# Patient Record
Sex: Female | Born: 1935
Health system: Southern US, Community
[De-identification: ages and names within clinical notes are randomized; demographics above are authoritative.]

## PROBLEM LIST (undated history)

## (undated) DIAGNOSIS — E7800 Pure hypercholesterolemia, unspecified: Secondary | ICD-10-CM

## (undated) DIAGNOSIS — H409 Unspecified glaucoma: Secondary | ICD-10-CM

## (undated) DIAGNOSIS — K922 Gastrointestinal hemorrhage, unspecified: Secondary | ICD-10-CM

## (undated) DIAGNOSIS — K219 Gastro-esophageal reflux disease without esophagitis: Secondary | ICD-10-CM

## (undated) DIAGNOSIS — I10 Essential (primary) hypertension: Secondary | ICD-10-CM

## (undated) HISTORY — DX: Pure hypercholesterolemia, unspecified: E78.00

## (undated) HISTORY — DX: Gastrointestinal hemorrhage, unspecified: K92.2

## (undated) HISTORY — PX: ABDOMINAL HYSTERECTOMY: SHX81

## (undated) HISTORY — PX: REVISION TOTAL HIP ARTHROPLASTY: SHX766

## (undated) HISTORY — PX: JOINT REPLACEMENT: SHX530

---

## 2000-10-17 ENCOUNTER — Ambulatory Visit (HOSPITAL_COMMUNITY): Admission: RE | Admit: 2000-10-17 | Discharge: 2000-10-17 | Payer: Self-pay | Admitting: Internal Medicine

## 2000-10-17 ENCOUNTER — Encounter: Payer: Self-pay | Admitting: Internal Medicine

## 2001-02-08 ENCOUNTER — Ambulatory Visit (HOSPITAL_COMMUNITY): Admission: RE | Admit: 2001-02-08 | Discharge: 2001-02-08 | Payer: Self-pay | Admitting: Gastroenterology

## 2001-12-11 ENCOUNTER — Ambulatory Visit (HOSPITAL_COMMUNITY): Admission: RE | Admit: 2001-12-11 | Discharge: 2001-12-11 | Payer: Self-pay | Admitting: Internal Medicine

## 2001-12-11 ENCOUNTER — Encounter: Payer: Self-pay | Admitting: Internal Medicine

## 2003-07-19 ENCOUNTER — Emergency Department (HOSPITAL_COMMUNITY): Admission: EM | Admit: 2003-07-19 | Discharge: 2003-07-19 | Payer: Self-pay | Admitting: Emergency Medicine

## 2004-01-07 ENCOUNTER — Emergency Department (HOSPITAL_COMMUNITY): Admission: EM | Admit: 2004-01-07 | Discharge: 2004-01-08 | Payer: Self-pay | Admitting: Emergency Medicine

## 2004-01-14 ENCOUNTER — Emergency Department (HOSPITAL_COMMUNITY): Admission: EM | Admit: 2004-01-14 | Discharge: 2004-01-14 | Payer: Self-pay | Admitting: Emergency Medicine

## 2004-03-17 ENCOUNTER — Encounter: Admission: RE | Admit: 2004-03-17 | Discharge: 2004-03-17 | Payer: Self-pay | Admitting: Gastroenterology

## 2007-04-24 ENCOUNTER — Encounter: Admission: RE | Admit: 2007-04-24 | Discharge: 2007-04-24 | Payer: Self-pay | Admitting: Internal Medicine

## 2007-08-22 ENCOUNTER — Encounter: Admission: RE | Admit: 2007-08-22 | Discharge: 2007-08-22 | Payer: Self-pay | Admitting: Orthopedic Surgery

## 2007-12-11 ENCOUNTER — Inpatient Hospital Stay (HOSPITAL_COMMUNITY): Admission: RE | Admit: 2007-12-11 | Discharge: 2007-12-14 | Payer: Self-pay | Admitting: Orthopedic Surgery

## 2008-03-18 ENCOUNTER — Encounter: Admission: RE | Admit: 2008-03-18 | Discharge: 2008-03-18 | Payer: Self-pay | Admitting: Orthopedic Surgery

## 2008-05-08 ENCOUNTER — Inpatient Hospital Stay (HOSPITAL_COMMUNITY): Admission: RE | Admit: 2008-05-08 | Discharge: 2008-05-13 | Payer: Self-pay | Admitting: Orthopedic Surgery

## 2008-05-13 ENCOUNTER — Ambulatory Visit: Payer: Self-pay | Admitting: Family Medicine

## 2008-05-13 DIAGNOSIS — E559 Vitamin D deficiency, unspecified: Secondary | ICD-10-CM | POA: Insufficient documentation

## 2008-05-13 DIAGNOSIS — K219 Gastro-esophageal reflux disease without esophagitis: Secondary | ICD-10-CM

## 2008-05-13 DIAGNOSIS — M169 Osteoarthritis of hip, unspecified: Secondary | ICD-10-CM | POA: Insufficient documentation

## 2008-05-27 ENCOUNTER — Encounter: Payer: Self-pay | Admitting: Family Medicine

## 2008-05-27 DIAGNOSIS — Z96649 Presence of unspecified artificial hip joint: Secondary | ICD-10-CM | POA: Insufficient documentation

## 2009-04-24 IMAGING — CR DG HIP COMPLETE 2+V*R*
3 series · 3 of 3 positions shown · non-contrast
Comparison: 12/11/2007

CLINICAL DATA: Right hip osteoarthritis, preop

RIGHT HIP - COMPLETE 2+ VIEW

[t pelvis a.p.]
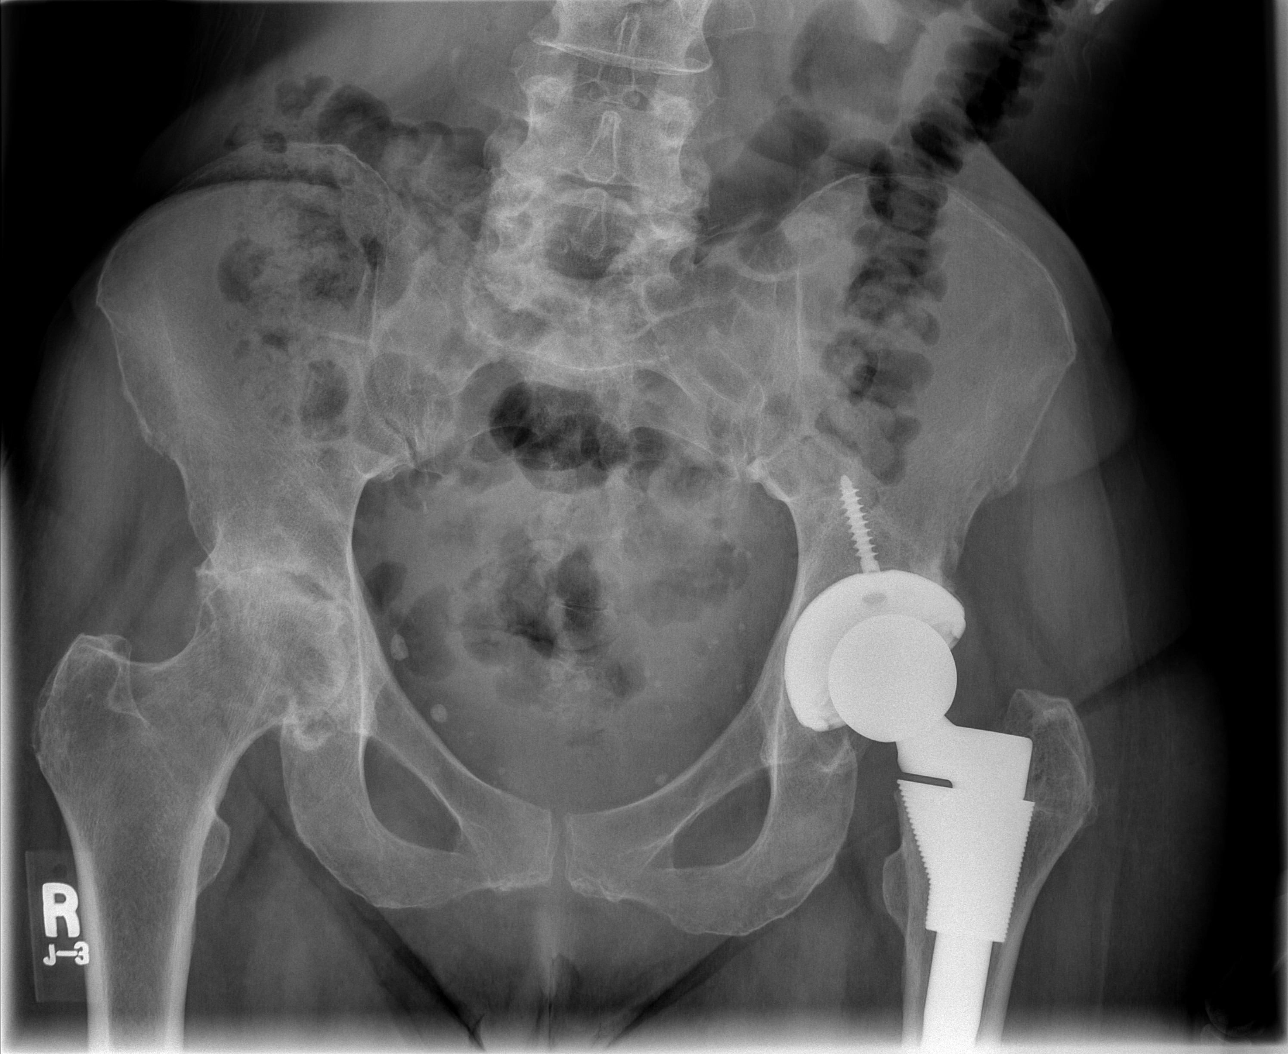

[t hip ap right]
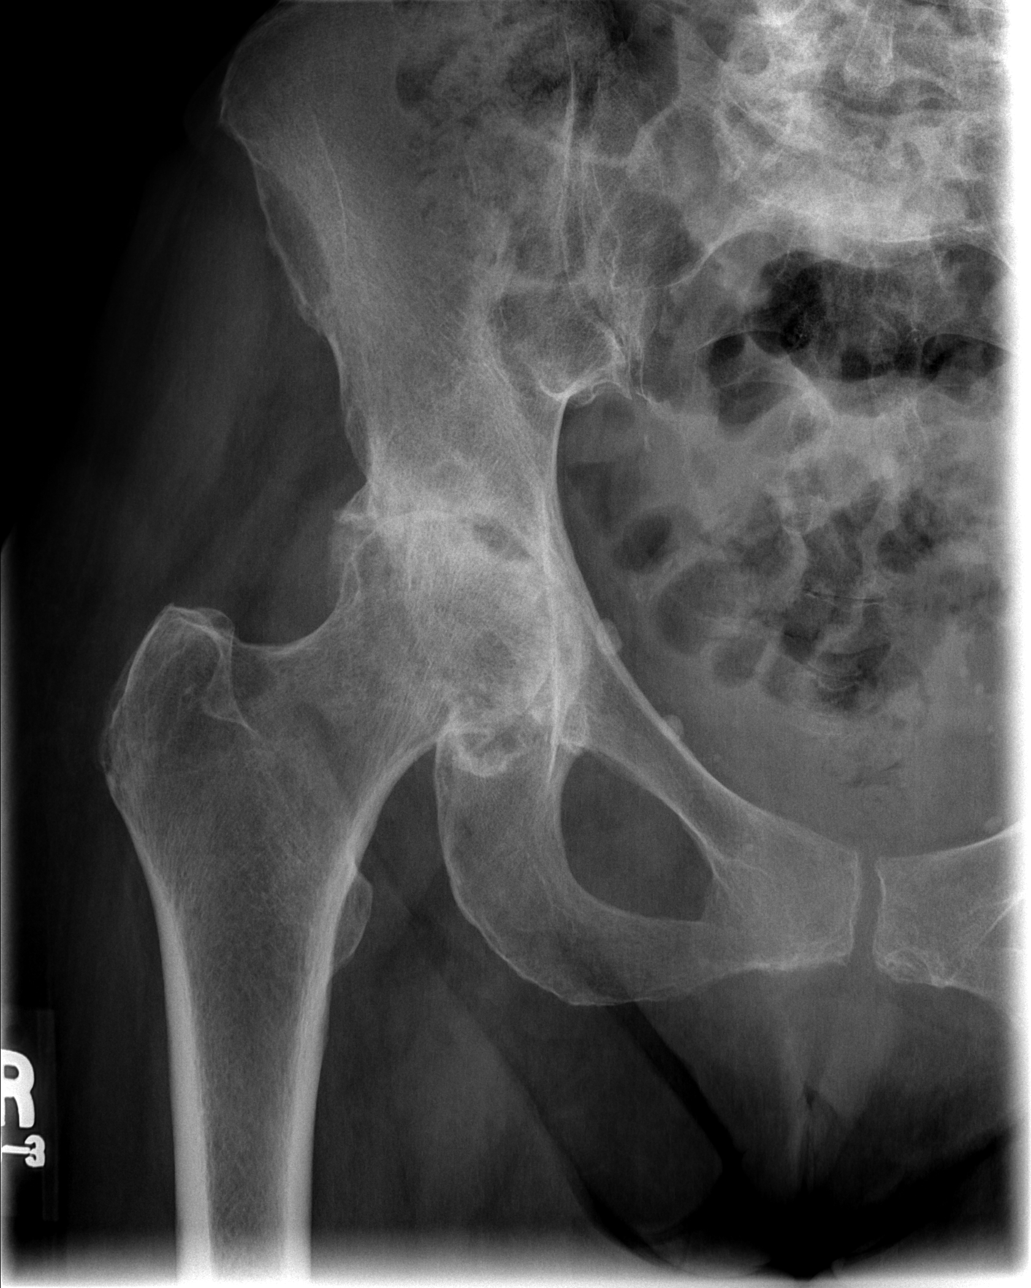

[t hip frog leg right]
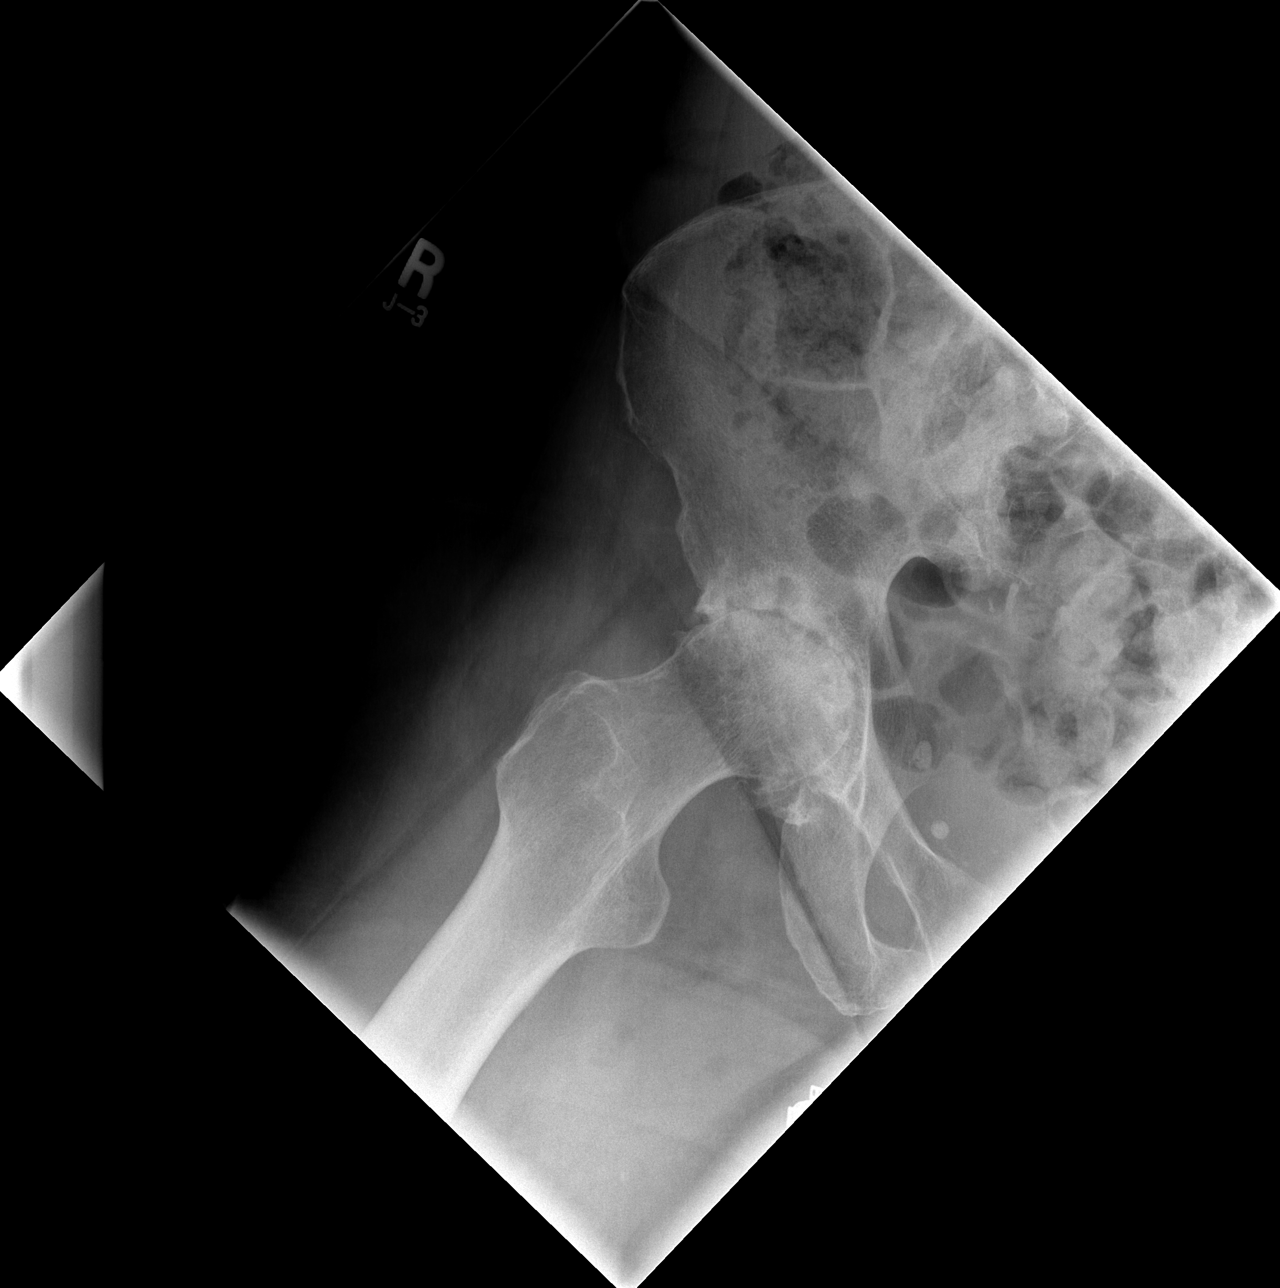

[3 of 3 positions shown; findings below may reference images not displayed]

FINDINGS: There is advanced loss of articular cartilage in the
right hip with near bone on bone apposition.  There is sclerosis in
the femoral head and acetabulum with some subchondral cysts
suggested in the femoral head.  Small marginal spurs from the
acetabulum.  Left hip arthroplasty components noted, femoral stem
incompletely visualized.  Bilateral pelvic phleboliths.  Negative
for fracture, dislocation, or other acute abnormality.  Mild
spondylitic changes in the lower lumbar spine.
IMPRESSION: 1.  Advanced right hip osteoarthritis.
2.  Left hip arthroplasty, incompletely assessed.

## 2009-08-31 ENCOUNTER — Emergency Department (HOSPITAL_COMMUNITY): Admission: EM | Admit: 2009-08-31 | Discharge: 2009-08-31 | Payer: Self-pay | Admitting: Emergency Medicine

## 2010-02-14 ENCOUNTER — Encounter: Payer: Self-pay | Admitting: Internal Medicine

## 2010-04-10 LAB — POCT CARDIAC MARKERS
Myoglobin, poc: 54 ng/mL (ref 12–200)
Troponin i, poc: <0.05 ng/mL (ref 0.00–0.09)

## 2010-04-10 LAB — CBC
HCT: 38.1 % (ref 36.0–46.0)
Hemoglobin: 12.7 g/dL (ref 12.0–15.0)
MCH: 27.5 pg (ref 26.0–34.0)
RBC: 4.62 MIL/uL (ref 3.87–5.11)
WBC: 4.9 10*3/uL (ref 4.0–10.5)

## 2010-04-10 LAB — DIFFERENTIAL
Basophils Absolute: 0 10*3/uL (ref 0.0–0.1)
Basophils Relative: 1 % (ref 0–1)
Eosinophils Absolute: 0.1 10*3/uL (ref 0.0–0.7)
Eosinophils Relative: 3 % (ref 0–5)
Lymphs Abs: 0.8 10*3/uL (ref 0.7–4.0)
Monocytes Absolute: 0.3 10*3/uL (ref 0.1–1.0)
Monocytes Relative: 6 % (ref 3–12)
Neutro Abs: 3.6 10*3/uL (ref 1.7–7.7)

## 2010-04-10 LAB — BASIC METABOLIC PANEL
BUN: 9 mg/dL (ref 6–23)
CO2: 29 mEq/L (ref 19–32)
Calcium: 9.1 mg/dL (ref 8.4–10.5)
Creatinine, Ser: 0.75 mg/dL (ref 0.4–1.2)
GFR calc non Af Amer: >60 mL/min (ref >60)
Sodium: 141 mEq/L (ref 135–145)

## 2010-05-06 LAB — BASIC METABOLIC PANEL
BUN: 3 mg/dL — ABNORMAL LOW (ref 6–23)
BUN: 4 mg/dL — ABNORMAL LOW (ref 6–23)
BUN: 7 mg/dL (ref 6–23)
CO2: 26 mEq/L (ref 19–32)
CO2: 28 mEq/L (ref 19–32)
Calcium: 8 mg/dL — ABNORMAL LOW (ref 8.4–10.5)
Calcium: 8.1 mg/dL — ABNORMAL LOW (ref 8.4–10.5)
Chloride: 108 mEq/L (ref 96–112)
Chloride: 108 mEq/L (ref 96–112)
Chloride: 102 mEq/L (ref 96–112)
Creatinine, Ser: 0.7 mg/dL (ref 0.4–1.2)
GFR calc Af Amer: >60 mL/min (ref >60)
GFR calc Af Amer: >60 mL/min (ref >60)
GFR calc non Af Amer: >60 mL/min (ref >60)
GFR calc non Af Amer: >60 mL/min (ref >60)
Glucose, Bld: 97 mg/dL (ref 70–99)
Glucose, Bld: 143 mg/dL — ABNORMAL HIGH (ref 70–99)
Potassium: 3.7 mEq/L (ref 3.5–5.1)
Sodium: 135 mEq/L (ref 135–145)
Sodium: 138 mEq/L (ref 135–145)
Sodium: 134 mEq/L — ABNORMAL LOW (ref 135–145)

## 2010-05-06 LAB — CBC
HCT: 29.4 % — ABNORMAL LOW (ref 36.0–46.0)
HCT: 25.8 % — ABNORMAL LOW (ref 36.0–46.0)
Hemoglobin: 9.9 g/dL — ABNORMAL LOW (ref 12.0–15.0)
Hemoglobin: 11.7 g/dL — ABNORMAL LOW (ref 12.0–15.0)
Hemoglobin: 8.6 g/dL — ABNORMAL LOW (ref 12.0–15.0)
MCHC: 31.8 g/dL (ref 30.0–36.0)
RBC: 3.63 MIL/uL — ABNORMAL LOW (ref 3.87–5.11)
RBC: 3.25 MIL/uL — ABNORMAL LOW (ref 3.87–5.11)
RBC: 4.64 MIL/uL (ref 3.87–5.11)
RDW: 16 % — ABNORMAL HIGH (ref 11.5–15.5)
WBC: 5.5 10*3/uL (ref 4.0–10.5)
WBC: 8 10*3/uL (ref 4.0–10.5)

## 2010-05-06 LAB — TYPE AND SCREEN
ABO/RH(D): B POS
Antibody Screen: NEGATIVE

## 2010-05-06 LAB — URINALYSIS, ROUTINE W REFLEX MICROSCOPIC
Bilirubin Urine: NEGATIVE
Glucose, UA: NEGATIVE mg/dL
Hgb urine dipstick: NEGATIVE
Protein, ur: NEGATIVE mg/dL
pH: 6.5 (ref 5.0–8.0)

## 2010-05-06 LAB — COMPREHENSIVE METABOLIC PANEL
ALT: 13 U/L (ref 0–35)
AST: 16 U/L (ref 0–37)
Alkaline Phosphatase: 81 U/L (ref 39–117)
BUN: 10 mg/dL (ref 6–23)
CO2: 29 mEq/L (ref 19–32)
Creatinine, Ser: 0.7 mg/dL (ref 0.4–1.2)
GFR calc Af Amer: >60 mL/min (ref >60)
Glucose, Bld: 97 mg/dL (ref 70–99)
Potassium: 3.3 mEq/L — ABNORMAL LOW (ref 3.5–5.1)
Sodium: 141 mEq/L (ref 135–145)
Total Protein: 6.5 g/dL (ref 6.0–8.3)

## 2010-05-06 LAB — PROTIME-INR
INR: 2.1 — ABNORMAL HIGH (ref 0.00–1.49)
INR: 2.2 — ABNORMAL HIGH (ref 0.00–1.49)
INR: 1.3 (ref 0.00–1.49)
Prothrombin Time: 25.1 seconds — ABNORMAL HIGH (ref 11.6–15.2)
Prothrombin Time: 26 seconds — ABNORMAL HIGH (ref 11.6–15.2)
Prothrombin Time: 26.7 seconds — ABNORMAL HIGH (ref 11.6–15.2)
Prothrombin Time: 13.1 seconds (ref 11.6–15.2)

## 2010-05-06 LAB — APTT: aPTT: 28 seconds (ref 24–37)

## 2010-06-09 NOTE — Discharge Summary (Signed)
Tammy, [name] NO.:  [id number]   MEDICAL RECORD NO.:  [id number]          PATIENT TYPE:  INP   LOCATION:  1618                         FACILITY:  [facility]   PHYSICIAN:  Tammy [name], M.D.    DATE OF BIRTH:  02-23-1933   DATE OF ADMISSION:  05/08/2008  DATE OF DISCHARGE:  05/13/2008                               DISCHARGE SUMMARY   ADMITTING DIAGNOSES:  1. Osteoarthritis right hip.  2. Hypertension.  3. Reflux disease.  4. Vitamin D deficiency.   DISCHARGE DIAGNOSES:  1. Osteoarthritis right hip, status post right total hip replacement      arthroplasty.  2. Acute postop blood loss anemia.  3. Status post transfusion without sequelae.  4. Mild postop hyponatremia, improved.  5. Hypertension.  6. Reflux disease.  7. Vitamin D deficiency.   PROCEDURE:  May 08, 2008:  Right total hip.  Surgeon Dr. Lequita [name],  assistant Tammy Julien Girt PA-C.  Anesthesia general.   CONSULTANTS:  None.   BRIEF HISTORY:  Tammy [name] is a 77 year old female with severe end-  stage osteoarthritis of the right hip, progressive worsening pain and  dysfunction who failed nonoperative management and now presents for  total hip arthroplasty.   LABORATORY DATA:  CBC on admission:  Hemoglobin 11.7, hematocrit 36.8,  white cell count 5.5, platelets 322.  Pro time/INR 13.1 and 1.0.  PTT of  28.  Chem panel on admission:  Minimally low potassium at 3.3.  Remaining chem panel all within normal limits.  Preop UA was negative.  Serial CBCs were followed.  Hemoglobin dropped down to 8.8, then 8.6,  then 8.1, got as low as 7.7 and was given 2 units of blood.  Post  transfusion hemoglobin back up to 9.9 with hematocrit 29.4.  Serial pro  times followed per Coumadin protocol.  Last noted PT/INR 24.6 and 2.1.  Serial basic metabolic panels were followed.  Potassium came into a  normal level and remained normal.  Sodium dropped from 141 to 134 and  back up to 138.   X-RAYS:  Right hip film,  May 01, 2008, advanced right hip  osteoarthritis and left hip arthroplasty.   EKG: Sinus bradycardia, no acute changes, was confirmed believe by Dr.  Renae [name].   HOSPITAL COURSE:  The patient was admitted to [facility],  taken to OR and underwent the above said procedure without complication.  The patient tolerated the procedure well and was later transferred to  the recovery room and then to orthopedic floor, started on PCA and p.o.  analgesics, given 24 hours of postop IV antibiotics.  Weaned over to  p.o. medications.  Had a little bit of drop in sodium so decreased the  fluids.  Blood pressure was running normal to low normal, so her blood  pressure medications were put on hold and also parameters; had decent  output.  Started to get up out of bed on day #1, weaned over to p.o.  medications.  By day #2, we had already gotten discharge planning  involved, social worker as she wanted look into a skilled nurse  facility.  She was doing a little bit better short and started walking  about 20 to 25 feet.  Dressing changed on day #2, incision looked good;  output was good.  Hemoglobin was down to 8.1.  She is asymptomatic with  this, we put her on some iron supplements.  Sodium had already started  to improve.  She was going to remain in the hospital through the  weekend. We rechecked the hemoglobin later that day.  Unfortunately it  dropped further down to 7.7.  We recommended blood.  Despite needing  blood, she was walking about 80 feet.  She wanted look into [facility]  facility.  She stayed through the weekend.  Hemoglobin rechecked on  postop day #3 was back up to 9.9, doing well.  Incision remained stable,  no signs of infection.  She received therapy on Saturday and Sunday and  on Monday morning, May 13, 2008, she was seen in rounds with Dr.  Lequita [name].  She was doing well.  Bed was available and she was transferred  out at that time.   DISCHARGE PLAN:  The patient was  transferred over to [facility].   DISCHARGE DIAGNOSES:  Please see above.   DISCHARGE MEDICATIONS:  1. Coumadin protocol.  Please titrate Coumadin level for target INR      between 2.0 and 3.0.  She is to be on Coumadin for 3 weeks from      date of surgery of May 08, 2008.  2. Colace 100 mg p.o. b.i.d.  3. Protonix 40 mg p.o. daily.  4. Norvasc 10 mg p.o. daily.  5. Azor 10/20 mg p.o. q.a.m.  6. Nu-Iron 150 mg p.o. b.i.d. for 3 weeks and then may stop the iron  7. Tylenol 325 mg 1 or 2 every 4-6 hours need for pain, mild      temperature or headache.  8. Laxative of choice.  9. Enema of choice.  10.Percocet 5 mg 1 or 2 every 4 hours needed for pain.  11.Robaxin 500 mg p.o. q.6 hours p.r.n. spasm.   DIET:  Low-sodium heart-healthy diet.   ACTIVITY:  She is partial weightbearing to the right lower extremity 25  to 50%, partial weightbearing hip precautions total hip protocol.  Gait  training, ambulation, ADLs, and needs to be up and out of bed minimum  b.i.d.  She may start showering; however do not submerge incision under  water.   DISPOSITION:  She is to be going to [place].   CONDITION ON DISCHARGE:  Improved.      Tammy [name], P.A.C.      Tammy [name], M.D.  Electronically Signed    ALP/MEDQ  D:  05/13/2008  T:  05/13/2008  Job:  [id number]   cc:   Tammy [name]. Tammy [name], M.D.  Fax: [phone]

## 2010-06-09 NOTE — H&P (Signed)
Tammy [name], [name] NO.:  [id number]   MEDICAL RECORD NO.:  [id number]        PATIENT TYPE:  LINP   LOCATION:                               FACILITY:  [facility]   PHYSICIAN:  Ollen Gross, M.D.    DATE OF BIRTH:  1933-12-26   DATE OF ADMISSION:  05/08/2008  DATE OF DISCHARGE:                              HISTORY & PHYSICAL   DATE OF OFFICE VISIT/HISTORY AND PHYSICAL:  May 03, 2008.   DATE OF ADMISSION:  May 08, 2008.   CHIEF COMPLAINT:  Right hip pain.   HISTORY OF PRESENT ILLNESS:  The patient is a 77 year old female who has  been seen by Dr. Lequita Halt for ongoing bilateral hip pain.  She has  undergone a previous left total hip back in November of 2009, doing well  with that.  The right hip continues to have pain.  She has reached the  point where she would like to have something done about it.  Risks and  benefits have been discussed.  She would like to proceed with surgery.   ALLERGIES:  CIPRO CAUSES HIVES.  UNASYN, SLEEPING PILL CAUSES LIP  TREMBLING.   INTOLERANCES:  TRAMADOL CAUSES SICKNESS.   CURRENT MEDICATIONS:  Azor, amlodipine, Premarin, calcium, Protonix.   PAST MEDICAL HISTORY:  Hypertension, reflux disease, vitamin D  deficiency.   PAST SURGICAL HISTORY:  Left total hip replacement December 11, 2007 and  also hysterectomy.   FAMILY HISTORY:  Father deceased of complications of Alzheimer's, head  injury and hypertension.  Mother living age 51 with hypertension and  congestive heart failure.   SOCIAL HISTORY:  The patient is a widow, retired, nonsmoker.  No  alcohol.  She lives alone.  She wants to look into a rehab facility.   REVIEW OF SYSTEMS:  GENERAL:  No fevers, chills, night sweats.  NEURO:  No seizures, syncope or paralysis.  RESPIRATORY:  No shortness breath,  productive cough or hemoptysis.  CARDIOVASCULAR:  No chest pain, angina,  orthopnea.  GI:  No nausea, vomiting, diarrhea, constipation.  GU:  No  dysuria, hematuria or  discharge.  MUSCULOSKELETAL:  Right hip.   PHYSICAL EXAM:  VITAL SIGNS:  Pulse 84, respirations 14, blood pressure  160/80.  GENERAL:  77 year old African American female, tall, slender frame, no  acute distress.  She is alert, oriented, cooperative, pleasant.  Good  historian.  HEENT:  Normocephalic, atraumatic.  Pupils are round and reactive.  EOMs  intact.  NECK:  Supple.  CHEST:  Clear.  HEART:  Regular rate and rhythm with a systolic ejection murmur 2/6 best  heard over tricuspid point.  ABDOMEN:  Soft, nontender.  Bowel sounds present.  RECTAL/BREASTS/GENITALIA:  Not done.  Not pertinent to present illness.  EXTREMITIES:  Right hip flexion 90 degrees, zero internal rotation, zero  external rotation, 0 degrees abduction.   IMPRESSION:  Osteoarthritis right hip.   PLAN:  The patient will be admitted to [facility] to undergo  a right total hip arthroplasty.  Surgery will be performed by Dr. Ollen Gross.      Alexzandrew L. Julien Girt,  P.A.C.      Ollen Gross, M.D.  Electronically Signed    ALP/MEDQ  D:  05/07/2008  T:  05/07/2008  Job:  [id number]   cc:   Ollen Gross, M.D.  Fax: [phone]   Merlene Laughter. Renae Gloss, M.D.  Fax: [phone]

## 2010-06-09 NOTE — Discharge Summary (Signed)
Tammy [name], Tammy [name] NO.:  [id number]   MEDICAL RECORD NO.:  [id number]          PATIENT TYPE:  INP   LOCATION:  1602                         FACILITY:  [facility]   PHYSICIAN:  Ollen Gross, M.D.    DATE OF BIRTH:  Jan 22, 1934   DATE OF ADMISSION:  12/11/2007  DATE OF DISCHARGE:  12/14/2007                               DISCHARGE SUMMARY   CHIEF COMPLAINT:  1. Osteoarthritis, left hip greater than right hip.  2. Hypertension,  3. Reflux disease.  4. Vitamin D deficiency.   DISCHARGE DIAGNOSES:  1. Osteoarthritis left hip status post left total replacement      arthroplasty.  2. Osteoarthritis right hip.  3. Mild postoperative blood loss anemia, did not require transfusion.  4. Postoperative hyponatremia, improved.  5. Postoperative hypokalemia, improved.   Remaining discharge diagnoses same as admitting.   PROCEDURE:  December 11, 2007 left total hip.   SURGEON:  Dr. Lequita Halt.   ASSISTANT:  Avel Peace, PA-C.   ANESTHESIA:  General.   CONSULTS:  None.   BRIEF HISTORY:  Tammy [name] is a 77 year old female with end-stage  arthritis of both hips, left more symptomatic than right.  Failed  nonoperative management, now presents for a total hip arthroplasty.   LABORATORY DATA:  Preop CBC showed hemoglobin 11.9, hematocrit 36.4,  white cell count 5.2, red cell count 4.3, platelets of 356.  Chem panel  on admission all within normal limits.  PT/INR preop 13.3 and 1 with a  PTT of 27.  Preop UA was negative.  Serial CBCs were followed.  Hemoglobin dropped down to 9.5 and then got as low as 8.9.  Follow-up  CBC showed hemoglobin 9.2, hematocrit 28.  Serial ProTimes followed per  Coumadin protocol, last known PT/INR 25.7 and 2.2.  Serial BMETs were  followed.  Sodium did drop down to 134, got as low as 132, came back up  to 139.  Potassium dropped a little bit down to 3.3, came back up to  4.3.  Glucose went up from 102 to 156, back down to 111.  Remaining  electrolytes remained within normal limits.   X-RAYS:  Chest X-Ray December 07, 2007:  No active disease.  Hip films  December 07, 2007:  Advanced OA bilateral hips, cannot exclude AVM.   EKG dated October 09, 2007:  Sinus bradycardia, no acute changes.   HOSPITAL COURSE:  The patient admitted to [facility],  tolerated procedure well, later transferred to recovery room on  orthopedic floor.  Started on PCA and p.o. analgesic pain control  following surgery.  Had a decent night with pain control, was doing  pretty well on the morning of day one.  Started getting up out of bed,  given 24 hours postoperative IV antibiotics.  Started on Coumadin for  DVT prophylaxis.  She had a little bit of fluid dilutional component to  her potassium and her sodium, potassium dropped to 3.3, sodium was down  to 134.  We decreased fluids, gave her a little oral potassium  supplement.  Blood pressure looked good so we put her amlodipine, blood  pressure on a parameter, resumed her on Protonix.  Started to get up out  of bed and walking short distance.  By day two she was up walking about  50 - 70 feet, doing well.  Dressing changed on day two and incision  looked excellent.  She had excellent output.  Hemoglobin was down a  little bit to 8.9 but she is asymptomatic with this.  Did put her on a  little bit of iron, the sodium was down to 132 but her potassium was  back up.  It was felt due to her home situation and progression she  would benefit from undergoing a skilled nurse facility.  She wanted look  into a bed, we got discharge planning involved.  They started looking  into a skilled facility for therapy.  By day three, December 14, 2007  the patient is doing well, tolerating her therapy, ready to go to a  skilled facility.   DISCHARGE PLAN:  The patient to be transferred to the skilled facility  of choice on December 14, 2007.   DISCHARGE DIAGNOSIS:  Please see above.   DISCHARGE  MEDICATIONS:  Current medications at time of transfer:  1. Coumadin, Coumadin protocol.  Please titrate Coumadin levels for      target INR between 2 and 3.  2. Colace 100 mg p.o. b.i.d.  3. Norvasc 10 mg p.o. daily, holding if systolic pressure less than      130.  4. Protonix 40 mg daily.  5. Nu-Iron 150 mg daily.  6. Robaxin 500 mg p.o. q.6 - 8 hours p.r.n. spasm.  7. Norco 10/325 one or two every 4 hours as needed for pain.   DIET:  Heart-healthy diet.   ACTIVITY:  She is partial weightbearing to the left lower extremity, 25%  - 50% partial weightbearing by hip precautions, total hip protocol.  She  is to continue with PT and OT for gait training, ambulation, ADLs.  She  may start showering, however, do not submerge the incision under water.   FOLLOW UP:  She is followed with Dr. Deri Fuelling office approximately 2  weeks from the date of surgery of December 11, 2007, please contact the  office at [phone] to arrange appointment time and follow-up with this  patient.   DISPOSITION:  Pending awaiting final bed offers.   CONDITION ON DISCHARGE:  Improving.      Alexzandrew L. Perkins, P.A.C.      Ollen Gross, M.D.  Electronically Signed    ALP/MEDQ  D:  12/14/2007  T:  12/14/2007  Job:  [id number]   cc:   Skilled Nursing Facility   [place] R. Renae Gloss, M.D.  Fax: [phone]   Darlin Priestly, MD  Fax: [phone]

## 2010-06-09 NOTE — Op Note (Signed)
Tammy [name], Tammy [name] NO.:  [id number]   MEDICAL RECORD NO.:  [id number]          PATIENT TYPE:  INP   LOCATION:  0006                         FACILITY:  [facility]   PHYSICIAN:  Ollen Gross, M.D.    DATE OF BIRTH:  [birth year]   DATE OF PROCEDURE:  05/08/2008  DATE OF DISCHARGE:                               OPERATIVE REPORT   PREOPERATIVE DIAGNOSIS:  Osteoarthritis, right hip.   POSTOPERATIVE DIAGNOSIS:  Osteoarthritis, right hip.   PROCEDURE:  Right total hip arthroplasty.   SURGEON:  Ollen Gross, M.D.   ASSISTANT:  Alexzandrew L. Perkins, P.A.C.   ANESTHESIA:  General.   ESTIMATED BLOOD LOSS:  400.   DRAIN:  Hemovac x1.   COMPLICATIONS:  None.   CONDITION:  Stable to recovery.   BRIEF CLINICAL NOTE:  Ms. Seyller is a 77 year old female with severe  end-stage arthritis of the right hip with progressively worsening pain  and dysfunction.  She has failed nonoperative management and presents  for right total hip arthroplasty.  She has had a recent successful left  total hip arthroplasty.   PROCEDURE IN DETAIL:  After successful administration of general  anesthetic, the patient is placed in the left lateral decubitus position  with the right side up and held with the hip positioner.  The right  lower extremity is isolated from her perineum with plastic drapes and  prepped and draped in the usual sterile fashion.  A short posterolateral  incision is made with a 10 blade through the subcutaneous tissue to the  level of the fascia lata which was incised in line with the skin  incision.  The sciatic nerve was palpated and protected and the short  external rotators were isolated off the femur.  Capsulectomy is  performed and the hip is dislocated.  The center of femoral head is  marked and a trial prosthesis placed such that the center of the trial  head corresponds to the center of the native femoral head.  Osteotomy  line is marked on the femoral neck  and osteotomy made with an  oscillating saw.  The femoral head is removed and the femur is retracted  anteriorly to gain acetabular exposure.   Acetabular retractors were placed and labrum and osteophytes removed.  Acetabular reaming is initiated at 47 mm coursing increments of 2 to 55  mm and a 56 mm Pinnacle acetabular shell was placed in anatomic position  with excellent purchase.  We did not need any additional provisional  screws.  The trial 36 mm neutral plus 4 liner was placed.   The femur is repaired with the canal finder and irrigation.  Axial  reaming is performed to 15.5 mm proximal reaming to a 44F and the sleeve  machined to a large.  A 44F large trial sleeve is placed with a 20 x 15  stem and a 36 plus 8 neck matching native anteversion.  The 36 plus zero  head is placed and the hip is reduced with outstanding stability.  There  was full extension, full external rotation, 70 degrees flexion, 40  degrees adduction and  90 degrees internal rotation and 90 degrees of  flexion and 70 degrees of internal rotation.  By placing the right leg  on top of the left, it was felt as though the leg lengths were equal.  The hip was then dislocated and all trials removed.  Permanent apex hole  eliminator is placed in the acetabular shell and permanent 36 mm neutral  plus 4 Marathon liner was placed.  On the femoral side we placed the  permanent 11F large sleeve and the 20 x 15 stem with 36 plus 8 neck  matching native anteversion.  The 36 plus zero head is placed and the  hip is reduced to the same stability parameters.  The wound is copiously  irrigated with saline solution and the short rotator is reattached to  femur through drill holes with Ethibond suture.  The fascia lata is  closed over Hemovac drain with interrupted #1 Vicryl, the subcu closed  with #1 and 2-0 Vicryl and subcuticular with running 4-0 Monocryl.  Drain is hooked to suction.  Incision cleaned and dried and  Steri-Strips  and bulky sterile dressing applied.  The drain is then hooked to suction  and she is placed into a knee immobilizer, awakened and transported to  recovery in stable condition.      Ollen Gross, M.D.  Electronically Signed     FA/MEDQ  D:  05/08/2008  T:  05/08/2008  Job:  [id number]

## 2010-06-09 NOTE — Op Note (Signed)
Tammy [name], Tammy [name] NO.:  [id number]   MEDICAL RECORD NO.:  [id number]          PATIENT TYPE:  INP   LOCATION:  0002                         FACILITY:  [facility]   PHYSICIAN:  Ollen Gross, M.D.    DATE OF BIRTH:  1933-08-23   DATE OF PROCEDURE:  12/11/2007  DATE OF DISCHARGE:                               OPERATIVE REPORT   PREOPERATIVE DIAGNOSIS:  Osteoarthritis, left hip.   POSTOPERATIVE DIAGNOSIS:  Osteoarthritis, left hip.   PROCEDURE:  Left total hip arthroplasty.   SURGEON:  Aluisio   ASSISTANT:  Avel Peace, PA-C   ANESTHESIA:  General.   ESTIMATED BLOOD LOSS:  500.   DRAINS:  Hemovac x1.   COMPLICATIONS:  None.   CONDITION:  Stable to Recovery.   BRIEF CLINICAL NOTE:  Tammy [name] is a 77 year old female with end-stage  arthritis of the left hip, with progressively worsening pain and  dysfunction.  She has failed nonoperative management and presents for  total hip arthroplasty.   PROCEDURE IN DETAIL:  After successful administration of general  anesthetic, the patient was placed in the right lateral decubitus  position with the left side up and held with the hip positioner.  The  left lower extremity was isolated from her perineum with plastic drapes,  and prepped and draped in the usual sterile fashion.  The short  posterolateral incision was then made with a 10 blade through  subcutaneous tissue to the level of the fascia lata which was incised in  line with the skin incision.  The sciatic nerve was palpated and  protected, and the short external rotator was isolated off the femur.  Capsulectomy was performed and the hip was dislocated.  The center of  femoral head was marked and the trial prosthesis was placed such that  the center of the trial head corresponded to the center of the native  femoral head.  The osteotomy line was marked on the femoral neck and  osteotomy made with an oscillating saw.  The femoral head was removed  and  then the femur was retracted anteriorly to gain acetabular exposure.   Acetabular retractors were placed the labrum and osteophytes removed.  Acetabular reaming began at 45 mm coursing in increments of 2 to 55 mm  and then a 56-mm Pinnacle acetabular shell was placed in anatomic  position and transfixed with a dome screw.  The trial 36 mm neutral +4  liner was placed.   The femur was prepared with the canal finder and irrigation.  Axial  reaming was performed to 15.5 mm, proximal reaming to a 78F, and the  sleeve machined to a large.  A 78F large trial sleeve was placed with a  20 x 15 stem and 36 mm +8 neck matching native anteversion.  A 36 mm +0  head was placed and the hip was reduced with outstanding stability.  There was full extension, full external rotation, 70 degrees flexion, 40  degrees adduction, 90 degrees external rotation, 90 degrees of flexion,  and 70 degrees of internal rotation.  By placing the left leg on top of  the right, I felt as though the leg lengths were equal.  The hip was  then dislocated and all trials were removed.  The permanent apex hole  eliminator was placed and the permanent 36 mm neutral +4 Marathon liner  was placed.  On the femoral side, we did the 20D large sleeve with a 20  x 15 stem and 36 mm +8 neck matching native anteversion.  A 36.0 head  was placed and the hips reduced with the same stability parameters.  The  wound was copiously irrigated with saline solution and short rotators  reattached to the femur through drill holes.  Fascia lata was closed  over Hemovac drain with interrupted #1 Vicryl.  The subcu was closed  with 1-0 and 2-0 Vicryl, and subcuticular running 4-0 Monocryl.  The  drain was hooked to suction.  The incision was cleaned and dried, and  Steri-Strips and a bulky sterile dressing applied.  She was placed into  a knee immobilizer, awakened, and transported to Recovery in stable  condition.      Ollen Gross, M.D.   Electronically Signed     FA/MEDQ  D:  12/11/2007  T:  12/12/2007  Job:  [id number]

## 2010-06-12 NOTE — H&P (Signed)
Tammy [name], Tammy [name] NO.:  [id number]   MEDICAL RECORD NO.:  [id number]          PATIENT TYPE:  INP   LOCATION:                               FACILITY:  [facility]   PHYSICIAN:  Ollen Gross, M.D.    DATE OF BIRTH:  April 20, 1933   DATE OF ADMISSION:  12/11/2007  DATE OF DISCHARGE:                              HISTORY & PHYSICAL   CHIEF COMPLAINT:  Bilateral hip pain.   HISTORY OF PRESENT ILLNESS:  The patient is 77 year old female with  bilateral hip pain, left significantly worse than right.  She has pain  with range of motion.  She has not noticed that she has significantly  decreased range of motion.  She has pain with weightbearing.  She is  ambulating currently with the use of a cane, it has progressed over  time.  X-ray shows she has significant arthritic changes of the left hip  and advanced arthritic changes of the right hip.  The patient has  elected to proceed with a total hip arthroplasty as intra-articular  injections have not given her any relief.   ALLERGIES:  CIPRO CAUSING HIVES.   PRIMARY CARE PHYSICIAN:  Dr. Andi Devon.   CARDIOLOGIST:  Dr. Jenne Campus.   CURRENT MEDICATIONS:  1. Amlodipine 10 mg a day.  2. Premarin 0.625 mg a day.  3. Calcium 1 or 2 tablets a day.  4. Vitamin D prescription 50,000 units once a week.  5. Protonix 40 mg a day.   PRESENT MEDICAL HISTORY:  1. Hypertension.  2. Reflux disease.  3. Vitamin D deficiency.  4. Upper and lower partial dentures.  5. Stress test 3 years previous, normal evaluation.   REVIEW OF SYSTEMS:  Negative for any neurologic or pulmonary issues.  CARDIOVASCULAR:  She has stable hypertension, she denies any chest  pains, discomfort or irregular heart rhythms.  GASTROINTESTINAL:  She is  well-controlled with her current Protonix with her reflux, no other  issues.  She denies any urinary, endocrine or hematologic problems.   PAST SURGICAL HISTORY:  Hysterectomy without any complications.   FAMILY MEDICAL HISTORY:  Father is deceased from complications of  Alzheimer's and a head injury, he did have hypertension.  Mother is  alive at 61 years of age with hypertension and CHF.   SOCIAL HISTORY:  Patient is widowed, she is retired.  She has never  smoked, no alcohol.  She lives alone in a 2 level town house.  She will  have her niece helping her at home.  She may require skilled nursing.   PHYSICAL EXAM:  VITALS:  Height is 5 foot 7, weight is 135 pounds, blood  pressure is 140/82, pulse of 74, respirations 12, patient is afebrile.  GENERAL:  This is a healthy-appearing, slender, tall female conscious,  alert and appropriate.  She does walk with a very shuffled-type gait  with a cane in her right hand.  HEENT:  Head was normocephalic, pupils equal, round and reactive; gross  hearing is intact.  NECK:  Supple, no palpable lymphadenopathy, good range of motion.  CHEST:  Lung sounds were clear  and equal bilaterally.  No wheezes,  rales, rhonchi.  HEART:  Regular rate and rhythm.  ABDOMEN:  Soft, nontender, bowel sounds present.  EXTREMITIES:  Upper extremities had excellent range of motion.  Lower  Extremities: Left hip had full extension, she was only able to flex it  up to 90 degrees.  She had about 10 degrees of internal rotation, zero  degrees of external rotation.  Right hip she was able to fully extend,  she could flex it up to about 100 degrees.  She had about 15 degrees of  internal rotation, maybe 5 degrees of external rotation.  Both knees  were fully extended, she could flex them back to 120 degrees, no  instability.  Both ankles had good motion.  PERIPHERAL VASCULAR:  Carotid pulses were 2+, no bruits.  Radial pulses  were 2+, dorsalis pedis pulses were 1+.  She had no lower extremity  edema.  NEURO:  The patient was conscious, alert and appropriate.  No gross  neurologic defects noted.   IMPRESSION:  1. 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  2.  Hypertension.  3. Reflux disease.  4. Vitamin D deficiency.  5. Partial upper and lower dentures.  6. Stress test 3 years previous, reported normal.   PLAN:  The patient has been okayed for this upcoming surgical procedure  with continuation of her current medications by Dr. Andi Devon.  The patient will undergo all routine labs and tests prior to having a  left total hip arthroplasty by Dr. Despina Hick at [facility] on  December 11, 2007.      Jamelle Rushing, P.A.      Ollen Gross, M.D.  Electronically Signed    RWK/MEDQ  D:  11/14/2007  T:  11/15/2007  Job:  [id number]

## 2010-06-12 NOTE — Procedures (Signed)
[place]  Patient:    Tammy [name], Tammy [name] Visit Number: [id number] MRN: [id number]          Service Type: END Location: ENDO Attending Physician:  Charna Elizabeth Dictated by:   Anselmo Rod, M.D. Proc. Date: 02/08/01 Admit Date:  02/08/2001   CC:         [name] R. Renae Gloss, M.D.   Procedure Report  DATE OF BIRTH:  [birth year]  REFERRING PHYSICIAN:  Merlene Laughter. Renae Gloss, M.D.  PROCEDURE PERFORMED:  Colonoscopy.  ENDOSCOPIST:  Anselmo Rod, M.D.  INSTRUMENT USED:  Olympus video colonoscope.  INDICATIONS FOR PROCEDURE:  The patient is a 77 year old African-American female undergoing a screening colonoscopy.  The patient has had some rectal bleeding, rule out colonic polyps, masses, hemorrhoids etc.  PREPROCEDURE PREPARATION:  Informed consent was procured from the patient. The patient was fasted for eight hours prior to the procedure and prepped with a bottle of magnesium citrate and a gallon of NuLytely the night prior to the procedure.  PREPROCEDURE PHYSICAL:  The patient had stable vital signs.  Neck supple. Chest clear to auscultation.  S1, S2 regular.  Abdomen soft with normal abdominal bowel sounds.  DESCRIPTION OF PROCEDURE:  The patient was placed in the left lateral decubitus position and sedated with 50 mg of Demerol and 5 mg of Versed intravenously.  Once the patient was adequately sedated and maintained on low-flow oxygen and continuous cardiac monitoring, the Olympus video colonoscope was advanced from the rectum to the cecum without difficulty. Except for small nonbleeding internal hemorrhoids and a few early left-sided diverticula, no other abnormalities were seen.  The patient tolerated the procedure well without complications.  The appendicular orifice and ileocecal valve were clearly visualized and photographed.  No masses or polyps were seen.  IMPRESSION: 1. Few early left-sided diverticula. 2. Small  nonbleeding internal hemorrhoid.  RECOMMENDATIONS: 1. High fiber diet has been advised for the patient. 2. Outpatient follow-up in the next three to four weeks. 3. Repeat colorectal cancer screening in the next five years or earlier    unless the patient were to develop any abnormal symptoms in the interim. Dictated by:   Anselmo Rod, M.D. Attending Physician:  Charna Elizabeth DD:  02/08/01 TD:  02/08/01 Job: [id number]

## 2010-10-27 LAB — BASIC METABOLIC PANEL
Calcium: 8.2 — ABNORMAL LOW
Chloride: 103
Creatinine, Ser: 0.67
GFR calc Af Amer: >60
GFR calc Af Amer: >60
GFR calc non Af Amer: >60
GFR calc non Af Amer: >60
Glucose, Bld: 111 — ABNORMAL HIGH
Potassium: 3.9
Potassium: 4.3
Sodium: 132 — ABNORMAL LOW
Sodium: 134 — ABNORMAL LOW
Sodium: 139

## 2010-10-27 LAB — CBC
HCT: 28 — ABNORMAL LOW
HCT: 36.4
Hemoglobin: 9.2 — ABNORMAL LOW
Hemoglobin: 9.5 — ABNORMAL LOW
MCV: 83
MCV: 83.1
Platelets: 356
RBC: 3.19 — ABNORMAL LOW
RBC: 3.42 — ABNORMAL LOW
RBC: 4.38
RDW: 13.2
WBC: 11.6 — ABNORMAL HIGH
WBC: 5.2
WBC: 9.2
WBC: 9.4

## 2010-10-27 LAB — COMPREHENSIVE METABOLIC PANEL
AST: 17
Albumin: 3.7
Alkaline Phosphatase: 85
BUN: 9
CO2: 30
Chloride: 104
GFR calc Af Amer: >60
GFR calc non Af Amer: >60
Potassium: 3.6
Total Bilirubin: 0.5

## 2010-10-27 LAB — ABO/RH: ABO/RH(D): B POS

## 2010-10-27 LAB — URINALYSIS, ROUTINE W REFLEX MICROSCOPIC
Glucose, UA: NEGATIVE
Nitrite: NEGATIVE
Specific Gravity, Urine: 1.019
pH: 6.5

## 2010-10-27 LAB — TYPE AND SCREEN

## 2010-10-27 LAB — PROTIME-INR
INR: 1.1
Prothrombin Time: 21 — ABNORMAL HIGH

## 2011-02-23 DIAGNOSIS — Z79899 Other long term (current) drug therapy: Secondary | ICD-10-CM | POA: Diagnosis not present

## 2011-02-23 DIAGNOSIS — K219 Gastro-esophageal reflux disease without esophagitis: Secondary | ICD-10-CM | POA: Diagnosis not present

## 2011-02-23 DIAGNOSIS — I10 Essential (primary) hypertension: Secondary | ICD-10-CM | POA: Diagnosis not present

## 2011-03-29 DIAGNOSIS — Z1231 Encounter for screening mammogram for malignant neoplasm of breast: Secondary | ICD-10-CM | POA: Diagnosis not present

## 2011-04-01 ENCOUNTER — Encounter (HOSPITAL_COMMUNITY): Payer: Self-pay | Admitting: Emergency Medicine

## 2011-04-01 ENCOUNTER — Other Ambulatory Visit: Payer: Self-pay

## 2011-04-01 ENCOUNTER — Emergency Department (HOSPITAL_COMMUNITY)
Admission: EM | Admit: 2011-04-01 | Discharge: 2011-04-01 | Disposition: A | Discharge status: Home / Self Care | Payer: Medicare Other | Attending: Emergency Medicine | Admitting: Emergency Medicine

## 2011-04-01 ENCOUNTER — Emergency Department (HOSPITAL_COMMUNITY): Payer: Medicare Other

## 2011-04-01 DIAGNOSIS — R079 Chest pain, unspecified: Secondary | ICD-10-CM | POA: Insufficient documentation

## 2011-04-01 DIAGNOSIS — R112 Nausea with vomiting, unspecified: Secondary | ICD-10-CM | POA: Diagnosis not present

## 2011-04-01 DIAGNOSIS — I517 Cardiomegaly: Secondary | ICD-10-CM | POA: Diagnosis not present

## 2011-04-01 DIAGNOSIS — I10 Essential (primary) hypertension: Secondary | ICD-10-CM | POA: Insufficient documentation

## 2011-04-01 DIAGNOSIS — R1013 Epigastric pain: Secondary | ICD-10-CM | POA: Diagnosis not present

## 2011-04-01 HISTORY — DX: Gastro-esophageal reflux disease without esophagitis: K21.9

## 2011-04-01 HISTORY — DX: Essential (primary) hypertension: I10

## 2011-04-01 LAB — URINALYSIS, ROUTINE W REFLEX MICROSCOPIC
Glucose, UA: NEGATIVE mg/dL
Hgb urine dipstick: NEGATIVE
Specific Gravity, Urine: 1.017 (ref 1.005–1.030)
pH: 7 (ref 5.0–8.0)

## 2011-04-01 LAB — COMPREHENSIVE METABOLIC PANEL
ALT: 9 U/L (ref 0–35)
AST: 15 U/L (ref 0–37)
Calcium: 9.2 mg/dL (ref 8.4–10.5)
Sodium: 138 mEq/L (ref 135–145)
Total Protein: 6.9 g/dL (ref 6.0–8.3)

## 2011-04-01 LAB — CBC
HCT: 37.9 % (ref 36.0–46.0)
MCV: 84.8 fL (ref 78.0–100.0)
Platelets: 274 10*3/uL (ref 150–400)
RBC: 4.47 MIL/uL (ref 3.87–5.11)
WBC: 4.7 10*3/uL (ref 4.0–10.5)

## 2011-04-01 LAB — CARDIAC PANEL(CRET KIN+CKTOT+MB+TROPI)
CK, MB: 1.9 ng/mL (ref 0.3–4.0)
Relative Index: INVALID (ref 0.0–2.5)
Total CK: 48 U/L (ref 7–177)

## 2011-04-01 LAB — DIFFERENTIAL
Eosinophils Relative: 1 % (ref 0–5)
Lymphocytes Relative: 18 % (ref 12–46)
Lymphs Abs: 0.9 10*3/uL (ref 0.7–4.0)
Monocytes Absolute: 0.4 10*3/uL (ref 0.1–1.0)

## 2011-04-01 MED ORDER — MORPHINE SULFATE 4 MG/ML IJ SOLN
2.0000 mg | Freq: Once | INTRAMUSCULAR | Status: DC
  Filled 2011-04-01: qty 1

## 2011-04-01 MED ORDER — SODIUM CHLORIDE 0.9 % IV BOLUS (SEPSIS)
1000.0000 mL | Freq: Once | INTRAVENOUS | Status: AC
  Administered 2011-04-01: 1000 mL via INTRAVENOUS

## 2011-04-01 MED ORDER — ONDANSETRON HCL 4 MG/2ML IJ SOLN
4.0000 mg | Freq: Once | INTRAMUSCULAR | Status: AC
  Administered 2011-04-01: 4 mg via INTRAVENOUS
  Filled 2011-04-01: qty 2

## 2011-04-01 MED ORDER — ONDANSETRON HCL 4 MG PO TABS: 4.0000 mg | ORAL_TABLET | Freq: Four times a day (QID) | ORAL | Status: AC

## 2011-04-01 MED ORDER — FAMOTIDINE IN NACL 20-0.9 MG/50ML-% IV SOLN
20.0000 mg | Freq: Once | INTRAVENOUS | Status: AC
  Administered 2011-04-01: 20 mg via INTRAVENOUS
  Filled 2011-04-01: qty 50

## 2011-04-01 NOTE — ED Notes (Signed)
Per pt nausea, diarrhea on and off for a week-generalized weakness-no abdominal pain

## 2011-04-01 NOTE — ED Notes (Signed)
Pt wishes not to take iv morphine, states she doesn't like taking pain meds-not experiencing any pain at this time-will reassess at later time

## 2011-04-01 NOTE — ED Provider Notes (Addendum)
History     CSN: [id number]  Arrival date & time 04/01/11  4098   First MD Initiated Contact with Patient 04/01/11 0809      Chief Complaint  Patient presents with  . Nausea   This is a pleasant, 78 year old female. She has a history of hypertension, and acid reflux. Her patient states she began having diarrhea and nausea. Over the weekend. This continued intermittently for the past 3-4 days. She then began having increasing nausea and epigastric discomfort for past 3 days. Yesterday. The pain radiates into the substernal area. It is nonexertional. There is no diaphoresis or dizziness. Just continues to have persistent nausea, diarrhea, and epigastric discomfort. She did not try any medications at home. She has had no sick contacts. No recent travel. No fevers. No cough or dysuria. No reported hematemesis, or melena. She's had no back pain (Consider location/radiation/quality/duration/timing/severity/associated sxs/prior treatment) HPI  Past Medical History  Diagnosis Date  . Hypertension   . Acid reflux     Past Surgical History  Procedure Date  . Abdominal hysterectomy   . Joint replacement     No family history on file.  History  Substance Use Topics  . Smoking status: Not on file  . Smokeless tobacco: Never Used  . Alcohol Use: No    OB History    Grav Para Term Preterm Abortions TAB SAB Ect Mult Living                  Review of Systems  All other systems reviewed and are negative.    Allergies  Ciprofloxacin and Zolpidem tartrate  Home Medications  No current outpatient prescriptions on file.  BP 156/77  Pulse 80  Temp(Src) 98.2 F (36.8 C) (Oral)  Resp 16  Ht 5\' 7"  (1.702 m)  Wt 146 lb (66.225 kg)  BMI 22.87 kg/m2  Physical Exam  Nursing note and vitals reviewed. Constitutional: She is oriented to person, place, and time. She appears well-developed and well-nourished. No distress.       Calm comfortable, cooperative, and jovial, no acute  distress  HENT:  Head: Normocephalic and atraumatic.  Mouth/Throat: Oropharynx is clear and moist.  Eyes: Conjunctivae and EOM are normal. Pupils are equal, round, and reactive to light.  Neck: Neck supple. No JVD present. No tracheal deviation present. No thyromegaly present.  Cardiovascular: Normal rate and regular rhythm.  Exam reveals no gallop and no friction rub.   No murmur heard. Pulmonary/Chest: Breath sounds normal. No respiratory distress. She has no wheezes. She has no rales. She exhibits no tenderness.  Abdominal: Soft. Bowel sounds are normal. She exhibits no distension. There is tenderness. There is no rebound and no guarding.       Minimal epigastric tenderness to deep palpation. No significant right upper quadrant tenderness.  Musculoskeletal: Normal range of motion. She exhibits no edema and no tenderness.       No calf tenderness or swelling  Lymphadenopathy:    She has no cervical adenopathy.  Neurological: She is alert and oriented to person, place, and time. No cranial nerve deficit. Coordination normal.  Skin: Skin is warm and dry. No rash noted. She is not diaphoretic.  Psychiatric: She has a normal mood and affect.    ED Course  Procedures (including critical care time)   Labs Reviewed  CARDIAC PANEL(CRET KIN+CKTOT+MB+TROPI)  COMPREHENSIVE METABOLIC PANEL  LIPASE, BLOOD  URINALYSIS, ROUTINE W REFLEX MICROSCOPIC   No results found.   No diagnosis found.  MDM  Patient is seen and examined, initial history and physical is completed. Evaluation initiated  Treatment for nausea and vomiting, as well as hydration.  Slight bradycardia is noted, not symptomatic.        Riely Oetken A. Patrica Duel, MD 04/01/11 0819   Date: 04/01/2011  Rate: 60  Rhythm: sinus bradycardia  QRS Axis: left  Intervals: normal  ST/T Wave abnormalities: Nonspecific ST and T changes.  Conduction Disutrbances:left anterior fascicular block  Narrative Interpretation:   Old EKG  Reviewed: none available  Results for orders placed during the hospital encounter of 08/31/09  BASIC METABOLIC PANEL      Component Value Range   Sodium 141  135 - 145 (mEq/L)   Potassium 4.4  3.5 - 5.1 (mEq/L)   Chloride 107  96 - 112 (mEq/L)   CO2 29  19 - 32 (mEq/L)   Glucose, Bld 98  70 - 99 (mg/dL)   BUN 9  6 - 23 (mg/dL)   Creatinine, Ser 1.61  0.4 - 1.2 (mg/dL)   Calcium 9.1  8.4 - 09.6 (mg/dL)   GFR calc non Af Amer >60  >60 (mL/min)   GFR calc Af Amer    >60 (mL/min)   Value: >60            The eGFR has been calculated     using the MDRD equation.     This calculation has not been     validated in all clinical     situations.     eGFR's persistently     <60 mL/min signify     possible Chronic Kidney Disease.  CBC      Component Value Range   WBC 4.9  4.0 - 10.5 (K/uL)   RBC 4.62  3.87 - 5.11 (MIL/uL)   Hemoglobin 12.7  12.0 - 15.0 (g/dL)   HCT 04.5  40.9 - 81.1 (%)   MCV 82.5  78.0 - 100.0 (fL)   MCH 27.5  26.0 - 34.0 (pg)   MCHC 33.4  30.0 - 36.0 (g/dL)   RDW 91.4  78.2 - 95.6 (%)   Platelets 278  150 - 400 (K/uL)  DIFFERENTIAL      Component Value Range   Neutrophils Relative 74  43 - 77 (%)   Neutro Abs 3.6  1.7 - 7.7 (K/uL)   Lymphocytes Relative 16  12 - 46 (%)   Lymphs Abs 0.8  0.7 - 4.0 (K/uL)   Monocytes Relative 6  3 - 12 (%)   Monocytes Absolute 0.3  0.1 - 1.0 (K/uL)   Eosinophils Relative 3  0 - 5 (%)   Eosinophils Absolute 0.1  0.0 - 0.7 (K/uL)   Basophils Relative 1  0 - 1 (%)   Basophils Absolute 0.0  0.0 - 0.1 (K/uL)  POCT CARDIAC MARKERS      Component Value Range   Myoglobin, poc 54.0  12 - 200 (ng/mL)   CKMB, poc <1.0 (*) 1.0 - 8.0 (ng/mL)   Troponin i, poc <0.05  0.00 - 0.09 (ng/mL)   Comment       Value:            TROPONIN VALUES IN THE RANGE     OF 0.00-0.09 ng/mL SHOW     NO INDICATION OF     MYOCARDIAL INJURY.                PERSISTENTLY INCREASED TROPONIN     VALUES IN THE RANGE OF 0.10-0.24  ng/mL CAN BE SEEN IN:            -UNSTABLE ANGINA           -CONGESTIVE HEART FAILURE           -MYOCARDITIS           -CHEST TRAUMA           -ARRYHTHMIAS           -LATE PRESENTING MI           -COPD       CLINICAL FOLLOW-UP RECOMMENDED.                TROPONIN VALUES >=0.25 ng/mL     INDICATE POSSIBLE MYOCARDIAL     ISCHEMIA. SERIAL TESTING     RECOMMENDED.   Dg Chest 2 View  04/01/2011  *RADIOLOGY REPORT*  Clinical Data: Epigastric and chest pain.  CHEST - 2 VIEW 04/01/2011:  Comparison: Portable chest x-ray 08/31/2009 and two-view chest x- ray 12/07/2007 [facility].  Findings: Cardiac silhouette moderately enlarged, with interval increase in size since 2009.  Thoracic aorta mildly tortuous and atherosclerotic, unchanged.  Hilar and mediastinal contours otherwise unremarkable.  Lungs clear.  Pulmonary vascularity normal without evidence of pulmonary edema.  No pleural effusions. Visualized bony thorax intact.  IMPRESSION: Cardiomegaly.  No acute cardiopulmonary disease.  Original Report Authenticated By: Arnell Sieving, M.D.      The patient is feeling much better at this time.  Her initial laboratory studies were unremarkable. CBC was normal. Cardiac markers were normal. Urine was negative for ketones, negative for nitrite or leukocytes. Comprehensive metabolic panel essentially normal. Lipase was normal. Chest x-ray showed cardiomegaly, otherwise, no acute disease. EKG was repeated it showed sinus rhythm with a rate of approximately 50 beats per minute.  Appears to be at her baseline. Heart rate is in the 60s on the monitor. In the room during the evaluation   Date: 04/01/2011  Rate: 48  Rhythm: sinus bradycardia  QRS Axis: left  Intervals: normal  ST/T Wave abnormalities: nonspecific ST/T changes  Conduction Disutrbances:none  Narrative Interpretation:   Old EKG Reviewed: unchanged       Chabely Norby A. Patrica Duel, MD 04/01/11 1149   Patient will be given trial of fluids in diet and ambulation,  and likely will be stable for discharge home with instructions to followup with her primary care physician later this week to be rechecked.  Kazmir Oki A. Patrica Duel, MD 04/01/11 1150  Mylo Driskill A. Patrica Duel, MD 04/01/11 1156

## 2011-04-01 NOTE — Discharge Instructions (Signed)
Abdominal Pain Abdominal pain can be caused by many things. Your caregiver decides the seriousness of your pain by an examination and possibly blood tests and X-rays. Many cases can be observed and treated at home. Most abdominal pain is not caused by a disease and will probably improve without treatment. However, in many cases, more time must pass before a clear cause of the pain can be found. Before that point, it may not be known if you need more testing, or if hospitalization or surgery is needed. HOME CARE INSTRUCTIONS   Do not take laxatives unless directed by your caregiver.   Take pain medicine only as directed by your caregiver.   Only take over-the-counter or prescription medicines for pain, discomfort, or fever as directed by your caregiver.   Try a clear liquid diet (broth, tea, or water) for as long as directed by your caregiver. Slowly move to a bland diet as tolerated.  SEEK IMMEDIATE MEDICAL CARE IF:   The pain does not go away.   You have a fever.   You keep throwing up (vomiting).   The pain is felt only in portions of the abdomen. Pain in the right side could possibly be appendicitis. In an adult, pain in the left lower portion of the abdomen could be colitis or diverticulitis.   You pass bloody or black tarry stools.  MAKE SURE YOU:   Understand these instructions.   Will watch your condition.   Will get help right away if you are not doing well or get worse.  Document Released: 10/21/2004 Document Revised: 12/31/2010 Document Reviewed: 08/30/2007 [facility] Patient Information 2012 [place], [place].Nausea and Vomiting Nausea means you feel sick to your stomach. Throwing up (vomiting) is a reflex where stomach contents come out of your mouth. HOME CARE   Take medicine as told by your doctor.   Do not force yourself to eat. However, you do need to drink fluids.   If you feel like eating, eat a normal diet as told by your doctor.   Eat rice, wheat, potatoes,  bread, lean meats, yogurt, fruits, and vegetables.   Avoid high-fat foods.   Drink enough fluids to keep your pee (urine) clear or pale yellow.   Ask your doctor how to replace body fluid losses (rehydrate). Signs of body fluid loss (dehydration) include:   Feeling very thirsty.   Dry lips and mouth.   Feeling dizzy.   Dark pee.   Peeing less than normal.   Feeling confused.   Fast breathing or heart rate.  GET HELP RIGHT AWAY IF:   You have blood in your throw up.   You have black or bloody poop (stool).   You have a bad headache or stiff neck.   You feel confused.   You have bad belly (abdominal) pain.   You have chest pain or trouble breathing.   You do not pee at least once every 8 hours.   You have cold, clammy skin.   You keep throwing up after 24 to 48 hours.   You have a fever.  MAKE SURE YOU:   Understand these instructions.   Will watch your condition.   Will get help right away if you are not doing well or get worse.  Document Released: 06/30/2007 Document Revised: 12/31/2010 Document Reviewed: 06/12/2010 [facility] Patient Information 2012 [place], [place].  Ondansetron tablets What is this medicine? ONDANSETRON (on DAN se tron) is used to treat nausea and vomiting caused by chemotherapy. It is also used to  prevent or treat nausea and vomiting after surgery. This medicine may be used for other purposes; ask your health care provider or pharmacist if you have questions. What should I tell my health care provider before I take this medicine? They need to know if you have any of these conditions: -heart disease -history of irregular heartbeat -liver disease -low levels of magnesium or potassium in the blood -an unusual or allergic reaction to ondansetron, granisetron, other medicines, foods, dyes, or preservatives -pregnant or trying to get pregnant -breast-feeding How should I use this medicine? Take this medicine by mouth with a glass of  water. Follow the directions on your prescription label. Take your doses at regular intervals. Do not take your medicine more often than directed. Talk to your pediatrician regarding the use of this medicine in children. Special care may be needed. Overdosage: If you think you have taken too much of this medicine contact a poison control center or emergency room at once. NOTE: This medicine is only for you. Do not share this medicine with others. What if I miss a dose? If you miss a dose, take it as soon as you can. If it is almost time for your next dose, take only that dose. Do not take double or extra doses. What may interact with this medicine? Do not take this medicine with any of the following medications: -apomorphine  This medicine may also interact with the following medications: -carbamazepine -phenytoin -rifampicin -tramadol This list may not describe all possible interactions. Give your health care provider a list of all the medicines, herbs, non-prescription drugs, or dietary supplements you use. Also tell them if you smoke, drink alcohol, or use illegal drugs. Some items may interact with your medicine. What should I watch for while using this medicine? Check with your doctor or health care professional right away if you have any sign of an allergic reaction. What side effects may I notice from receiving this medicine? Side effects that you should report to your doctor or health care professional as soon as possible: -allergic reactions like skin rash, itching or hives, swelling of the face, lips or tongue -breathing problems -dizziness -fast or irregular heartbeat -feeling faint or lightheaded, falls -fever and chills -swelling of the hands or feet -tightness in the chest Side effects that usually do not require medical attention (report to your doctor or health care professional if they continue or are bothersome): -constipation or diarrhea -headache This list may not  describe all possible side effects. Call your doctor for medical advice about side effects. You may report side effects to FDA at 1-800-FDA-1088. Where should I keep my medicine? Keep out of the reach of children. Store between 2 and 30 degrees C (36 and 86 degrees F). Throw away any unused medicine after the expiration date. NOTE: This sheet is a summary. It may not cover all possible information. If you have questions about this medicine, talk to your doctor, pharmacist, or health care provider.  2012, [employer]. (10/17/2009 11:18:31 AM)

## 2011-04-19 DIAGNOSIS — K5900 Constipation, unspecified: Secondary | ICD-10-CM | POA: Diagnosis not present

## 2011-04-19 DIAGNOSIS — K625 Hemorrhage of anus and rectum: Secondary | ICD-10-CM | POA: Diagnosis not present

## 2011-05-24 DIAGNOSIS — Z79899 Other long term (current) drug therapy: Secondary | ICD-10-CM | POA: Diagnosis not present

## 2011-05-24 DIAGNOSIS — I10 Essential (primary) hypertension: Secondary | ICD-10-CM | POA: Diagnosis not present

## 2011-06-09 DIAGNOSIS — Q150 Congenital glaucoma: Secondary | ICD-10-CM | POA: Diagnosis not present

## 2011-07-12 DIAGNOSIS — I10 Essential (primary) hypertension: Secondary | ICD-10-CM | POA: Diagnosis not present

## 2011-07-12 DIAGNOSIS — Z79899 Other long term (current) drug therapy: Secondary | ICD-10-CM | POA: Diagnosis not present

## 2011-10-12 DIAGNOSIS — Z0000 Encounter for general adult medical examination without abnormal findings: Secondary | ICD-10-CM | POA: Diagnosis not present

## 2011-10-12 DIAGNOSIS — E559 Vitamin D deficiency, unspecified: Secondary | ICD-10-CM | POA: Diagnosis not present

## 2011-10-12 DIAGNOSIS — I10 Essential (primary) hypertension: Secondary | ICD-10-CM | POA: Diagnosis not present

## 2011-10-12 DIAGNOSIS — K219 Gastro-esophageal reflux disease without esophagitis: Secondary | ICD-10-CM | POA: Diagnosis not present

## 2011-10-12 DIAGNOSIS — R7309 Other abnormal glucose: Secondary | ICD-10-CM | POA: Diagnosis not present

## 2011-10-12 DIAGNOSIS — Z79899 Other long term (current) drug therapy: Secondary | ICD-10-CM | POA: Diagnosis not present

## 2011-10-21 DIAGNOSIS — Z23 Encounter for immunization: Secondary | ICD-10-CM | POA: Diagnosis not present

## 2011-12-15 DIAGNOSIS — H409 Unspecified glaucoma: Secondary | ICD-10-CM | POA: Diagnosis not present

## 2011-12-15 DIAGNOSIS — H401290 Low-tension glaucoma, unspecified eye, stage unspecified: Secondary | ICD-10-CM | POA: Diagnosis not present

## 2011-12-15 DIAGNOSIS — H2510 Age-related nuclear cataract, unspecified eye: Secondary | ICD-10-CM | POA: Diagnosis not present

## 2012-03-29 DIAGNOSIS — Z1231 Encounter for screening mammogram for malignant neoplasm of breast: Secondary | ICD-10-CM | POA: Diagnosis not present

## 2012-04-10 DIAGNOSIS — Z79899 Other long term (current) drug therapy: Secondary | ICD-10-CM | POA: Diagnosis not present

## 2012-04-10 DIAGNOSIS — M949 Disorder of cartilage, unspecified: Secondary | ICD-10-CM | POA: Diagnosis not present

## 2012-04-10 DIAGNOSIS — M899 Disorder of bone, unspecified: Secondary | ICD-10-CM | POA: Diagnosis not present

## 2012-04-10 DIAGNOSIS — I10 Essential (primary) hypertension: Secondary | ICD-10-CM | POA: Diagnosis not present

## 2012-04-10 DIAGNOSIS — K219 Gastro-esophageal reflux disease without esophagitis: Secondary | ICD-10-CM | POA: Diagnosis not present

## 2012-05-23 DIAGNOSIS — N390 Urinary tract infection, site not specified: Secondary | ICD-10-CM | POA: Diagnosis not present

## 2012-05-23 DIAGNOSIS — I10 Essential (primary) hypertension: Secondary | ICD-10-CM | POA: Diagnosis not present

## 2012-06-14 DIAGNOSIS — H401290 Low-tension glaucoma, unspecified eye, stage unspecified: Secondary | ICD-10-CM | POA: Diagnosis not present

## 2012-06-14 DIAGNOSIS — H2510 Age-related nuclear cataract, unspecified eye: Secondary | ICD-10-CM | POA: Diagnosis not present

## 2012-06-14 DIAGNOSIS — H25019 Cortical age-related cataract, unspecified eye: Secondary | ICD-10-CM | POA: Diagnosis not present

## 2012-08-01 DIAGNOSIS — K219 Gastro-esophageal reflux disease without esophagitis: Secondary | ICD-10-CM | POA: Diagnosis not present

## 2012-10-18 DIAGNOSIS — H902 Conductive hearing loss, unspecified: Secondary | ICD-10-CM | POA: Diagnosis not present

## 2012-10-18 DIAGNOSIS — H8190 Unspecified disorder of vestibular function, unspecified ear: Secondary | ICD-10-CM | POA: Diagnosis not present

## 2012-10-18 DIAGNOSIS — H6120 Impacted cerumen, unspecified ear: Secondary | ICD-10-CM | POA: Diagnosis not present

## 2012-11-04 DIAGNOSIS — Z23 Encounter for immunization: Secondary | ICD-10-CM | POA: Diagnosis not present

## 2012-11-06 DIAGNOSIS — I10 Essential (primary) hypertension: Secondary | ICD-10-CM | POA: Diagnosis not present

## 2012-11-06 DIAGNOSIS — K219 Gastro-esophageal reflux disease without esophagitis: Secondary | ICD-10-CM | POA: Diagnosis not present

## 2012-11-06 DIAGNOSIS — R9431 Abnormal electrocardiogram [ECG] [EKG]: Secondary | ICD-10-CM | POA: Diagnosis not present

## 2012-11-06 DIAGNOSIS — E559 Vitamin D deficiency, unspecified: Secondary | ICD-10-CM | POA: Diagnosis not present

## 2012-11-06 DIAGNOSIS — R7309 Other abnormal glucose: Secondary | ICD-10-CM | POA: Diagnosis not present

## 2012-11-06 DIAGNOSIS — Z0000 Encounter for general adult medical examination without abnormal findings: Secondary | ICD-10-CM | POA: Diagnosis not present

## 2012-11-06 DIAGNOSIS — Z79899 Other long term (current) drug therapy: Secondary | ICD-10-CM | POA: Diagnosis not present

## 2012-12-11 DIAGNOSIS — H4011X0 Primary open-angle glaucoma, stage unspecified: Secondary | ICD-10-CM | POA: Diagnosis not present

## 2012-12-11 DIAGNOSIS — H409 Unspecified glaucoma: Secondary | ICD-10-CM | POA: Diagnosis not present

## 2012-12-11 DIAGNOSIS — H2510 Age-related nuclear cataract, unspecified eye: Secondary | ICD-10-CM | POA: Diagnosis not present

## 2012-12-26 DIAGNOSIS — H4011X0 Primary open-angle glaucoma, stage unspecified: Secondary | ICD-10-CM | POA: Diagnosis not present

## 2012-12-26 DIAGNOSIS — H409 Unspecified glaucoma: Secondary | ICD-10-CM | POA: Diagnosis not present

## 2013-03-28 DIAGNOSIS — I10 Essential (primary) hypertension: Secondary | ICD-10-CM | POA: Diagnosis not present

## 2013-03-28 DIAGNOSIS — R9431 Abnormal electrocardiogram [ECG] [EKG]: Secondary | ICD-10-CM | POA: Diagnosis not present

## 2013-04-03 DIAGNOSIS — Z1231 Encounter for screening mammogram for malignant neoplasm of breast: Secondary | ICD-10-CM | POA: Diagnosis not present

## 2013-04-19 DIAGNOSIS — R9431 Abnormal electrocardiogram [ECG] [EKG]: Secondary | ICD-10-CM | POA: Diagnosis not present

## 2013-04-19 DIAGNOSIS — I10 Essential (primary) hypertension: Secondary | ICD-10-CM | POA: Diagnosis not present

## 2013-05-02 DIAGNOSIS — H25019 Cortical age-related cataract, unspecified eye: Secondary | ICD-10-CM | POA: Diagnosis not present

## 2013-05-02 DIAGNOSIS — H2510 Age-related nuclear cataract, unspecified eye: Secondary | ICD-10-CM | POA: Diagnosis not present

## 2013-05-02 DIAGNOSIS — H401290 Low-tension glaucoma, unspecified eye, stage unspecified: Secondary | ICD-10-CM | POA: Diagnosis not present

## 2013-05-02 DIAGNOSIS — I10 Essential (primary) hypertension: Secondary | ICD-10-CM | POA: Diagnosis not present

## 2013-05-02 DIAGNOSIS — H409 Unspecified glaucoma: Secondary | ICD-10-CM | POA: Diagnosis not present

## 2013-05-02 DIAGNOSIS — R9431 Abnormal electrocardiogram [ECG] [EKG]: Secondary | ICD-10-CM | POA: Diagnosis not present

## 2013-05-10 DIAGNOSIS — Z79899 Other long term (current) drug therapy: Secondary | ICD-10-CM | POA: Diagnosis not present

## 2013-05-10 DIAGNOSIS — I10 Essential (primary) hypertension: Secondary | ICD-10-CM | POA: Diagnosis not present

## 2013-05-10 DIAGNOSIS — K219 Gastro-esophageal reflux disease without esophagitis: Secondary | ICD-10-CM | POA: Diagnosis not present

## 2013-06-14 DIAGNOSIS — Z9660 Presence of unspecified orthopedic joint implant: Secondary | ICD-10-CM | POA: Diagnosis not present

## 2013-06-26 DIAGNOSIS — H1130 Conjunctival hemorrhage, unspecified eye: Secondary | ICD-10-CM | POA: Diagnosis not present

## 2013-11-02 DIAGNOSIS — Z23 Encounter for immunization: Secondary | ICD-10-CM | POA: Diagnosis not present

## 2013-11-07 DIAGNOSIS — H25013 Cortical age-related cataract, bilateral: Secondary | ICD-10-CM | POA: Diagnosis not present

## 2013-11-07 DIAGNOSIS — H2513 Age-related nuclear cataract, bilateral: Secondary | ICD-10-CM | POA: Diagnosis not present

## 2013-11-07 DIAGNOSIS — H4011X1 Primary open-angle glaucoma, mild stage: Secondary | ICD-10-CM | POA: Diagnosis not present

## 2013-11-28 DIAGNOSIS — I10 Essential (primary) hypertension: Secondary | ICD-10-CM | POA: Diagnosis not present

## 2013-11-28 DIAGNOSIS — Z0000 Encounter for general adult medical examination without abnormal findings: Secondary | ICD-10-CM | POA: Diagnosis not present

## 2013-12-05 DIAGNOSIS — N951 Menopausal and female climacteric states: Secondary | ICD-10-CM | POA: Diagnosis not present

## 2013-12-05 DIAGNOSIS — I10 Essential (primary) hypertension: Secondary | ICD-10-CM | POA: Diagnosis not present

## 2013-12-05 DIAGNOSIS — Z0000 Encounter for general adult medical examination without abnormal findings: Secondary | ICD-10-CM | POA: Diagnosis not present

## 2014-03-06 DIAGNOSIS — R5383 Other fatigue: Secondary | ICD-10-CM | POA: Diagnosis not present

## 2014-03-06 DIAGNOSIS — E559 Vitamin D deficiency, unspecified: Secondary | ICD-10-CM | POA: Diagnosis not present

## 2014-03-06 DIAGNOSIS — E782 Mixed hyperlipidemia: Secondary | ICD-10-CM | POA: Diagnosis not present

## 2014-03-06 DIAGNOSIS — I10 Essential (primary) hypertension: Secondary | ICD-10-CM | POA: Diagnosis not present

## 2014-03-06 DIAGNOSIS — R7309 Other abnormal glucose: Secondary | ICD-10-CM | POA: Diagnosis not present

## 2014-03-06 DIAGNOSIS — H6123 Impacted cerumen, bilateral: Secondary | ICD-10-CM | POA: Diagnosis not present

## 2014-04-05 DIAGNOSIS — Z803 Family history of malignant neoplasm of breast: Secondary | ICD-10-CM | POA: Diagnosis not present

## 2014-04-05 DIAGNOSIS — Z1231 Encounter for screening mammogram for malignant neoplasm of breast: Secondary | ICD-10-CM | POA: Diagnosis not present

## 2014-05-15 DIAGNOSIS — H2513 Age-related nuclear cataract, bilateral: Secondary | ICD-10-CM | POA: Diagnosis not present

## 2014-05-15 DIAGNOSIS — H25013 Cortical age-related cataract, bilateral: Secondary | ICD-10-CM | POA: Diagnosis not present

## 2014-05-15 DIAGNOSIS — H4011X2 Primary open-angle glaucoma, moderate stage: Secondary | ICD-10-CM | POA: Diagnosis not present

## 2014-06-13 DIAGNOSIS — H6123 Impacted cerumen, bilateral: Secondary | ICD-10-CM | POA: Diagnosis not present

## 2014-06-13 DIAGNOSIS — R7309 Other abnormal glucose: Secondary | ICD-10-CM | POA: Diagnosis not present

## 2014-06-13 DIAGNOSIS — I10 Essential (primary) hypertension: Secondary | ICD-10-CM | POA: Diagnosis not present

## 2014-06-16 DIAGNOSIS — S30861A Insect bite (nonvenomous) of abdominal wall, initial encounter: Secondary | ICD-10-CM | POA: Diagnosis not present

## 2014-06-30 ENCOUNTER — Emergency Department (HOSPITAL_COMMUNITY)
Admission: EM | Admit: 2014-06-30 | Discharge: 2014-06-30 | Disposition: A | Discharge status: Home / Self Care | Payer: Medicare Other | Attending: Emergency Medicine | Admitting: Emergency Medicine

## 2014-06-30 ENCOUNTER — Encounter (HOSPITAL_COMMUNITY): Payer: Self-pay | Admitting: Emergency Medicine

## 2014-06-30 ENCOUNTER — Emergency Department (HOSPITAL_COMMUNITY): Payer: Medicare Other

## 2014-06-30 DIAGNOSIS — R079 Chest pain, unspecified: Secondary | ICD-10-CM | POA: Diagnosis not present

## 2014-06-30 DIAGNOSIS — I10 Essential (primary) hypertension: Secondary | ICD-10-CM | POA: Diagnosis not present

## 2014-06-30 DIAGNOSIS — K219 Gastro-esophageal reflux disease without esophagitis: Secondary | ICD-10-CM | POA: Diagnosis not present

## 2014-06-30 DIAGNOSIS — H409 Unspecified glaucoma: Secondary | ICD-10-CM | POA: Diagnosis not present

## 2014-06-30 DIAGNOSIS — Z7982 Long term (current) use of aspirin: Secondary | ICD-10-CM | POA: Diagnosis not present

## 2014-06-30 DIAGNOSIS — Z90710 Acquired absence of both cervix and uterus: Secondary | ICD-10-CM | POA: Diagnosis not present

## 2014-06-30 DIAGNOSIS — Z79899 Other long term (current) drug therapy: Secondary | ICD-10-CM | POA: Diagnosis not present

## 2014-06-30 HISTORY — DX: Unspecified glaucoma: H40.9

## 2014-06-30 LAB — APTT: aPTT: 26 seconds (ref 24–37)

## 2014-06-30 LAB — BASIC METABOLIC PANEL
Anion gap: 8 (ref 5–15)
BUN: 11 mg/dL (ref 6–20)
CALCIUM: 9.2 mg/dL (ref 8.9–10.3)
CHLORIDE: 108 mmol/L (ref 101–111)
CO2: 27 mmol/L (ref 22–32)
Creatinine, Ser: 0.83 mg/dL (ref 0.44–1.00)
GLUCOSE: 96 mg/dL (ref 65–99)
POTASSIUM: 3.6 mmol/L (ref 3.5–5.1)
Sodium: 143 mmol/L (ref 135–145)

## 2014-06-30 LAB — PROTIME-INR
INR: 1 (ref 0.00–1.49)
Prothrombin Time: 13.4 seconds (ref 11.6–15.2)

## 2014-06-30 LAB — CBC
HEMATOCRIT: 39.5 % (ref 36.0–46.0)
HEMOGLOBIN: 12.3 g/dL (ref 12.0–15.0)
MCH: 25.7 pg — ABNORMAL LOW (ref 26.0–34.0)
MCHC: 31.1 g/dL (ref 30.0–36.0)
MCV: 82.6 fL (ref 78.0–100.0)
PLATELETS: 318 10*3/uL (ref 150–400)
RBC: 4.78 MIL/uL (ref 3.87–5.11)
RDW: 14.1 % (ref 11.5–15.5)
WBC: 4.7 10*3/uL (ref 4.0–10.5)

## 2014-06-30 LAB — I-STAT TROPONIN, ED: Troponin i, poc: 0 ng/mL (ref 0.00–0.08)

## 2014-06-30 MED ORDER — SODIUM CHLORIDE 0.9 % IV SOLN
1000.0000 mL | INTRAVENOUS | Status: DC
  Administered 2014-06-30: 1000 mL via INTRAVENOUS

## 2014-06-30 MED ORDER — SUCRALFATE 1 G PO TABS: 1.0000 g | ORAL_TABLET | Freq: Three times a day (TID) | ORAL | Status: DC

## 2014-06-30 MED ORDER — GI COCKTAIL ~~LOC~~
30.0000 mL | Freq: Once | ORAL | Status: AC
  Administered 2014-06-30: 30 mL via ORAL
  Filled 2014-06-30: qty 30

## 2014-06-30 MED ORDER — ASPIRIN 81 MG PO CHEW
324.0000 mg | CHEWABLE_TABLET | Freq: Once | ORAL | Status: AC
  Administered 2014-06-30: 324 mg via ORAL
  Filled 2014-06-30: qty 4

## 2014-06-30 NOTE — ED Provider Notes (Signed)
CSN: [id number]     Arrival date & time 06/30/14  0735 History   First MD Initiated Contact with Patient 06/30/14 [phone]     Chief Complaint  Patient presents with  . Chest Pain   HPI Patient presents to the emergency room with complaints of chest discomfort that started this morning may be around 1 AM. Patient knows that it woke her up from sleep. She eventually was able to go back to bed but when she woke up this morning the symptoms persisted. It's a vague discomfort in the center of her chest. The pain does not radiate. She denies any trouble with any nausea or vomiting. No shortness of breath. She does not have any history of heart disease or pulmonary embolism. She does not smoke. No family history of coronary artery disease. Past Medical History  Diagnosis Date  . Hypertension   . Acid reflux   . Glaucoma    Past Surgical History  Procedure Laterality Date  . Abdominal hysterectomy    . Joint replacement     History reviewed. No pertinent family history. History  Substance Use Topics  . Smoking status: Never Smoker   . Smokeless tobacco: Never Used  . Alcohol Use: No   OB History    No data available     Review of Systems  All other systems reviewed and are negative.     Allergies  Ciprofloxacin and Zolpidem tartrate  Home Medications   Prior to Admission medications   Medication Sig Start Date End Date Taking? Authorizing Provider  acetaminophen (TYLENOL) 500 MG tablet Take 500 mg by mouth every 6 (six) hours as needed for mild pain.   Yes Historical Provider, MD  amLODipine-olmesartan (AZOR) 10-40 MG per tablet Take 1 tablet by mouth daily.   Yes Historical Provider, MD  aspirin EC 81 MG tablet Take 81 mg by mouth daily.   Yes Historical Provider, MD  bimatoprost (LUMIGAN) 0.03 % ophthalmic solution Place 1 drop into both eyes at bedtime.   Yes Historical Provider, MD  calcium-vitamin D (OSCAL WITH D) 500-200 MG-UNIT per tablet Take 1 tablet by mouth daily.   Yes  Historical Provider, MD  estrogens, conjugated, (PREMARIN) 0.625 MG tablet Take 0.625 mg by mouth daily.   Yes Historical Provider, MD  omeprazole (PRILOSEC) 20 MG capsule Take 20 mg by mouth daily.   Yes Historical Provider, MD  sucralfate (CARAFATE) 1 G tablet Take 1 tablet (1 g total) by mouth 4 (four) times daily -  with meals and at bedtime. 06/30/14   Linwood Dibbles, MD   BP 152/72 mmHg  Pulse 45  Temp(Src) 98.2 F (36.8 C) (Oral)  Resp 25  SpO2 98% Physical Exam  Constitutional: She appears well-developed and well-nourished. No distress.  HENT:  Head: Normocephalic and atraumatic.  Right Ear: External ear normal.  Left Ear: External ear normal.  Eyes: Conjunctivae are normal. Right eye exhibits no discharge. Left eye exhibits no discharge. No scleral icterus.  Neck: Neck supple. No tracheal deviation present.  Cardiovascular: Normal rate, regular rhythm and intact distal pulses.   Pulmonary/Chest: Effort normal and breath sounds normal. No stridor. No respiratory distress. She has no wheezes. She has no rales.  Abdominal: Soft. Bowel sounds are normal. She exhibits no distension. There is no tenderness. There is no rebound and no guarding.  Musculoskeletal: She exhibits no edema or tenderness.  Neurological: She is alert. She has normal strength. No cranial nerve deficit (no facial droop, extraocular movements intact, no  slurred speech) or sensory deficit. She exhibits normal muscle tone. She displays no seizure activity. Coordination normal.  Skin: Skin is warm and dry. No rash noted.  Psychiatric: She has a normal mood and affect.  Nursing note and vitals reviewed.   ED Course  Procedures (including critical care time) Labs Review Labs Reviewed  CBC - Abnormal; Notable for the following:    MCH 25.7 (*)    All other components within normal limits  BASIC METABOLIC PANEL  APTT  PROTIME-INR  I-STAT TROPOININ, ED  I-STAT TROPOININ, ED    Imaging Review Dg Chest 2 View (if  Patient Has Fever And/or Copd)  06/30/2014   CLINICAL DATA:  Chest pain.  EXAM: CHEST  2 VIEW  COMPARISON:  None.  FINDINGS: Normal mediastinum and cardiac silhouette. Normal pulmonary vasculature. No evidence of effusion, infiltrate, or pneumothorax. No acute bony abnormality.  IMPRESSION: No acute cardiopulmonary process.   Electronically Signed   By: Genevive BiStewart  Edmunds M.D.   On: 06/30/2014 08:42     EKG Interpretation   Date/Time:  Sunday June 30 2014 07:46:12 EDT Ventricular Rate:  59 PR Interval:  154 QRS Duration: 101 QT Interval:  446 QTC Calculation: 442 R Axis:   -49 Text Interpretation:  Sinus rhythm Probable left atrial enlargement LAD,  consider left anterior fascicular block Left ventricular hypertrophy  Anterior infarct, old No significant change since last tracing Confirmed  by Carleigh Buccieri  MD-J, Thania Woodlief ([id number]) on 06/30/2014 7:54:57 AM      MDM   Final diagnoses:  Gastroesophageal reflux disease, esophagitis presence not specified   Patient's symptoms are suggestive of gastroesophageal reflux. Patient was given a GI cocktail. On her symptoms have resolved.   Symptoms started over 8 hours ago. Her cardiac enzymes were normal. I doubt cardiac etiology.  At this time there does not appear to be any evidence of an acute emergency medical condition and the patient appears stable for discharge with appropriate outpatient follow up.     Linwood DibblesJon Jenniferann Stuckert, MD 06/30/14 1032

## 2014-06-30 NOTE — Discharge Instructions (Signed)
Gastroesophageal Reflux Disease, Adult Gastroesophageal reflux disease (GERD) happens when acid from your stomach flows up into the esophagus. When acid comes in contact with the esophagus, the acid causes soreness (inflammation) in the esophagus. Over time, GERD may create small holes (ulcers) in the lining of the esophagus. CAUSES   Increased body weight. This puts pressure on the stomach, making acid rise from the stomach into the esophagus.  Smoking. This increases acid production in the stomach.  Drinking alcohol. This causes decreased pressure in the lower esophageal sphincter (valve or ring of muscle between the esophagus and stomach), allowing acid from the stomach into the esophagus.  Late evening meals and a full stomach. This increases pressure and acid production in the stomach.  A malformed lower esophageal sphincter. Sometimes, no cause is found. SYMPTOMS   Burning pain in the lower part of the mid-chest behind the breastbone and in the mid-stomach area. This may occur twice a week or more often.  Trouble swallowing.  Sore throat.  Dry cough.  Asthma-like symptoms including chest tightness, shortness of breath, or wheezing. DIAGNOSIS  Your caregiver may be able to diagnose GERD based on your symptoms. In some cases, X-rays and other tests may be done to check for complications or to check the condition of your stomach and esophagus. TREATMENT  Your caregiver may recommend over-the-counter or prescription medicines to help decrease acid production. Ask your caregiver before starting or adding any new medicines.  HOME CARE INSTRUCTIONS   Change the factors that you can control. Ask your caregiver for guidance concerning weight loss, quitting smoking, and alcohol consumption.  Avoid foods and drinks that make your symptoms worse, such as:  Caffeine or alcoholic drinks.  Chocolate.  Peppermint or mint flavorings.  Garlic and onions.  Spicy foods.  Citrus fruits,  such as oranges, lemons, or limes.  Tomato-based foods such as sauce, chili, salsa, and pizza.  Fried and fatty foods.  Avoid lying down for the 3 hours prior to your bedtime or prior to taking a nap.  Eat small, frequent meals instead of large meals.  Wear loose-fitting clothing. Do not wear anything tight around your waist that causes pressure on your stomach.  Raise the head of your bed 6 to 8 inches with wood blocks to help you sleep. Extra pillows will not help.  Only take over-the-counter or prescription medicines for pain, discomfort, or fever as directed by your caregiver.  Do not take aspirin, ibuprofen, or other nonsteroidal anti-inflammatory drugs (NSAIDs). SEEK IMMEDIATE MEDICAL CARE IF:   You have pain in your arms, neck, jaw, teeth, or back.  Your pain increases or changes in intensity or duration.  You develop nausea, vomiting, or sweating (diaphoresis).  You develop shortness of breath, or you faint.  Your vomit is green, yellow, black, or looks like coffee grounds or blood.  Your stool is red, bloody, or black. These symptoms could be signs of other problems, such as heart disease, gastric bleeding, or esophageal bleeding. MAKE SURE YOU:   Understand these instructions.  Will watch your condition.  Will get help right away if you are not doing well or get worse. Document Released: 10/21/2004 Document Revised: 04/05/2011 Document Reviewed: 07/31/2010 Exit[employer] Patient Information 2015 [employer]. This information is not intended to replace advice given to you by your health care provider. Make sure you discuss any questions you have with your health care provider.  

## 2014-06-30 NOTE — ED Notes (Signed)
MD at bedside. 

## 2014-06-30 NOTE — ED Notes (Signed)
Pt reports central chest pain that started at 0100 today. No other symptoms.

## 2014-09-12 DIAGNOSIS — R7309 Other abnormal glucose: Secondary | ICD-10-CM | POA: Diagnosis not present

## 2014-09-12 DIAGNOSIS — I10 Essential (primary) hypertension: Secondary | ICD-10-CM | POA: Diagnosis not present

## 2014-09-12 DIAGNOSIS — E782 Mixed hyperlipidemia: Secondary | ICD-10-CM | POA: Diagnosis not present

## 2014-09-12 DIAGNOSIS — Z13820 Encounter for screening for osteoporosis: Secondary | ICD-10-CM | POA: Diagnosis not present

## 2014-10-29 DIAGNOSIS — Z23 Encounter for immunization: Secondary | ICD-10-CM | POA: Diagnosis not present

## 2014-11-01 DIAGNOSIS — M7541 Impingement syndrome of right shoulder: Secondary | ICD-10-CM | POA: Diagnosis not present

## 2014-11-01 DIAGNOSIS — M25511 Pain in right shoulder: Secondary | ICD-10-CM | POA: Diagnosis not present

## 2014-11-01 DIAGNOSIS — M7551 Bursitis of right shoulder: Secondary | ICD-10-CM | POA: Diagnosis not present

## 2014-11-01 DIAGNOSIS — M7501 Adhesive capsulitis of right shoulder: Secondary | ICD-10-CM | POA: Diagnosis not present

## 2014-11-13 DIAGNOSIS — M7541 Impingement syndrome of right shoulder: Secondary | ICD-10-CM | POA: Diagnosis not present

## 2014-11-14 DIAGNOSIS — H401132 Primary open-angle glaucoma, bilateral, moderate stage: Secondary | ICD-10-CM | POA: Diagnosis not present

## 2014-11-15 DIAGNOSIS — M7541 Impingement syndrome of right shoulder: Secondary | ICD-10-CM | POA: Diagnosis not present

## 2014-11-20 DIAGNOSIS — M7541 Impingement syndrome of right shoulder: Secondary | ICD-10-CM | POA: Diagnosis not present

## 2014-11-22 DIAGNOSIS — M7541 Impingement syndrome of right shoulder: Secondary | ICD-10-CM | POA: Diagnosis not present

## 2014-11-27 DIAGNOSIS — I10 Essential (primary) hypertension: Secondary | ICD-10-CM | POA: Diagnosis not present

## 2014-11-27 DIAGNOSIS — N951 Menopausal and female climacteric states: Secondary | ICD-10-CM | POA: Diagnosis not present

## 2014-12-02 DIAGNOSIS — M7551 Bursitis of right shoulder: Secondary | ICD-10-CM | POA: Diagnosis not present

## 2014-12-02 DIAGNOSIS — M25511 Pain in right shoulder: Secondary | ICD-10-CM | POA: Diagnosis not present

## 2014-12-02 DIAGNOSIS — M7541 Impingement syndrome of right shoulder: Secondary | ICD-10-CM | POA: Diagnosis not present

## 2014-12-02 DIAGNOSIS — M7501 Adhesive capsulitis of right shoulder: Secondary | ICD-10-CM | POA: Diagnosis not present

## 2014-12-04 DIAGNOSIS — M7541 Impingement syndrome of right shoulder: Secondary | ICD-10-CM | POA: Diagnosis not present

## 2014-12-06 DIAGNOSIS — M7541 Impingement syndrome of right shoulder: Secondary | ICD-10-CM | POA: Diagnosis not present

## 2014-12-11 DIAGNOSIS — M7541 Impingement syndrome of right shoulder: Secondary | ICD-10-CM | POA: Diagnosis not present

## 2014-12-13 DIAGNOSIS — M7541 Impingement syndrome of right shoulder: Secondary | ICD-10-CM | POA: Diagnosis not present

## 2015-01-07 DIAGNOSIS — M7551 Bursitis of right shoulder: Secondary | ICD-10-CM | POA: Diagnosis not present

## 2015-01-07 DIAGNOSIS — M25511 Pain in right shoulder: Secondary | ICD-10-CM | POA: Diagnosis not present

## 2015-01-28 DIAGNOSIS — K5730 Diverticulosis of large intestine without perforation or abscess without bleeding: Secondary | ICD-10-CM | POA: Diagnosis not present

## 2015-01-28 DIAGNOSIS — K641 Second degree hemorrhoids: Secondary | ICD-10-CM | POA: Diagnosis not present

## 2015-01-28 DIAGNOSIS — K219 Gastro-esophageal reflux disease without esophagitis: Secondary | ICD-10-CM | POA: Diagnosis not present

## 2015-02-05 DIAGNOSIS — M7501 Adhesive capsulitis of right shoulder: Secondary | ICD-10-CM | POA: Diagnosis not present

## 2015-02-05 DIAGNOSIS — M7541 Impingement syndrome of right shoulder: Secondary | ICD-10-CM | POA: Diagnosis not present

## 2015-02-05 DIAGNOSIS — M25511 Pain in right shoulder: Secondary | ICD-10-CM | POA: Diagnosis not present

## 2015-02-27 DIAGNOSIS — I10 Essential (primary) hypertension: Secondary | ICD-10-CM | POA: Diagnosis not present

## 2015-02-27 DIAGNOSIS — R0609 Other forms of dyspnea: Secondary | ICD-10-CM | POA: Diagnosis not present

## 2015-02-27 DIAGNOSIS — R7309 Other abnormal glucose: Secondary | ICD-10-CM | POA: Diagnosis not present

## 2015-02-27 DIAGNOSIS — E782 Mixed hyperlipidemia: Secondary | ICD-10-CM | POA: Diagnosis not present

## 2015-04-09 DIAGNOSIS — R0600 Dyspnea, unspecified: Secondary | ICD-10-CM | POA: Diagnosis not present

## 2015-04-09 DIAGNOSIS — R0609 Other forms of dyspnea: Secondary | ICD-10-CM | POA: Diagnosis not present

## 2015-04-13 ENCOUNTER — Emergency Department (HOSPITAL_COMMUNITY)
Admission: EM | Admit: 2015-04-13 | Discharge: 2015-04-13 | Disposition: A | Discharge status: Home / Self Care | Payer: Medicare Other | Attending: Emergency Medicine | Admitting: Emergency Medicine

## 2015-04-13 ENCOUNTER — Emergency Department (HOSPITAL_COMMUNITY): Payer: Medicare Other

## 2015-04-13 ENCOUNTER — Encounter (HOSPITAL_COMMUNITY): Payer: Self-pay | Admitting: Emergency Medicine

## 2015-04-13 DIAGNOSIS — K219 Gastro-esophageal reflux disease without esophagitis: Secondary | ICD-10-CM | POA: Diagnosis not present

## 2015-04-13 DIAGNOSIS — Z7982 Long term (current) use of aspirin: Secondary | ICD-10-CM | POA: Diagnosis not present

## 2015-04-13 DIAGNOSIS — I10 Essential (primary) hypertension: Secondary | ICD-10-CM | POA: Diagnosis not present

## 2015-04-13 DIAGNOSIS — R0789 Other chest pain: Secondary | ICD-10-CM | POA: Diagnosis not present

## 2015-04-13 DIAGNOSIS — R079 Chest pain, unspecified: Secondary | ICD-10-CM | POA: Diagnosis not present

## 2015-04-13 DIAGNOSIS — Z79899 Other long term (current) drug therapy: Secondary | ICD-10-CM | POA: Diagnosis not present

## 2015-04-13 LAB — PROTIME-INR
INR: 1.05 (ref 0.00–1.49)
PROTHROMBIN TIME: 13.9 s (ref 11.6–15.2)

## 2015-04-13 LAB — BASIC METABOLIC PANEL
ANION GAP: 8 (ref 5–15)
BUN: 8 mg/dL (ref 6–20)
CO2: 24 mmol/L (ref 22–32)
Calcium: 9.1 mg/dL (ref 8.9–10.3)
Chloride: 108 mmol/L (ref 101–111)
Creatinine, Ser: 0.81 mg/dL (ref 0.44–1.00)
Glucose, Bld: 110 mg/dL — ABNORMAL HIGH (ref 65–99)
POTASSIUM: 3.6 mmol/L (ref 3.5–5.1)
Sodium: 140 mmol/L (ref 135–145)

## 2015-04-13 LAB — CBC
HEMATOCRIT: 37.7 % (ref 36.0–46.0)
HEMOGLOBIN: 12 g/dL (ref 12.0–15.0)
MCH: 26.1 pg (ref 26.0–34.0)
MCHC: 31.8 g/dL (ref 30.0–36.0)
MCV: 82 fL (ref 78.0–100.0)
Platelets: 278 10*3/uL (ref 150–400)
RBC: 4.6 MIL/uL (ref 3.87–5.11)
RDW: 14.4 % (ref 11.5–15.5)
WBC: 5.6 10*3/uL (ref 4.0–10.5)

## 2015-04-13 LAB — I-STAT TROPONIN, ED: Troponin i, poc: 0 ng/mL (ref 0.00–0.08)

## 2015-04-13 LAB — TROPONIN I: Troponin I: <0.03 ng/mL (ref <0.031)

## 2015-04-13 MED ORDER — GI COCKTAIL ~~LOC~~
30.0000 mL | Freq: Once | ORAL | Status: AC
  Administered 2015-04-13: 30 mL via ORAL
  Filled 2015-04-13: qty 30

## 2015-04-13 NOTE — ED Provider Notes (Signed)
CSN: [id number]     Arrival date & time 04/13/15  0516 History   First MD Initiated Contact with Patient 04/13/15 0532     Chief Complaint  Patient presents with  . Chest Pain     (Consider location/radiation/quality/duration/timing/severity/associated sxs/prior Treatment) HPI Comments: Pt comes in with cc of chest pain. She is a healthy woman with hx of HTN, no hx of CAd. States that when she woke up, she noticed that she had some nausea and also a sensation that she has a lump in her throat - like food is stuck. Pt denies any exertional component or pleuritic component. She has hx of GERD, and the discomfort feels like it - but typically there is no feeling of food being "stuck" with that. She has no new numbness, tingling, focal weakness.   ROS 10 Systems reviewed and are negative for acute change except as noted in the HPI.      Patient is a 82 y.o. female presenting with chest pain. The history is provided by the patient.  Chest Pain   Past Medical History  Diagnosis Date  . Hypertension   . Acid reflux   . Glaucoma    Past Surgical History  Procedure Laterality Date  . Abdominal hysterectomy    . Joint replacement     No family history on file. Social History  Substance Use Topics  . Smoking status: Never Smoker   . Smokeless tobacco: Never Used  . Alcohol Use: No   OB History    No data available     Review of Systems  Cardiovascular: Positive for chest pain.      Allergies  Ciprofloxacin and Zolpidem tartrate  Home Medications   Prior to Admission medications   Medication Sig Start Date End Date Taking? Authorizing Provider  acetaminophen (TYLENOL) 500 MG tablet Take 500 mg by mouth every 6 (six) hours as needed for mild pain.   Yes Historical Provider, MD  amLODipine-olmesartan (AZOR) 10-40 MG per tablet Take 1 tablet by mouth daily.   Yes Historical Provider, MD  aspirin EC 81 MG tablet Take 81 mg by mouth daily.   Yes Historical Provider, MD   bimatoprost (LUMIGAN) 0.03 % ophthalmic solution Place 1 drop into both eyes at bedtime.   Yes Historical Provider, MD  calcium-vitamin D (OSCAL WITH D) 500-200 MG-UNIT per tablet Take 1 tablet by mouth daily.   Yes Historical Provider, MD  omeprazole (PRILOSEC) 20 MG capsule Take 20 mg by mouth daily.   Yes Historical Provider, MD  PARoxetine (PAXIL) 10 MG tablet Take 10 mg by mouth daily as needed (sweeting).   Yes Historical Provider, MD  PRESCRIPTION MEDICATION Take 1 tablet by mouth daily as needed (nausea).   Yes Historical Provider, MD   BP 147/65 mmHg  Pulse 53  Temp(Src) 99.1 F (37.3 C) (Oral)  Resp 16  SpO2 100% Physical Exam  Constitutional: She is oriented to person, place, and time. She appears well-developed and well-nourished.  HENT:  Head: Normocephalic and atraumatic.  Swallowing mechanism is normal  Eyes: EOM are normal. Pupils are equal, round, and reactive to light.  Neck: Neck supple. No JVD present.  Cardiovascular: Normal rate, regular rhythm and normal heart sounds.   No murmur heard. Pulmonary/Chest: Effort normal. No respiratory distress.  Abdominal: Soft. She exhibits no distension. There is no tenderness. There is no rebound and no guarding.  Neurological: She is alert and oriented to person, place, and time.  Skin: Skin is warm and  dry.  Nursing note and vitals reviewed.   ED Course  Procedures (including critical care time) Labs Review Labs Reviewed  BASIC METABOLIC PANEL - Abnormal; Notable for the following:    Glucose, Bld 110 (*)    All other components within normal limits  CBC  PROTIME-INR  TROPONIN I  Rosezena Sensor, ED    Imaging Review Dg Chest 2 View  04/13/2015  CLINICAL DATA:  Chest pain and dyspnea, onset today. Hypertension appear EXAM: CHEST  2 VIEW COMPARISON:  06/30/2014 FINDINGS: There is moderate unchanged hyperinflation. The lungs are clear. The pulmonary vasculature is normal. There is no pleural effusion. Hilar,  mediastinal and cardiac contours are unremarkable and unchanged. IMPRESSION: Hyperinflation.  No acute findings. Electronically Signed   By: Ellery Plunk M.D.   On: 04/13/2015 06:24   I have personally reviewed and evaluated these images and lab results as part of my medical decision-making.   EKG Interpretation   Date/Time:  Sunday April 13 2015 10:41:28 EDT Ventricular Rate:  52 PR Interval:  152 QRS Duration: 100 QT Interval:  509 QTC Calculation: 473 R Axis:   -49 Text Interpretation:  Sinus rhythm Left anterior fascicular block Anterior  infarct, old No significant change was found Confirmed by Manus Gunning  MD,  STEPHEN [id number]) on 04/13/2015 10:46:30 AM      MDM   Final diagnoses:  Atypical chest pain    Pt comes in with cc of chest discomfort. Although 81, pt is relatively healthy with no CAD hx. Her discomfort is described as something "impacted" in her throat - and the symptoms are coming and going.  She has no exertional component to her discomfort. Leading upto today there was no chest pain, dib with exertion.  Pt has hx of GERD, and she has had symptoms in the same area - just not as persistent. Pt given some Gi cocktain and other meds. EKG is reassuring. Initial trop is neg. Prior to me leaving, I reassessed the patient -and she reported that she has been symptom free since few minutes after we gave her meds. We had a joint discussion, and pt was agreeable to getting 2 trops and if neg seeing her pcp.  Dr. Manus Gunning to f/u.  Derwood Kaplan, MD 04/13/15 2312

## 2015-04-13 NOTE — ED Notes (Signed)
Pt in EMS reporting CP with SOB, feeling of lump in throat. Called out for same reason yesterday. Has anxiety. Afib on monitor, hx of it. A/OX4, NAD, ambulatory from stretcher to room

## 2015-04-13 NOTE — ED Notes (Signed)
Pt IV came out when she was changing into a gown. RN Delorise Jacksonori informed

## 2015-04-13 NOTE — Discharge Instructions (Signed)
Nonspecific Chest Pain  There is no evidence of heart attack. Follow up with your doctor or the cardiologist for a stress tests. Return to the ED if you develop new or worsening symptoms. Chest pain can be caused by many different conditions. There is always a chance that your pain could be related to something serious, such as a heart attack or a blood clot in your lungs. Chest pain can also be caused by conditions that are not life-threatening. If you have chest pain, it is very important to follow up with your health care provider. CAUSES  Chest pain can be caused by:  Heartburn.  Pneumonia or bronchitis.  Anxiety or stress.  Inflammation around your heart (pericarditis) or lung (pleuritis or pleurisy).  A blood clot in your lung.  A collapsed lung (pneumothorax). It can develop suddenly on its own (spontaneous pneumothorax) or from trauma to the chest.  Shingles infection (varicella-zoster virus).  Heart attack.  Damage to the bones, muscles, and cartilage that make up your chest wall. This can include:  Bruised bones due to injury.  Strained muscles or cartilage due to frequent or repeated coughing or overwork.  Fracture to one or more ribs.  Sore cartilage due to inflammation (costochondritis). RISK FACTORS  Risk factors for chest pain may include:  Activities that increase your risk for trauma or injury to your chest.  Respiratory infections or conditions that cause frequent coughing.  Medical conditions or overeating that can cause heartburn.  Heart disease or family history of heart disease.  Conditions or health behaviors that increase your risk of developing a blood clot.  Having had chicken pox (varicella zoster). SIGNS AND SYMPTOMS Chest pain can feel like:  Burning or tingling on the surface of your chest or deep in your chest.  Crushing, pressure, aching, or squeezing pain.  Dull or sharp pain that is worse when you move, cough, or take a deep  breath.  Pain that is also felt in your back, neck, shoulder, or arm, or pain that spreads to any of these areas. Your chest pain may come and go, or it may stay constant. DIAGNOSIS Lab tests or other studies may be needed to find the cause of your pain. Your health care provider may have you take a test called an ambulatory ECG (electrocardiogram). An ECG records your heartbeat patterns at the time the test is performed. You may also have other tests, such as:  Transthoracic echocardiogram (TTE). During echocardiography, sound waves are used to create a picture of all of the heart structures and to look at how blood flows through your heart.  Transesophageal echocardiogram (TEE).This is a more advanced imaging test that obtains images from inside your body. It allows your health care provider to see your heart in finer detail.  Cardiac monitoring. This allows your health care provider to monitor your heart rate and rhythm in real time.  Holter monitor. This is a portable device that records your heartbeat and can help to diagnose abnormal heartbeats. It allows your health care provider to track your heart activity for several days, if needed.  Stress tests. These can be done through exercise or by taking medicine that makes your heart beat more quickly.  Blood tests.  Imaging tests. TREATMENT  Your treatment depends on what is causing your chest pain. Treatment may include:  Medicines. These may include:  Acid blockers for heartburn.  Anti-inflammatory medicine.  Pain medicine for inflammatory conditions.  Antibiotic medicine, if an infection is present.  Medicines to dissolve blood clots.  Medicines to treat coronary artery disease.  Supportive care for conditions that do not require medicines. This may include:  Resting.  Applying heat or cold packs to injured areas.  Limiting activities until pain decreases. HOME CARE INSTRUCTIONS  If you were prescribed an  antibiotic medicine, finish it all even if you start to feel better.  Avoid any activities that bring on chest pain.  Do not use any tobacco products, including cigarettes, chewing tobacco, or electronic cigarettes. If you need help quitting, ask your health care provider.  Do not drink alcohol.  Take medicines only as directed by your health care provider.  Keep all follow-up visits as directed by your health care provider. This is important. This includes any further testing if your chest pain does not go away.  If heartburn is the cause for your chest pain, you may be told to keep your head raised (elevated) while sleeping. This reduces the chance that acid will go from your stomach into your esophagus.  Make lifestyle changes as directed by your health care provider. These may include:  Getting regular exercise. Ask your health care provider to suggest some activities that are safe for you.  Eating a heart-healthy diet. A registered dietitian can help you to learn healthy eating options.  Maintaining a healthy weight.  Managing diabetes, if necessary.  Reducing stress. SEEK MEDICAL CARE IF:  Your chest pain does not go away after treatment.  You have a rash with blisters on your chest.  You have a fever. SEEK IMMEDIATE MEDICAL CARE IF:   Your chest pain is worse.  You have an increasing cough, or you cough up blood.  You have severe abdominal pain.  You have severe weakness.  You faint.  You have chills.  You have sudden, unexplained chest discomfort.  You have sudden, unexplained discomfort in your arms, back, neck, or jaw.  You have shortness of breath at any time.  You suddenly start to sweat, or your skin gets clammy.  You feel nauseous or you vomit.  You suddenly feel light-headed or dizzy.  Your heart begins to beat quickly, or it feels like it is skipping beats. These symptoms may represent a serious problem that is an emergency. Do not wait to  see if the symptoms will go away. Get medical help right away. Call your local emergency services (911 in the [place] Do not drive yourself to the hospital.   This information is not intended to replace advice given to you by your health care provider. Make sure you discuss any questions you have with your health care provider.   Document Released: 10/21/2004 Document Revised: 02/01/2014 Document Reviewed: 08/17/2013 Elsevier Interactive Patient Education [employer].

## 2015-04-13 NOTE — ED Provider Notes (Signed)
Troponin negative 2. EKG is unchanged. Chest pain has resolved. Patient feels back to baseline. Advised follow-up with PCP for stress test may benefit from GI evaluation. Continue PPI. Stable for discharge per plan established by Dr. Rhunette CroftNanavati.  BP 148/65 mmHg  Pulse 45  Temp(Src) 98.3 F (36.8 C) (Oral)  Resp 16  SpO2 99%   Tammy OctaveStephen Udell Mazzocco, MD 04/13/15 1057

## 2015-04-13 NOTE — ED Notes (Signed)
Pt verbalizes understanding of instructions. 

## 2015-04-17 DIAGNOSIS — K219 Gastro-esophageal reflux disease without esophagitis: Secondary | ICD-10-CM | POA: Diagnosis not present

## 2015-04-17 DIAGNOSIS — Z09 Encounter for follow-up examination after completed treatment for conditions other than malignant neoplasm: Secondary | ICD-10-CM | POA: Diagnosis not present

## 2015-04-17 DIAGNOSIS — I10 Essential (primary) hypertension: Secondary | ICD-10-CM | POA: Diagnosis not present

## 2015-04-21 DIAGNOSIS — Z1231 Encounter for screening mammogram for malignant neoplasm of breast: Secondary | ICD-10-CM | POA: Diagnosis not present

## 2015-05-27 DIAGNOSIS — R928 Other abnormal and inconclusive findings on diagnostic imaging of breast: Secondary | ICD-10-CM | POA: Diagnosis not present

## 2015-05-27 DIAGNOSIS — R922 Inconclusive mammogram: Secondary | ICD-10-CM | POA: Diagnosis not present

## 2015-05-29 DIAGNOSIS — R634 Abnormal weight loss: Secondary | ICD-10-CM | POA: Diagnosis not present

## 2015-05-29 DIAGNOSIS — K5730 Diverticulosis of large intestine without perforation or abscess without bleeding: Secondary | ICD-10-CM | POA: Diagnosis not present

## 2015-05-29 DIAGNOSIS — R61 Generalized hyperhidrosis: Secondary | ICD-10-CM | POA: Diagnosis not present

## 2015-05-29 DIAGNOSIS — K219 Gastro-esophageal reflux disease without esophagitis: Secondary | ICD-10-CM | POA: Diagnosis not present

## 2015-06-04 DIAGNOSIS — R634 Abnormal weight loss: Secondary | ICD-10-CM | POA: Diagnosis not present

## 2015-06-04 DIAGNOSIS — K219 Gastro-esophageal reflux disease without esophagitis: Secondary | ICD-10-CM | POA: Diagnosis not present

## 2015-06-04 DIAGNOSIS — R110 Nausea: Secondary | ICD-10-CM | POA: Diagnosis not present

## 2015-06-06 DIAGNOSIS — R61 Generalized hyperhidrosis: Secondary | ICD-10-CM | POA: Diagnosis not present

## 2015-06-06 DIAGNOSIS — R300 Dysuria: Secondary | ICD-10-CM | POA: Diagnosis not present

## 2015-07-02 DIAGNOSIS — H401232 Low-tension glaucoma, bilateral, moderate stage: Secondary | ICD-10-CM | POA: Diagnosis not present

## 2015-07-02 DIAGNOSIS — H25013 Cortical age-related cataract, bilateral: Secondary | ICD-10-CM | POA: Diagnosis not present

## 2015-07-02 DIAGNOSIS — H2513 Age-related nuclear cataract, bilateral: Secondary | ICD-10-CM | POA: Diagnosis not present

## 2015-07-02 DIAGNOSIS — Z0100 Encounter for examination of eyes and vision without abnormal findings: Secondary | ICD-10-CM | POA: Diagnosis not present

## 2015-07-03 DIAGNOSIS — H6121 Impacted cerumen, right ear: Secondary | ICD-10-CM | POA: Diagnosis not present

## 2015-08-11 DIAGNOSIS — Z0000 Encounter for general adult medical examination without abnormal findings: Secondary | ICD-10-CM | POA: Diagnosis not present

## 2015-08-11 DIAGNOSIS — E559 Vitamin D deficiency, unspecified: Secondary | ICD-10-CM | POA: Diagnosis not present

## 2015-08-11 DIAGNOSIS — R7309 Other abnormal glucose: Secondary | ICD-10-CM | POA: Diagnosis not present

## 2015-08-11 DIAGNOSIS — R221 Localized swelling, mass and lump, neck: Secondary | ICD-10-CM | POA: Diagnosis not present

## 2015-08-11 DIAGNOSIS — I10 Essential (primary) hypertension: Secondary | ICD-10-CM | POA: Diagnosis not present

## 2015-08-12 DIAGNOSIS — Z23 Encounter for immunization: Secondary | ICD-10-CM | POA: Diagnosis not present

## 2015-08-14 ENCOUNTER — Other Ambulatory Visit: Payer: Self-pay | Admitting: Nurse Practitioner

## 2015-08-14 DIAGNOSIS — R221 Localized swelling, mass and lump, neck: Secondary | ICD-10-CM

## 2015-08-27 ENCOUNTER — Other Ambulatory Visit

## 2015-09-25 ENCOUNTER — Emergency Department (HOSPITAL_COMMUNITY)
Admission: EM | Admit: 2015-09-25 | Discharge: 2015-09-25 | Disposition: A | Discharge status: Home / Self Care | Payer: Medicare Other | Source: Home / Self Care | Attending: Emergency Medicine | Admitting: Emergency Medicine

## 2015-09-25 ENCOUNTER — Encounter (HOSPITAL_COMMUNITY): Payer: Self-pay

## 2015-09-25 DIAGNOSIS — I10 Essential (primary) hypertension: Secondary | ICD-10-CM | POA: Insufficient documentation

## 2015-09-25 DIAGNOSIS — Z79899 Other long term (current) drug therapy: Secondary | ICD-10-CM

## 2015-09-25 DIAGNOSIS — K922 Gastrointestinal hemorrhage, unspecified: Secondary | ICD-10-CM | POA: Diagnosis not present

## 2015-09-25 DIAGNOSIS — K921 Melena: Secondary | ICD-10-CM | POA: Diagnosis not present

## 2015-09-25 DIAGNOSIS — K644 Residual hemorrhoidal skin tags: Secondary | ICD-10-CM | POA: Insufficient documentation

## 2015-09-25 DIAGNOSIS — D62 Acute posthemorrhagic anemia: Secondary | ICD-10-CM | POA: Diagnosis not present

## 2015-09-25 DIAGNOSIS — Z7982 Long term (current) use of aspirin: Secondary | ICD-10-CM | POA: Insufficient documentation

## 2015-09-25 DIAGNOSIS — K625 Hemorrhage of anus and rectum: Secondary | ICD-10-CM | POA: Diagnosis not present

## 2015-09-25 DIAGNOSIS — E46 Unspecified protein-calorie malnutrition: Secondary | ICD-10-CM | POA: Diagnosis not present

## 2015-09-25 DIAGNOSIS — E876 Hypokalemia: Secondary | ICD-10-CM | POA: Diagnosis not present

## 2015-09-25 DIAGNOSIS — K645 Perianal venous thrombosis: Secondary | ICD-10-CM | POA: Diagnosis not present

## 2015-09-25 DIAGNOSIS — K5731 Diverticulosis of large intestine without perforation or abscess with bleeding: Secondary | ICD-10-CM | POA: Diagnosis not present

## 2015-09-25 LAB — CBC
HEMATOCRIT: 37.9 % (ref 36.0–46.0)
Hemoglobin: 12 g/dL (ref 12.0–15.0)
MCH: 26.3 pg (ref 26.0–34.0)
MCHC: 31.7 g/dL (ref 30.0–36.0)
MCV: 82.9 fL (ref 78.0–100.0)
Platelets: 324 10*3/uL (ref 150–400)
RBC: 4.57 MIL/uL (ref 3.87–5.11)
RDW: 14.6 % (ref 11.5–15.5)
WBC: 6.1 10*3/uL (ref 4.0–10.5)

## 2015-09-25 LAB — COMPREHENSIVE METABOLIC PANEL
ALBUMIN: 4 g/dL (ref 3.5–5.0)
ALT: 14 U/L (ref 14–54)
AST: 20 U/L (ref 15–41)
Alkaline Phosphatase: 73 U/L (ref 38–126)
Anion gap: 7 (ref 5–15)
BILIRUBIN TOTAL: 0.3 mg/dL (ref 0.3–1.2)
BUN: 16 mg/dL (ref 6–20)
CO2: 27 mmol/L (ref 22–32)
Calcium: 9.5 mg/dL (ref 8.9–10.3)
Chloride: 108 mmol/L (ref 101–111)
Creatinine, Ser: 0.86 mg/dL (ref 0.44–1.00)
GFR calc Af Amer: >60 mL/min (ref >60)
GFR calc non Af Amer: >60 mL/min (ref >60)
GLUCOSE: 114 mg/dL — AB (ref 65–99)
POTASSIUM: 3.4 mmol/L — AB (ref 3.5–5.1)
SODIUM: 142 mmol/L (ref 135–145)
TOTAL PROTEIN: 7.1 g/dL (ref 6.5–8.1)

## 2015-09-25 LAB — POC OCCULT BLOOD, ED: Fecal Occult Bld: POSITIVE — AB

## 2015-09-25 NOTE — ED Triage Notes (Signed)
PT C/O BLOODY DIARRHEA X1 1.5 HRS AGO. PT DENIES ABDOMINAL PAIN, CHEST PAIN, OR DIZZINESS, BUT STS A BURNING SENSATION TO HER VAGINA SINCE THE EPISODE. PT STS A HX OF HEMORRHOIDS.

## 2015-09-25 NOTE — Discharge Instructions (Signed)
Do not hesitate to return to the emergency department for any abdominal pain, feeling lightheaded, persistent and large volume of blood in stool, shortness of breath, heart racing.  Please follow with your gastroenterologist as soon as possible.  Please let your primary care physician know you were seen in the emergency department, they check in with them in the next week.

## 2015-09-25 NOTE — ED Provider Notes (Signed)
[facility]-EMERGENCY DEPT Provider Note   CSN: [id number] Arrival date & time: 09/25/15  1729     History   Chief Complaint Chief Complaint  Patient presents with  . Blood In Stools    X1 TODAY    HPI   Blood pressure 158/91, pulse 104, temperature 98.6 F (37 C), temperature source Oral, resp. rate 16, height 5\' 6"  (1.676 m), weight 64 kg, SpO2 97 %.  Tammy [name] is a 82 y.o. female complaining of single episode of bright red blood per rectum earlier in the day. She also had normally colored looser than normal stool, this was a single episode, it was not high volume. She states that she had a colonoscopy by Dr. Loreta Ave in the last year which was essentially normal. She denies any history of diverticulosis. States that she does have hemorrhoids but they only bother her when she is constipated and states that she has no constipation, no abdominal pain, no rectal pain on defecation. No history of GI bleeds, she takes a daily low-dose aspirin but she had 2 yesterday because she missed a day. She denies chest pain, shortness of breath, presyncopal sensation, palpitations or lightheadedness.  HPI  Past Medical History:  Diagnosis Date  . Acid reflux   . Glaucoma   . Hypertension     Patient Active Problem List   Diagnosis Date Noted  . HIP REPLACEMENT, RIGHT, HX OF 05/27/2008  . VITAMIN D DEFICIENCY 05/13/2008  . GERD 05/13/2008  . OSTEOARTHRITIS, HIPS, BILATERAL 05/13/2008    Past Surgical History:  Procedure Laterality Date  . ABDOMINAL HYSTERECTOMY    . JOINT REPLACEMENT      OB History    No data available       Home Medications    Prior to Admission medications   Medication Sig Start Date End Date Taking? Authorizing Provider  acetaminophen (TYLENOL) 500 MG tablet Take 500 mg by mouth every 6 (six) hours as needed for mild pain.   Yes Historical Provider, MD  amLODipine-olmesartan (AZOR) 10-40 MG per tablet Take 1 tablet by mouth daily.   Yes Historical Provider,  MD  aspirin EC 81 MG tablet Take 81 mg by mouth daily.   Yes Historical Provider, MD  bimatoprost (LUMIGAN) 0.03 % ophthalmic solution Place 1 drop into both eyes at bedtime.   Yes Historical Provider, MD  calcium-vitamin D (OSCAL WITH D) 500-200 MG-UNIT per tablet Take 1 tablet by mouth daily.   Yes Historical Provider, MD  Cholecalciferol (VITAMIN D3 PO) Take 1 tablet by mouth daily.   Yes Historical Provider, MD  omeprazole (PRILOSEC) 20 MG capsule Take 20 mg by mouth daily.   Yes Historical Provider, MD    Family History History reviewed. No pertinent family history.  Social History Social History  Substance Use Topics  . Smoking status: Never Smoker  . Smokeless tobacco: Never Used  . Alcohol use No     Allergies   Advil [ibuprofen]; Ciprofloxacin; and Zolpidem tartrate   Review of Systems Review of Systems  10 systems reviewed and found to be negative, except as noted in the HPI.   Physical Exam Updated Vital Signs BP 127/90 (BP Location: Left Arm)   Pulse 66   Temp 98.3 F (36.8 C) (Oral)   Resp 14   Ht 5\' 6"  (1.676 m)   Wt 64 kg   SpO2 99%   BMI 22.76 kg/m   Physical Exam  Constitutional: She is oriented to person, place, and time. She appears  well-developed and well-nourished. No distress.  HENT:  Head: Normocephalic and atraumatic.  Mouth/Throat: Oropharynx is clear and moist.  Eyes: Conjunctivae and EOM are normal. Pupils are equal, round, and reactive to light.  Neck: Normal range of motion.  Cardiovascular: Normal rate, regular rhythm and intact distal pulses.   Pulmonary/Chest: Effort normal and breath sounds normal. No respiratory distress. She has no wheezes. She has no rales. She exhibits no tenderness.  Abdominal: Soft. She exhibits no distension and no mass. There is no tenderness. There is no rebound and no guarding. No hernia.  Genitourinary:  Genitourinary Comments: Digital rectal exam with external hemorrhoids and scant bleeding dating from  the hemorrhoid.  Musculoskeletal: Normal range of motion.  Neurological: She is alert and oriented to person, place, and time.  Skin: She is not diaphoretic.  Psychiatric: She has a normal mood and affect.  Nursing note and vitals reviewed.    ED Treatments / Results  Labs (all labs ordered are listed, but only abnormal results are displayed) Labs Reviewed  COMPREHENSIVE METABOLIC PANEL - Abnormal; Notable for the following:       Result Value   Potassium 3.4 (*)    Glucose, Bld 114 (*)    All other components within normal limits  POC OCCULT BLOOD, ED - Abnormal; Notable for the following:    Fecal Occult Bld POSITIVE (*)    All other components within normal limits  CBC  TYPE AND SCREEN    EKG  EKG Interpretation None       Radiology No results found.  Procedures Procedures (including critical care time)  Medications Ordered in ED Medications - No data to display   Initial Impression / Assessment and Plan / ED Course  I have reviewed the triage vital signs and the nursing notes.  Pertinent labs & imaging results that were available during my care of the patient were reviewed by me and considered in my medical decision making (see chart for details).  Clinical Course    Vitals:   09/25/15 1747 09/25/15 2007  BP: 158/91 127/90  Pulse: 104 66  Resp: 16 14  Temp: 98.6 F (37 C) 98.3 F (36.8 C)  TempSrc: Oral Oral  SpO2: 97% 99%  Weight: 64 kg   Height: 5\' 6"  (1.676 m)     Medications - No data to display  Tammy [name] is 82 y.o. female presenting with Bloody diarrhea, patient with no signs of symptomatic anemia. Digital rectal exam shows bleeding external hemorrhoid. No stool from in the rectal vault. She had a recent colonoscopy in the last year and, as per patient, no abnormalities were seen. I think the blood is likely originating from the hemorrhoid, I doubt this is a diverticular bleed, there is no significant anemia on blood work.  This is  a shared visit with the attending physician who personally evaluated the patient and agrees with the care plan.   Evaluation does not show pathology that would require ongoing emergent intervention or inpatient treatment. Pt is hemodynamically stable and mentating appropriately. Discussed findings and plan with patient/guardian, who agrees with care plan. All questions answered. Return precautions discussed and outpatient follow up given.      Final Clinical Impressions(s) / ED Diagnoses   Final diagnoses:  Blood in stool  External hemorrhoid, bleeding      Wynetta Emeryicole Lakevia Perris, PA-C 09/25/15 2043    Jacalyn LefevreJulie Haviland, MD 09/25/15 [phone]

## 2015-09-26 ENCOUNTER — Inpatient Hospital Stay (HOSPITAL_COMMUNITY)
Admission: EM | Admit: 2015-09-26 | Discharge: 2015-10-02 | DRG: 378 | Disposition: A | Discharge status: Home / Self Care | Payer: Medicare Other | Attending: Internal Medicine | Admitting: Internal Medicine

## 2015-09-26 ENCOUNTER — Encounter (HOSPITAL_COMMUNITY): Payer: Self-pay | Admitting: Emergency Medicine

## 2015-09-26 DIAGNOSIS — K219 Gastro-esophageal reflux disease without esophagitis: Secondary | ICD-10-CM | POA: Diagnosis present

## 2015-09-26 DIAGNOSIS — D62 Acute posthemorrhagic anemia: Secondary | ICD-10-CM | POA: Diagnosis present

## 2015-09-26 DIAGNOSIS — Z6822 Body mass index (BMI) 22.0-22.9, adult: Secondary | ICD-10-CM

## 2015-09-26 DIAGNOSIS — K625 Hemorrhage of anus and rectum: Secondary | ICD-10-CM

## 2015-09-26 DIAGNOSIS — Z7982 Long term (current) use of aspirin: Secondary | ICD-10-CM

## 2015-09-26 DIAGNOSIS — E46 Unspecified protein-calorie malnutrition: Secondary | ICD-10-CM | POA: Diagnosis present

## 2015-09-26 DIAGNOSIS — D500 Iron deficiency anemia secondary to blood loss (chronic): Secondary | ICD-10-CM | POA: Diagnosis not present

## 2015-09-26 DIAGNOSIS — Z79899 Other long term (current) drug therapy: Secondary | ICD-10-CM

## 2015-09-26 DIAGNOSIS — K5730 Diverticulosis of large intestine without perforation or abscess without bleeding: Secondary | ICD-10-CM | POA: Diagnosis not present

## 2015-09-26 DIAGNOSIS — Z8249 Family history of ischemic heart disease and other diseases of the circulatory system: Secondary | ICD-10-CM

## 2015-09-26 DIAGNOSIS — I10 Essential (primary) hypertension: Secondary | ICD-10-CM | POA: Diagnosis present

## 2015-09-26 DIAGNOSIS — E876 Hypokalemia: Secondary | ICD-10-CM | POA: Diagnosis present

## 2015-09-26 DIAGNOSIS — K922 Gastrointestinal hemorrhage, unspecified: Secondary | ICD-10-CM | POA: Diagnosis not present

## 2015-09-26 DIAGNOSIS — K5731 Diverticulosis of large intestine without perforation or abscess with bleeding: Principal | ICD-10-CM | POA: Diagnosis present

## 2015-09-26 DIAGNOSIS — R58 Hemorrhage, not elsewhere classified: Secondary | ICD-10-CM | POA: Diagnosis not present

## 2015-09-26 DIAGNOSIS — H409 Unspecified glaucoma: Secondary | ICD-10-CM | POA: Diagnosis present

## 2015-09-26 HISTORY — DX: Hemorrhage of anus and rectum: K62.5

## 2015-09-26 LAB — CBC WITH DIFFERENTIAL/PLATELET
Basophils Absolute: 0 10*3/uL (ref 0.0–0.1)
Basophils Relative: 0 %
Eosinophils Absolute: 0.1 10*3/uL (ref 0.0–0.7)
Eosinophils Relative: 2 %
HEMATOCRIT: 32.9 % — AB (ref 36.0–46.0)
HEMOGLOBIN: 10.9 g/dL — AB (ref 12.0–15.0)
LYMPHS ABS: 1 10*3/uL (ref 0.7–4.0)
LYMPHS PCT: 16 %
MCH: 26.6 pg (ref 26.0–34.0)
MCHC: 33.1 g/dL (ref 30.0–36.0)
MCV: 80.2 fL (ref 78.0–100.0)
MONOS PCT: 9 %
Monocytes Absolute: 0.6 10*3/uL (ref 0.1–1.0)
NEUTROS ABS: 4.4 10*3/uL (ref 1.7–7.7)
NEUTROS PCT: 73 %
Platelets: 285 10*3/uL (ref 150–400)
RBC: 4.1 MIL/uL (ref 3.87–5.11)
RDW: 14.1 % (ref 11.5–15.5)
WBC: 6 10*3/uL (ref 4.0–10.5)

## 2015-09-26 LAB — TYPE AND SCREEN
ABO/RH(D): B POS
ANTIBODY SCREEN: NEGATIVE

## 2015-09-26 LAB — CBC
HEMATOCRIT: 25.8 % — AB (ref 36.0–46.0)
HEMOGLOBIN: 8.2 g/dL — AB (ref 12.0–15.0)
MCH: 26.3 pg (ref 26.0–34.0)
MCHC: 31.8 g/dL (ref 30.0–36.0)
MCV: 82.7 fL (ref 78.0–100.0)
Platelets: 260 10*3/uL (ref 150–400)
RBC: 3.12 MIL/uL — ABNORMAL LOW (ref 3.87–5.11)
RDW: 14.6 % (ref 11.5–15.5)
WBC: 6.8 10*3/uL (ref 4.0–10.5)

## 2015-09-26 LAB — BASIC METABOLIC PANEL
Anion gap: 6 (ref 5–15)
BUN: 14 mg/dL (ref 6–20)
CHLORIDE: 110 mmol/L (ref 101–111)
CO2: 26 mmol/L (ref 22–32)
CREATININE: 0.75 mg/dL (ref 0.44–1.00)
Calcium: 9.2 mg/dL (ref 8.9–10.3)
GFR calc Af Amer: >60 mL/min (ref >60)
GFR calc non Af Amer: >60 mL/min (ref >60)
Glucose, Bld: 110 mg/dL — ABNORMAL HIGH (ref 65–99)
POTASSIUM: 3.2 mmol/L — AB (ref 3.5–5.1)
Sodium: 142 mmol/L (ref 135–145)

## 2015-09-26 MED ORDER — SODIUM CHLORIDE 0.9 % IV SOLN
Freq: Once | INTRAVENOUS | Status: AC
  Administered 2015-09-26: 05:00:00 via INTRAVENOUS

## 2015-09-26 MED ORDER — ACETAMINOPHEN 650 MG RE SUPP: 650.0000 mg | Freq: Four times a day (QID) | RECTAL | Status: DC | PRN

## 2015-09-26 MED ORDER — ACETAMINOPHEN 325 MG PO TABS
650.0000 mg | ORAL_TABLET | Freq: Four times a day (QID) | ORAL | Status: DC | PRN
  Administered 2015-09-27 – 2015-10-01 (×3): 650 mg via ORAL
  Filled 2015-09-26 (×4): qty 2

## 2015-09-26 MED ORDER — AMLODIPINE-OLMESARTAN 10-40 MG PO TABS: 1.0000 | ORAL_TABLET | Freq: Every day | ORAL | Status: DC

## 2015-09-26 MED ORDER — ONDANSETRON HCL 4 MG/2ML IJ SOLN: 4.0000 mg | Freq: Four times a day (QID) | INTRAMUSCULAR | Status: DC | PRN

## 2015-09-26 MED ORDER — PANTOPRAZOLE SODIUM 40 MG PO TBEC
40.0000 mg | DELAYED_RELEASE_TABLET | Freq: Two times a day (BID) | ORAL | Status: DC
  Administered 2015-09-26 – 2015-10-02 (×12): 40 mg via ORAL
  Filled 2015-09-26 (×13): qty 1

## 2015-09-26 MED ORDER — IRBESARTAN 150 MG PO TABS
300.0000 mg | ORAL_TABLET | Freq: Every day | ORAL | Status: DC
  Administered 2015-09-26: 300 mg via ORAL
  Filled 2015-09-26: qty 2

## 2015-09-26 MED ORDER — POTASSIUM CHLORIDE IN NACL 20-0.9 MEQ/L-% IV SOLN
INTRAVENOUS | Status: DC
  Administered 2015-09-26 – 2015-09-27 (×3): via INTRAVENOUS
  Administered 2015-09-28: 1000 mL via INTRAVENOUS
  Administered 2015-09-30: 10:00:00 via INTRAVENOUS
  Filled 2015-09-26 (×7): qty 1000

## 2015-09-26 MED ORDER — PANTOPRAZOLE SODIUM 40 MG PO TBEC
40.0000 mg | DELAYED_RELEASE_TABLET | Freq: Every day | ORAL | Status: DC
  Administered 2015-09-26: 40 mg via ORAL
  Filled 2015-09-26: qty 1

## 2015-09-26 MED ORDER — LATANOPROST 0.005 % OP SOLN
1.0000 [drp] | Freq: Every day | OPHTHALMIC | Status: DC
  Administered 2015-09-26 – 2015-10-01 (×6): 1 [drp] via OPHTHALMIC
  Filled 2015-09-26: qty 2.5

## 2015-09-26 MED ORDER — AMLODIPINE BESYLATE 10 MG PO TABS
10.0000 mg | ORAL_TABLET | Freq: Every day | ORAL | Status: DC
  Administered 2015-09-26: 10 mg via ORAL
  Filled 2015-09-26: qty 1

## 2015-09-26 MED ORDER — ONDANSETRON HCL 4 MG PO TABS: 4.0000 mg | ORAL_TABLET | Freq: Four times a day (QID) | ORAL | Status: DC | PRN

## 2015-09-26 NOTE — Consult Note (Signed)
Reason for Consult: Hematochezia Referring Physician: [facility]  Darrelyn Hillock HPI: This is an 82 year old female with a PMH of diverticula, HTN, and GERD admitted for complaints of hematochezia.  She reports that she had the urge urinate, but she had painless hematochezia.  She was evaluated in the ER and then sent home only to represent again with hematochezia.  Her last colonoscopy with Dr. Loreta Ave 04/19/2011 with findings of pan diverticula and the procedure was performed for some complaints of bleeding.  Prior to that time her colonoscopy with Dr. Loreta Ave was in 2003 with findings of left sided diverticula.  Her HGB dropped down from a baseline of 12 to 8.2 g.dL.  No complaints of chest or SOB.  She also denies any prior history of hematochezia.  Past Medical History:  Diagnosis Date  . Acid reflux   . Glaucoma   . Hypertension     Past Surgical History:  Procedure Laterality Date  . ABDOMINAL HYSTERECTOMY    . JOINT REPLACEMENT      Family History  Problem Relation Age of Onset  . Hypertension Mother   . Hypertension Sister   . Hypertension Brother   . Heart disease Brother     Social History:  reports that she has never smoked. She has never used smokeless tobacco. She reports that she does not drink alcohol or use drugs.  Allergies:  Allergies  Allergen Reactions  . Advil [Ibuprofen] Itching  . Ciprofloxacin     REACTION: hives  . Zolpidem Tartrate     REACTION: trembling    Medications:  Scheduled: . amLODipine  10 mg Oral Daily   And  . irbesartan  300 mg Oral Daily  . pantoprazole  40 mg Oral Daily   Continuous: . 0.9 % NaCl with KCl 20 mEq / L 75 mL/hr at 09/26/15 0941    Results for orders placed or performed during the hospital encounter of 09/26/15 (from the past 24 hour(s))  CBC with Differential/Platelet     Status: Abnormal   Collection Time: 09/26/15  4:57 AM  Result Value Ref Range   WBC 6.0 4.0 - 10.5 K/uL   RBC 4.10 3.87 - 5.11 MIL/uL    Hemoglobin 10.9 (L) 12.0 - 15.0 g/dL   HCT 08.6 (L) 57.8 - 46.9 %   MCV 80.2 78.0 - 100.0 fL   MCH 26.6 26.0 - 34.0 pg   MCHC 33.1 30.0 - 36.0 g/dL   RDW 62.9 52.8 - 41.3 %   Platelets 285 150 - 400 K/uL   Neutrophils Relative % 73 %   Neutro Abs 4.4 1.7 - 7.7 K/uL   Lymphocytes Relative 16 %   Lymphs Abs 1.0 0.7 - 4.0 K/uL   Monocytes Relative 9 %   Monocytes Absolute 0.6 0.1 - 1.0 K/uL   Eosinophils Relative 2 %   Eosinophils Absolute 0.1 0.0 - 0.7 K/uL   Basophils Relative 0 %   Basophils Absolute 0.0 0.0 - 0.1 K/uL  Basic metabolic panel     Status: Abnormal   Collection Time: 09/26/15  4:57 AM  Result Value Ref Range   Sodium 142 135 - 145 mmol/L   Potassium 3.2 (L) 3.5 - 5.1 mmol/L   Chloride 110 101 - 111 mmol/L   CO2 26 22 - 32 mmol/L   Glucose, Bld 110 (H) 65 - 99 mg/dL   BUN 14 6 - 20 mg/dL   Creatinine, Ser 2.44 0.44 - 1.00 mg/dL   Calcium 9.2  8.9 - 10.3 mg/dL   GFR calc non Af Amer >60 >60 mL/min   GFR calc Af Amer >60 >60 mL/min   Anion gap 6 5 - 15  Type and screen     Status: None   Collection Time: 09/26/15  4:57 AM  Result Value Ref Range   ABO/RH(D) B POS    Antibody Screen NEG    Sample Expiration 09/29/2015      No results found.  ROS:  As stated above in the HPI otherwise negative.  Blood pressure 116/73, pulse 67, temperature 98.5 F (36.9 C), resp. rate 16, height 5\' 7"  (1.702 m), weight 66 kg (145 lb 8.1 oz), SpO2 100 %.    PE: Gen: NAD, Alert and Oriented HEENT:  [place]/AT, EOMI Neck: Supple, no LAD Lungs: CTA Bilaterally CV: RRR without M/G/R ABM: Soft, NTND, +BS Ext: No C/C/E  Assessment/Plan: 1) Diverticular bleed. 2) Anemia. 3) Hematochezia.   The patient's clinical presentation is consistent with a diverticular bleed.  She is hemodynamically stable.  Typically the bleeding will stop on its own.  If it continues to persist a bleeding scan will be required and possible IR/Surgical intervention will be required.  This is if she has  a large transfusion requirement over a short period of time.  Plan: 1) Follow HGB. 2) Transfuse as necessary.  Daleiza Bacchi D 09/26/2015, 1:39 PM

## 2015-09-26 NOTE — ED Provider Notes (Addendum)
[facility]ER[facility] DEPT Provider Note   CSN: [id number] Arrival date & time: 09/26/15  0351     History   Chief Complaint Chief Complaint  Patient presents with  . Rectal Bleeding    HPI Tammy [name] is a 82 y.o. female who was seen yesterday for rectal bleeding. The bleeding began yesterday morning about 8 AM and was not severe at that time. She was diagnosed with bleeding hemorrhoids and discharged home. She returns with rectal bleeding that has worsened overnight. She has been passing clots and her nurse witnessed the passage of multiple golf ball sized clots in her room. She denies rectal pain. She denies lightheadedness, chest pain, shortness of breath, nausea or vomiting. She does have some back pain that has been present for about a week but is not severe.  HPI  Past Medical History:  Diagnosis Date  . Acid reflux   . Glaucoma   . Hypertension     Patient Active Problem List   Diagnosis Date Noted  . HIP REPLACEMENT, RIGHT, HX OF 05/27/2008  . VITAMIN D DEFICIENCY 05/13/2008  . GERD 05/13/2008  . OSTEOARTHRITIS, HIPS, BILATERAL 05/13/2008    Past Surgical History:  Procedure Laterality Date  . ABDOMINAL HYSTERECTOMY    . JOINT REPLACEMENT      OB History    No data available       Home Medications    Prior to Admission medications   Medication Sig Start Date End Date Taking? Authorizing Provider  acetaminophen (TYLENOL) 500 MG tablet Take 500 mg by mouth every 6 (six) hours as needed for mild pain.   Yes Historical Provider, MD  amLODipine-olmesartan (AZOR) 10-40 MG per tablet Take 1 tablet by mouth daily.   Yes Historical Provider, MD  aspirin EC 81 MG tablet Take 81 mg by mouth daily.   Yes Historical Provider, MD  bimatoprost (LUMIGAN) 0.03 % ophthalmic solution Place 1 drop into both eyes at bedtime.   Yes Historical Provider, MD  calcium-vitamin D (OSCAL WITH D) 500-200 MG-UNIT per tablet Take 1 tablet by mouth daily.   Yes Historical Provider, MD    Cholecalciferol (VITAMIN D3 PO) Take 1 tablet by mouth daily.   Yes Historical Provider, MD  omeprazole (PRILOSEC) 20 MG capsule Take 20 mg by mouth daily.   Yes Historical Provider, MD    Family History No family history on file.  Social History Social History  Substance Use Topics  . Smoking status: Never Smoker  . Smokeless tobacco: Never Used  . Alcohol use No     Allergies   Advil [ibuprofen]; Ciprofloxacin; and Zolpidem tartrate   Review of Systems Review of Systems  All other systems reviewed and are negative.   Physical Exam Updated Vital Signs BP 140/87 (BP Location: Left Arm)   Pulse 78   Temp 98.1 F (36.7 C) (Oral)   Resp 17   SpO2 98%   Physical Exam General: Well-developed, well-nourished female in no acute distress; appearance consistent with age of record HENT: normocephalic; atraumatic Eyes: pupils equal, round and reactive to light; extraocular muscles intact; arcus senilis bilaterally; cataracts bilaterally Neck: supple Heart: regular rate and rhythm Lungs: clear to auscultation bilaterally Abdomen: soft; nondistended; nontender; no masses or hepatosplenomegaly; bowel sounds present Rectal: External hemorrhoids without thrombosis or bleeding; normal sphincter tone; no stool in rectal vault, streaks of blood noted on tip of examining glove Extremities: No deformity; full range of motion; pulses normal Neurologic: Awake, alert and oriented; motor function intact in all  extremities and symmetric; no facial droop Skin: Warm and dry Psychiatric: Normal mood and affect    ED Treatments / Results  Labs from yesterday:  Results for orders placed or performed during the hospital encounter of 09/25/15 (from the past 24 hour(s))  Comprehensive metabolic panel     Status: Abnormal   Collection Time: 09/25/15  6:20 PM  Result Value Ref Range   Sodium 142 135 - 145 mmol/L   Potassium 3.4 (L) 3.5 - 5.1 mmol/L   Chloride 108 101 - 111 mmol/L   CO2 27 22 -  32 mmol/L   Glucose, Bld 114 (H) 65 - 99 mg/dL   BUN 16 6 - 20 mg/dL   Creatinine, Ser 1.61 0.44 - 1.00 mg/dL   Calcium 9.5 8.9 - 09.6 mg/dL   Total Protein 7.1 6.5 - 8.1 g/dL   Albumin 4.0 3.5 - 5.0 g/dL   AST 20 15 - 41 U/L   ALT 14 14 - 54 U/L   Alkaline Phosphatase 73 38 - 126 U/L   Total Bilirubin 0.3 0.3 - 1.2 mg/dL   GFR calc non Af Amer >60 >60 mL/min   GFR calc Af Amer >60 >60 mL/min   Anion gap 7 5 - 15  CBC     Status: None   Collection Time: 09/25/15  6:20 PM  Result Value Ref Range   WBC 6.1 4.0 - 10.5 K/uL   RBC 4.57 3.87 - 5.11 MIL/uL   Hemoglobin 12.0 12.0 - 15.0 g/dL   HCT 04.5 40.9 - 81.1 %   MCV 82.9 78.0 - 100.0 fL   MCH 26.3 26.0 - 34.0 pg   MCHC 31.7 30.0 - 36.0 g/dL   RDW 91.4 78.2 - 95.6 %   Platelets 324 150 - 400 K/uL  Type and screen [facility]     Status: None   Collection Time: 09/25/15  6:20 PM  Result Value Ref Range   ABO/RH(D) B POS    Antibody Screen NEG    Sample Expiration 09/28/2015   POC occult blood, ED     Status: Abnormal   Collection Time: 09/25/15  8:22 PM  Result Value Ref Range   Fecal Occult Bld POSITIVE (A) NEGATIVE   Nursing notes and vitals signs, including pulse oximetry, reviewed.  Summary of this visit's results, reviewed by myself:  Labs:  Results for orders placed or performed during the hospital encounter of 09/26/15 (from the past 24 hour(s))  CBC with Differential/Platelet     Status: Abnormal   Collection Time: 09/26/15  4:57 AM  Result Value Ref Range   WBC 6.0 4.0 - 10.5 K/uL   RBC 4.10 3.87 - 5.11 MIL/uL   Hemoglobin 10.9 (L) 12.0 - 15.0 g/dL   HCT 21.3 (L) 08.6 - 57.8 %   MCV 80.2 78.0 - 100.0 fL   MCH 26.6 26.0 - 34.0 pg   MCHC 33.1 30.0 - 36.0 g/dL   RDW 46.9 62.9 - 52.8 %   Platelets 285 150 - 400 K/uL   Neutrophils Relative % 73 %   Neutro Abs 4.4 1.7 - 7.7 K/uL   Lymphocytes Relative 16 %   Lymphs Abs 1.0 0.7 - 4.0 K/uL   Monocytes Relative 9 %   Monocytes Absolute 0.6  0.1 - 1.0 K/uL   Eosinophils Relative 2 %   Eosinophils Absolute 0.1 0.0 - 0.7 K/uL   Basophils Relative 0 %   Basophils Absolute 0.0 0.0 - 0.1 K/uL  Basic  metabolic panel     Status: Abnormal   Collection Time: 09/26/15  4:57 AM  Result Value Ref Range   Sodium 142 135 - 145 mmol/L   Potassium 3.2 (L) 3.5 - 5.1 mmol/L   Chloride 110 101 - 111 mmol/L   CO2 26 22 - 32 mmol/L   Glucose, Bld 110 (H) 65 - 99 mg/dL   BUN 14 6 - 20 mg/dL   Creatinine, Ser 2.13 0.44 - 1.00 mg/dL   Calcium 9.2 8.9 - 08.6 mg/dL   GFR calc non Af Amer >60 >60 mL/min   GFR calc Af Amer >60 >60 mL/min   Anion gap 6 5 - 15  Type and screen     Status: None   Collection Time: 09/26/15  4:57 AM  Result Value Ref Range   ABO/RH(D) B POS    Antibody Screen NEG    Sample Expiration 09/29/2015     Procedures (including critical care time)  Final Clinical Impressions(s) / ED Diagnoses   Final diagnoses:  Lower GI bleed      Paula Libra, MD 09/26/15 5784    Paula Libra, MD 09/26/15 6962    Paula Libra, MD 09/26/15 9528

## 2015-09-26 NOTE — ED Notes (Signed)
Upon entering room, patient walking around c/o blood running down her legs. Multiple golf sized blood clots on ground. Patient states history of hemorrhoids but never this bad.  [place] notified.

## 2015-09-26 NOTE — Care Management Obs Status (Signed)
MEDICARE OBSERVATION STATUS NOTIFICATION   Patient Details  Name: Tammy [name] MRN: [id number] Date of Birth: 03/26/1933   Medicare Observation Status Notification Given:  Yes    Bartholome BillCLEMENTS, Tyisha Cressy H, RN 09/26/2015, 3:21 PM

## 2015-09-26 NOTE — H&P (Signed)
History and Physical  Patient Name: Tammy [name]     ZOX:[id number]    DOB: Jul 16, 1933    DOA: 09/26/2015 PCP: Gwynneth Aliment, MD   Patient coming from: Home  Chief Complaint: Rectal bleeding again  HPI: Tammy [name] is a 82 y.o. female with a past medical history significant for HTN who presents with rectal bleeding today.  The patient was in her usual state of health (lives alone independently, drives, active) until this morning she woke with the urge to urinate, went to the bathroom and had a bloody bowel movement instead, red blood filling the toilet bowel.  She came to the ER then, was evaluated, had tachycardia but normal hemoglobin, appeared to have "scant bleeding dating from a hemorrhoid" and was discharged home.  She got home, felt the urge to go to the bathroom again, and had more bright red blood per rectum, so called 9-1-1.  ED course: -Afebrile, heart rate 100s, respirations, blood pressure, pulse oximetry normal -Na 142, K 3.2, Cr 0.7, WBC 6 K, Hgb 10.9 from 12 g/dL previously in a day -She called the nurse to her room on arriving to the ER, and was witnessed to have blood dripping down her legs, and clots dropped on the floor -On rectal exam, she had red blood at the tip of the glove -She was given gentle fluids and TRH were asked to evaluate  She has no previous history of GI bleeding. She had a colonoscopy last year by Dr. Loreta Ave, does not recognize the term diverticulosis, denies polyps. She uses no blood thinners. She takes a baby aspirin daily. She has never had a stomach ulcer.    ROS: Review of Systems  Gastrointestinal: Positive for blood in stool. Negative for abdominal pain and melena.  All other systems reviewed and are negative.         Past Medical History:  Diagnosis Date  . Acid reflux   . Glaucoma   . Hypertension     Past Surgical History:  Procedure Laterality Date  . ABDOMINAL HYSTERECTOMY    . JOINT REPLACEMENT      Social History:  Patient lives alone, independently.  The patient walks without assistance. She is from [place] originally. 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 She is independent with all ADLs and IADLs, and still drives.  Does not smoke or drink.  Allergies  Allergen Reactions  . Advil [Ibuprofen] Itching  . Ciprofloxacin     REACTION: hives  . Zolpidem Tartrate     REACTION: trembling    Family history: family history includes Heart disease in her brother; Hypertension in her brother, mother, and sister.  Prior to Admission medications   Medication Sig Start Date End Date Taking? Authorizing Provider  acetaminophen (TYLENOL) 500 MG tablet Take 500 mg by mouth every 6 (six) hours as needed for mild pain.   Yes Historical Provider, MD  amLODipine-olmesartan (AZOR) 10-40 MG per tablet Take 1 tablet by mouth daily.   Yes Historical Provider, MD  aspirin EC 81 MG tablet Take 81 mg by mouth daily.   Yes Historical Provider, MD  bimatoprost (LUMIGAN) 0.03 % ophthalmic solution Place 1 drop into both eyes at bedtime.   Yes Historical Provider, MD  calcium-vitamin D (OSCAL WITH D) 500-200 MG-UNIT per tablet Take 1 tablet by mouth daily.   Yes Historical Provider, MD  Cholecalciferol (VITAMIN D3 PO) Take 1 tablet by mouth daily.   Yes Historical Provider, MD  omeprazole (PRILOSEC) 20  MG capsule Take 20 mg by mouth daily.   Yes Historical Provider, MD       Physical Exam: BP 139/82   Pulse 74   Temp 98.1 F (36.7 C) (Oral)   Resp 22   SpO2 100%  General appearance: Well-developed, elderly adult female, alert and in no acute distress.  Appears about half her stated age.  Comfortable.   Eyes: Anicteric, conjunctiva pink, lids and lashes normal.     ENT: No nasal deformity, discharge, or epistaxis.  OP moist without lesions.  Excellent dentition. Lymph: No cervical or supraclavicular lymphadenopathy. Skin: Warm and dry.  No jaundice.  No suspicious rashes or lesions. Cardiac: RRR, nl  S1-S2, no murmurs appreciated.  Capillary refill is brisk.  JVP normal.  No LE edema.  Radial and DP pulses 2+ and symmetric. Respiratory: Normal respiratory rate and rhythm.  CTAB without rales or wheezes. GI: Abdomen soft without rigidity.  No TTP. No ascites, distension, hepatosplenomegaly.   MSK: No deformities or effusions.  No clubbing/cyanosis. Neuro: Cranial nerves normal. Sensorium intact and responding to questions, attention normal.  Speech is fluent.  Moves all extremities equally and with normal coordination.    Psych: Affect pleasant.  Judgment and insight appear normal.       Labs on Admission:  I have personally reviewed following labs and imaging studies: CBC:  Recent Labs Lab 09/25/15 1820 09/26/15 0457  WBC 6.1 6.0  NEUTROABS  --  4.4  HGB 12.0 10.9*  HCT 37.9 32.9*  MCV 82.9 80.2  PLT 324 285   Basic Metabolic Panel:  Recent Labs Lab 09/25/15 1820 09/26/15 0457  NA 142 142  K 3.4* 3.2*  CL 108 110  CO2 27 26  GLUCOSE 114* 110*  BUN 16 14  CREATININE 0.86 0.75  CALCIUM 9.5 9.2   GFR: Estimated Creatinine Clearance: 52.5 mL/min (by C-G formula based on SCr of 0.8 mg/dL).  Liver Function Tests:  Recent Labs Lab 09/25/15 1820  AST 20  ALT 14  ALKPHOS 73  BILITOT 0.3  PROT 7.1  ALBUMIN 4.0     Assessment/Plan 1. Rectal bleeding and acute blood loss anemia:  Painless rectal bleeding consistent with diverticular bleed.  Other diagnostic considerations include internal hemorrhoid, ischemic colitis.  Doubt infectious colitis.  Given age and 1 g/dL drop in hemoglobin over 6 hours, feel that in hospital observation for 24 hours is prudent. -Type and screen completed -Repeat CBC at 2 PM and tomorrow morning -Clears -Gentle IVF -Monitor for ongoing bleeding, resolution   2. Hypokalemia:  Mild. -IVF with K  3. HTN:  -Continue amlodipine-olmesartan  4. GERD:  -Continue PPI     DVT prophylaxis: SCDs  Code Status: Full  Family  Communication: None present  Disposition Plan: Anticipate monitoring for ongoing bleeding, trend hemoglobin. If bleeding resolves and hemoglobin stable, expect will be medically stable for discharge from the hospital within 24-48 hours. Consults called: None Admission status: OBS, MedSurg      Medical decision making: Patient seen at 5:50 AM on 09/26/2015.  The patient was discussed with Dr. Read DriversMolpus. What exists of the patient's chart was reviewed in depth.  Clinical condition: stable.        Alberteen SamChristopher P Malakie Balis [facility] Pager [phone]

## 2015-09-26 NOTE — ED Triage Notes (Signed)
Per EMS pt from pt went to bathroom noticed some bright red blood in toilet. States from her hemmroids, was seen yesterday. Was not given any medication just sent to her GI.

## 2015-09-26 NOTE — Progress Notes (Signed)
[facility] PLAN OF CARE NOTE Patient: Tammy HillockLillie M [name] RUE:[id number]:[id number]   PCP: Gwynneth AlimentSANDERS,[name], MD DOB: 09/27/1933   DOA: 09/26/2015   DOS: 09/26/2015    Patient was admitted by my colleague Dr.danford  earlier on 09/26/2015. I have reviewed the H&P as well as assessment and plan and agree with the same. Important changes in the plan are listed below.  Plan of care: Principal Problem:   Rectal bleeding   Acute blood loss anemia Continue to monitor H&H, GI consulted for follow-up since the patient is also mentioning about having clots.    Hypokalemia Replacing  Author: Lynden OxfordPranav Robert Sunga, MD [facility] Pager: [phone] 09/26/2015 1:07 PM   If 7PM-7AM, please contact night-coverage at [email or web address], password [email or web address]

## 2015-09-27 DIAGNOSIS — K625 Hemorrhage of anus and rectum: Secondary | ICD-10-CM | POA: Diagnosis not present

## 2015-09-27 DIAGNOSIS — K5730 Diverticulosis of large intestine without perforation or abscess without bleeding: Secondary | ICD-10-CM | POA: Diagnosis not present

## 2015-09-27 DIAGNOSIS — K921 Melena: Secondary | ICD-10-CM | POA: Diagnosis not present

## 2015-09-27 DIAGNOSIS — D500 Iron deficiency anemia secondary to blood loss (chronic): Secondary | ICD-10-CM | POA: Diagnosis not present

## 2015-09-27 DIAGNOSIS — Z79899 Other long term (current) drug therapy: Secondary | ICD-10-CM | POA: Diagnosis not present

## 2015-09-27 DIAGNOSIS — I10 Essential (primary) hypertension: Secondary | ICD-10-CM | POA: Diagnosis not present

## 2015-09-27 DIAGNOSIS — Z8249 Family history of ischemic heart disease and other diseases of the circulatory system: Secondary | ICD-10-CM | POA: Diagnosis not present

## 2015-09-27 DIAGNOSIS — E876 Hypokalemia: Secondary | ICD-10-CM | POA: Diagnosis not present

## 2015-09-27 DIAGNOSIS — K5790 Diverticulosis of intestine, part unspecified, without perforation or abscess without bleeding: Secondary | ICD-10-CM | POA: Diagnosis not present

## 2015-09-27 DIAGNOSIS — K922 Gastrointestinal hemorrhage, unspecified: Secondary | ICD-10-CM | POA: Diagnosis not present

## 2015-09-27 DIAGNOSIS — K5900 Constipation, unspecified: Secondary | ICD-10-CM | POA: Diagnosis not present

## 2015-09-27 DIAGNOSIS — D62 Acute posthemorrhagic anemia: Secondary | ICD-10-CM | POA: Diagnosis not present

## 2015-09-27 DIAGNOSIS — Z7982 Long term (current) use of aspirin: Secondary | ICD-10-CM | POA: Diagnosis not present

## 2015-09-27 DIAGNOSIS — E46 Unspecified protein-calorie malnutrition: Secondary | ICD-10-CM | POA: Diagnosis not present

## 2015-09-27 DIAGNOSIS — K219 Gastro-esophageal reflux disease without esophagitis: Secondary | ICD-10-CM | POA: Diagnosis not present

## 2015-09-27 DIAGNOSIS — Z6822 Body mass index (BMI) 22.0-22.9, adult: Secondary | ICD-10-CM | POA: Diagnosis not present

## 2015-09-27 DIAGNOSIS — H409 Unspecified glaucoma: Secondary | ICD-10-CM | POA: Diagnosis not present

## 2015-09-27 DIAGNOSIS — K5731 Diverticulosis of large intestine without perforation or abscess with bleeding: Secondary | ICD-10-CM | POA: Diagnosis not present

## 2015-09-27 LAB — CBC
HEMATOCRIT: 23.8 % — AB (ref 36.0–46.0)
HEMATOCRIT: 21.9 % — AB (ref 36.0–46.0)
HEMATOCRIT: 26.2 % — AB (ref 36.0–46.0)
HEMOGLOBIN: 7.6 g/dL — AB (ref 12.0–15.0)
HEMOGLOBIN: 8.8 g/dL — AB (ref 12.0–15.0)
Hemoglobin: 7 g/dL — ABNORMAL LOW (ref 12.0–15.0)
MCH: 26.4 pg (ref 26.0–34.0)
MCH: 26.6 pg (ref 26.0–34.0)
MCH: 27.9 pg (ref 26.0–34.0)
MCHC: 31.9 g/dL (ref 30.0–36.0)
MCHC: 32 g/dL (ref 30.0–36.0)
MCHC: 33.6 g/dL (ref 30.0–36.0)
MCV: 82.6 fL (ref 78.0–100.0)
MCV: 83.3 fL (ref 78.0–100.0)
MCV: 83.2 fL (ref 78.0–100.0)
PLATELETS: 212 10*3/uL (ref 150–400)
Platelets: 224 10*3/uL (ref 150–400)
Platelets: 195 10*3/uL (ref 150–400)
RBC: 2.88 MIL/uL — AB (ref 3.87–5.11)
RBC: 2.63 MIL/uL — AB (ref 3.87–5.11)
RBC: 3.15 MIL/uL — ABNORMAL LOW (ref 3.87–5.11)
RDW: 14.5 % (ref 11.5–15.5)
RDW: 14.8 % (ref 11.5–15.5)
RDW: 14.2 % (ref 11.5–15.5)
WBC: 5.7 10*3/uL (ref 4.0–10.5)
WBC: 5.5 10*3/uL (ref 4.0–10.5)
WBC: 6.8 10*3/uL (ref 4.0–10.5)

## 2015-09-27 LAB — BASIC METABOLIC PANEL
ANION GAP: 2 — AB (ref 5–15)
BUN: 9 mg/dL (ref 6–20)
CALCIUM: 8.5 mg/dL — AB (ref 8.9–10.3)
CO2: 25 mmol/L (ref 22–32)
Chloride: 114 mmol/L — ABNORMAL HIGH (ref 101–111)
Creatinine, Ser: 0.8 mg/dL (ref 0.44–1.00)
GFR calc non Af Amer: >60 mL/min (ref >60)
GLUCOSE: 102 mg/dL — AB (ref 65–99)
POTASSIUM: 4 mmol/L (ref 3.5–5.1)
Sodium: 141 mmol/L (ref 135–145)

## 2015-09-27 LAB — PREPARE RBC (CROSSMATCH)

## 2015-09-27 MED ORDER — SODIUM CHLORIDE 0.9 % IV SOLN
Freq: Once | INTRAVENOUS | Status: AC
  Administered 2015-09-27: 13:00:00 via INTRAVENOUS

## 2015-09-27 NOTE — Progress Notes (Signed)
Patient had another episode of bloody stool.Unable to measure because of patient using the toilet. Will continue to monitor.

## 2015-09-27 NOTE — Progress Notes (Signed)
[facility] Progress Note  Patient: Tammy [name]:[id number]   PCP: Gwynneth Aliment, MD DOB: 06-06-1933   DOA: 09/26/2015   DOS: 09/27/2015   Date of Service: the patient was seen and examined on 09/27/2015  Subjective: Patient mentions that she had recurrence of bleeding earlier in the morning. Denies any acute complaint of abdominal pain. Nutrition: Tolerating clear liquids  Brief hospital course: Pt. with PMH of hypertension, rectal bleeding; admitted on 09/26/2015, with complaint of rectal bleed, was found to have probable diverticular bleed with symptomatic anemia. Currently further plan is continue monitoring.  Assessment and Plan: 1. Rectal bleeding H&H dropped significantly to 7 from 12. We will transfuse patient 2 units of PRBC. Gastroenterology continues to follow. May require nuclear scan if require further transfusion. Continue twice a day PPI.  2. Essential hypertension. Blood pressure relatively stable and therefore antihypertensive medications are on hold. We'll continue to monitor.   Pain management: When necessary Tylenol Activity: No indication for physical therapy Bowel regimen: last BM 09/27/2015 Diet: Clear liquid diet DVT Prophylaxis: mechanical compression device  Advance goals of care discussion: Full code  Family Communication: family was present at bedside, at the time of interview. The pt provided permission to discuss medical plan with the family. Opportunity was given to ask question and all questions were answered satisfactorily.   Disposition:  Discharge to home. Expected discharge date: 09/29/2015, stabilization of hemoglobin  Consultants: Gastroenterology Procedures: Blood transfusion  Antibiotics: Anti-infectives    None        Intake/Output Summary (Last 24 hours) at 09/27/15 1532 Last data filed at 09/27/15 1433  Gross per 24 hour  Intake             1485 ml  Output              700 ml  Net              785 ml   Filed  Weights   09/26/15 0804  Weight: 66 kg (145 lb 8.1 oz)    Objective: Physical Exam: Vitals:   09/26/15 2035 09/27/15 0502 09/27/15 1224 09/27/15 1255  BP: 119/60 121/77 130/69 123/74  Pulse: 67 80 77 67  Resp: 16 16 20 20   Temp: 98.2 F (36.8 C) 98.3 F (36.8 C) 98.9 F (37.2 C) 99 F (37.2 C)  TempSrc: Oral Oral Oral Oral  SpO2: 100% 100% 100%   Weight:      Height:        General: Alert, Awake and Oriented to Time, Place and Person. Appear in mild distress Eyes: PERRL, Conjunctiva normal ENT: Oral Mucosa clear moist. Neck: no JVD, no Abnormal Mass Or lumps Cardiovascular: S1 and S2 Present, no Murmur, Respiratory: Bilateral Air entry equal and Decreased, Clear to Auscultation, o Crackles, no wheezes Abdomen: Bowel Sound present, Soft and no tenderness Skin: no redness, no Rash  Extremities: no Pedal edema, no calf tenderness Neurologic: Grossly no focal neuro deficit. Bilaterally Equal motor strength  Data Reviewed: CBC:  Recent Labs Lab 09/25/15 1820 09/26/15 0457 09/26/15 1333 09/27/15 0422 09/27/15 0945  WBC 6.1 6.0 6.8 5.7 5.5  NEUTROABS  --  4.4  --   --   --   HGB 12.0 10.9* 8.2* 7.6* 7.0*  HCT 37.9 32.9* 25.8* 23.8* 21.9*  MCV 82.9 80.2 82.7 82.6 83.3  PLT 324 285 260 224 212   Basic Metabolic Panel:  Recent Labs Lab 09/25/15 1820 09/26/15 0457 09/27/15 0422  NA 142 142 141  K 3.4* 3.2* 4.0  CL 108 110 114*  CO2 27 26 25   GLUCOSE 114* 110* 102*  BUN 16 14 9   CREATININE 0.86 0.75 0.80  CALCIUM 9.5 9.2 8.5*    Liver Function Tests:  Recent Labs Lab 09/25/15 1820  AST 20  ALT 14  ALKPHOS 73  BILITOT 0.3  PROT 7.1  ALBUMIN 4.0   No results for input(s): LIPASE, AMYLASE in the last 168 hours. No results for input(s): AMMONIA in the last 168 hours. Coagulation Profile: No results for input(s): INR, PROTIME in the last 168 hours. Cardiac Enzymes: No results for input(s): CKTOTAL, CKMB, CKMBINDEX, TROPONINI in the last 168  hours. BNP (last 3 results) No results for input(s): PROBNP in the last 8760 hours.  CBG: No results for input(s): GLUCAP in the last 168 hours.  Studies: No results found.   Scheduled Meds: . latanoprost  1 drop Both Eyes QHS  . pantoprazole  40 mg Oral BID   Continuous Infusions: . 0.9 % NaCl with KCl 20 mEq / L 75 mL/hr at 09/27/15 0759   PRN Meds: acetaminophen **OR** acetaminophen, ondansetron **OR** ondansetron (ZOFRAN) IV  Time spent: 30 minutes  Author: Lynden OxfordPranav Khamiyah Grefe, MD [facility] Pager: [phone] 09/27/2015 3:32 PM  If 7PM-7AM, please contact night-coverage at [email or web address], password [email or web address]

## 2015-09-27 NOTE — Progress Notes (Signed)
Patient had another episode of bloody stool, bright red blood with tiny clots at this time. Measured 200 ccc in the collection container. Will continue to monitor.

## 2015-09-27 NOTE — Progress Notes (Signed)
    Progress Note Covering for Dr. Elnoria HowardHung Pt  Subjective  Chief Complaint: Hematochezia  Pt reports that bleeding has slowed some over the past 24 hours, did have episode of "quite a lot of blood" around 1:30 this morning and again around 8:30. Pt reports a lot of clots. No abdominal pain. No new complaints.   Objective   Vital signs in last 24 hours: Temp:  [98.2 F (36.8 C)-98.3 F (36.8 C)] 98.3 F (36.8 C) (09/02 0502) Pulse Rate:  [67-80] 80 (09/02 0502) Resp:  [16] 16 (09/02 0502) BP: (116-121)/(60-77) 121/77 (09/02 0502) SpO2:  [100 %] 100 % (09/02 0502) Last BM Date: 09/26/15 General: African American female in NAD Heart:  Regular rate and rhythm; no murmurs Lungs: Respirations even and unlabored, lungs CTA bilaterally Abdomen:  Soft, nontender and nondistended. Normal bowel sounds. Extremities:  Without edema. Neurologic:  Alert and oriented,  grossly normal neurologically. Psych:  Cooperative. Normal mood and affect.  Intake/Output from previous day: 09/01 0701 - 09/02 0700 In: 795 [P.O.:240; I.V.:555] Out: -  Intake/Output this shift: Total I/O In: 480 [P.O.:480] Out: -   Lab Results:  Recent Labs  09/26/15 0457 09/26/15 1333 09/27/15 0422  WBC 6.0 6.8 5.7  HGB 10.9* 8.2* 7.6*  HCT 32.9* 25.8* 23.8*  PLT 285 260 224   BMET  Recent Labs  09/25/15 1820 09/26/15 0457 09/27/15 0422  NA 142 142 141  K 3.4* 3.2* 4.0  CL 108 110 114*  CO2 27 26 25   GLUCOSE 114* 110* 102*  BUN 16 14 9   CREATININE 0.86 0.75 0.80  CALCIUM 9.5 9.2 8.5*   LFT  Recent Labs  09/25/15 1820  PROT 7.1  ALBUMIN 4.0  AST 20  ALT 14  ALKPHOS 73  BILITOT 0.3   PT/INR No results for input(s): LABPROT, INR in the last 72 hours.  Studies/Results: No results found.     Assessment / Plan:   Assessment: 1. Diverticular Bleed: two episdodes of bleeding so far today, 1:30 am and 8:30-hgb remaining >7 at this time 2. Anemia: hgb did fall some overnight, 8.2 to  7.6 3. Hematochezia: as above  Plan: 1.  Continue to monitor hgb with transfusion as necessary 2. Continue supportive measures 3. Continue clear liquid diet 4. Again if large transfusion requirement over short period of time, would benefit from bleeding scan and possible IR/Surgical intervention 5. Please await any further recs from Dr. Lavon PaganiniNandigam  We will continue to follow along.  Violet BaldyJennifer Lynne Sunshine Mackowski  09/27/2015, 9:09 AM  Pager # [phone]   Addendum: Pt hgb has continued to drop this morning, recommend 1 uprbcs. Discussed with hospitalist.

## 2015-09-27 NOTE — Progress Notes (Signed)
Pt had stated earlier in the shift that the rectal bleeding had stopped. However, around 0130 she woke up and went to the bathroom and had blood again. She flushed it so I didn't get to observe it, but she said it was quite a bit. She will let me know if it happens again so I can assess it. Mick SellShannon Travion Ke RN

## 2015-09-27 NOTE — Progress Notes (Signed)
Pt passed about 50cc of blood into the hat in the toilet. Mick SellShannon Bernadette Armijo RN

## 2015-09-27 NOTE — Progress Notes (Signed)
Patient with Hbg of 7.6.  Also, patient had another episode of bloody stool (bright red blood with some clots) around 0815.  Unable to measure due to patient using toilet.  Appears to be around 50-100 ccs.  Dr. Allena KatzPatel notified of above information.  Collection container placed in toilet to measure any further occurrences.   Allayne ButcherMiller, Marly Schuld [facility]  09/27/2015  11:08 AM

## 2015-09-28 LAB — CBC
HCT: 25.9 % — ABNORMAL LOW (ref 36.0–46.0)
HCT: 24.8 % — ABNORMAL LOW (ref 36.0–46.0)
HCT: 20.3 % — ABNORMAL LOW (ref 36.0–46.0)
HEMATOCRIT: 25.4 % — AB (ref 36.0–46.0)
HEMOGLOBIN: 6.9 g/dL — AB (ref 12.0–15.0)
Hemoglobin: 8.4 g/dL — ABNORMAL LOW (ref 12.0–15.0)
Hemoglobin: 8.7 g/dL — ABNORMAL LOW (ref 12.0–15.0)
Hemoglobin: 8.1 g/dL — ABNORMAL LOW (ref 12.0–15.0)
MCH: 27 pg (ref 26.0–34.0)
MCH: 27.7 pg (ref 26.0–34.0)
MCH: 26.7 pg (ref 26.0–34.0)
MCH: 28.2 pg (ref 26.0–34.0)
MCHC: 33.1 g/dL (ref 30.0–36.0)
MCHC: 33.6 g/dL (ref 30.0–36.0)
MCHC: 32.7 g/dL (ref 30.0–36.0)
MCHC: 34 g/dL (ref 30.0–36.0)
MCV: 81.7 fL (ref 78.0–100.0)
MCV: 82.5 fL (ref 78.0–100.0)
MCV: 81.8 fL (ref 78.0–100.0)
MCV: 82.9 fL (ref 78.0–100.0)
PLATELETS: 194 10*3/uL (ref 150–400)
PLATELETS: 195 10*3/uL (ref 150–400)
PLATELETS: 238 10*3/uL (ref 150–400)
Platelets: 174 10*3/uL (ref 150–400)
RBC: 3.11 MIL/uL — ABNORMAL LOW (ref 3.87–5.11)
RBC: 3.14 MIL/uL — ABNORMAL LOW (ref 3.87–5.11)
RBC: 3.03 MIL/uL — AB (ref 3.87–5.11)
RBC: 2.45 MIL/uL — AB (ref 3.87–5.11)
RDW: 14.3 % (ref 11.5–15.5)
RDW: 14.5 % (ref 11.5–15.5)
RDW: 14.5 % (ref 11.5–15.5)
RDW: 14.5 % (ref 11.5–15.5)
WBC: 6.3 10*3/uL (ref 4.0–10.5)
WBC: 6.9 10*3/uL (ref 4.0–10.5)
WBC: 9.5 10*3/uL (ref 4.0–10.5)
WBC: 9.9 10*3/uL (ref 4.0–10.5)

## 2015-09-28 LAB — PREPARE RBC (CROSSMATCH)

## 2015-09-28 MED ORDER — SODIUM CHLORIDE 0.9 % IV SOLN
Freq: Once | INTRAVENOUS | Status: AC
  Administered 2015-09-28: via INTRAVENOUS

## 2015-09-28 NOTE — Progress Notes (Signed)
[facility] Progress Note  Patient: Tammy [name] HDQ:[id number]   PCP: Gwynneth Aliment, MD DOB: August 26, 1933   DOA: 09/26/2015   DOS: 09/28/2015   Date of Service: the patient was seen and examined on 09/28/2015  Subjective: Patient did have a small amount of BRBPR earlier in the morning. Denies any abdominal pain. Denies any dizziness or shortness of breath while ambulating. Nutrition: Tolerating clear liquids  Brief hospital course: Pt. with PMH of hypertension, rectal bleeding; admitted on 09/26/2015, with complaint of rectal bleed, was found to have probable diverticular bleed with symptomatic anemia. S/P 2 PRBC on 09/27/2015. Currently further plan is continue monitoring.  Assessment and Plan: 1. Rectal bleeding H&H dropped significantly to 7 from 12. Required transfusion of 2 units of PRBC on 09/27/2015. Gastroenterology continues to follow. May require nuclear scan if require further transfusion. Continue twice a day PPI. Recheck CBC at 6 PM, transfuse as necessary for hemoglobin less than 7.  2. Essential hypertension. Blood pressure relatively stable and therefore antihypertensive medications are on hold. We'll continue to monitor.   Pain management: When necessary Tylenol Activity: No indication for physical therapy Bowel regimen: last BM 09/27/2015 Diet: Clear liquid diet DVT Prophylaxis: mechanical compression device  Advance goals of care discussion: Full code  Family Communication: family was present at bedside, at the time of interview. The pt provided permission to discuss medical plan with the family. Opportunity was given to ask question and all questions were answered satisfactorily.   Disposition:  Discharge to home. Expected discharge date: 09/29/2015, stabilization of hemoglobin  Consultants: Gastroenterology Procedures: Blood transfusion  Antibiotics: Anti-infectives    None        Intake/Output Summary (Last 24 hours) at 09/28/15 0956 Last  data filed at 09/28/15 0932  Gross per 24 hour  Intake             2575 ml  Output             1140 ml  Net             1435 ml   Filed Weights   09/26/15 0804  Weight: 66 kg (145 lb 8.1 oz)    Objective: Physical Exam: Vitals:   09/27/15 1652 09/27/15 1927 09/27/15 2047 09/28/15 0406  BP: 135/72 123/73 (!) 125/107 125/67  Pulse: 80 63 60 65  Resp: 18 16 17 16   Temp: 98.3 F (36.8 C) 98.4 F (36.9 C) 99 F (37.2 C) 98 F (36.7 C)  TempSrc: Oral Oral Oral Oral  SpO2: 100% 100% 99% 100%  Weight:      Height:        General: Alert, Awake and Oriented to Time, Place and Person. Appear in mild distress Eyes: PERRL, Conjunctiva normal ENT: Oral Mucosa clear moist. Neck: no JVD, no Abnormal Mass Or lumps Cardiovascular: S1 and S2 Present, no Murmur, Respiratory: Bilateral Air entry equal and Decreased, Clear to Auscultation, o Crackles, no wheezes Abdomen: Bowel Sound present, Soft and no tenderness Skin: no redness, no Rash  Extremities: no Pedal edema, no calf tenderness  Data Reviewed: CBC:  Recent Labs Lab 09/26/15 0457  09/27/15 0422 09/27/15 0945 09/27/15 2051 09/28/15 0434 09/28/15 0900  WBC 6.0  < > 5.7 5.5 6.8 6.3 6.9  NEUTROABS 4.4  --   --   --   --   --   --   HGB 10.9*  < > 7.6* 7.0* 8.8* 8.4* 8.7*  HCT 32.9*  < > 23.8* 21.9* 26.2* 25.4* 25.9*  MCV 80.2  < > 82.6 83.3 83.2 81.7 82.5  PLT 285  < > 224 212 195 194 195  < > = values in this interval not displayed. Basic Metabolic Panel:  Recent Labs Lab 09/25/15 1820 09/26/15 0457 09/27/15 0422  NA 142 142 141  K 3.4* 3.2* 4.0  CL 108 110 114*  CO2 27 26 25   GLUCOSE 114* 110* 102*  BUN 16 14 9   CREATININE 0.86 0.75 0.80  CALCIUM 9.5 9.2 8.5*    Liver Function Tests:  Recent Labs Lab 09/25/15 1820  AST 20  ALT 14  ALKPHOS 73  BILITOT 0.3  PROT 7.1  ALBUMIN 4.0   No results for input(s): LIPASE, AMYLASE in the last 168 hours. No results for input(s): AMMONIA in the last 168  hours. Coagulation Profile: No results for input(s): INR, PROTIME in the last 168 hours. Cardiac Enzymes: No results for input(s): CKTOTAL, CKMB, CKMBINDEX, TROPONINI in the last 168 hours. BNP (last 3 results) No results for input(s): PROBNP in the last 8760 hours.  CBG: No results for input(s): GLUCAP in the last 168 hours.  Studies: No results found.   Scheduled Meds: . latanoprost  1 drop Both Eyes QHS  . pantoprazole  40 mg Oral BID   Continuous Infusions: . 0.9 % NaCl with KCl 20 mEq / L 75 mL/hr at 09/27/15 0759   PRN Meds: acetaminophen **OR** acetaminophen, ondansetron **OR** ondansetron (ZOFRAN) IV  Time spent: 30 minutes  Author: Lynden OxfordPranav Shaquoia Miers, MD [facility] Pager: [phone] 09/28/2015 9:56 AM  If 7PM-7AM, please contact night-coverage at [email or web address], password [email or web address]

## 2015-09-28 NOTE — Progress Notes (Signed)
Patient had 3 episodes of bloody stools with clots. Dr. Allena KatzPatel notified, new orders noted. Will continue to monitor.

## 2015-09-28 NOTE — Progress Notes (Signed)
CRITICAL VALUE ALERT  Critical value received:  Hgb 6.9  Date of notification:  09/28/15  Time of notification:  2217  Critical value read back:Yes.    Nurse who received alert:  Mick SellShannon Gabryela Kimbrell RN   MD notified (1st page):  Merdis DelayK. Schorr NP  Time of first page:  2226  MD notified (2nd page):  Time of second page:  Responding MD:  Merdis DelayK. Schorr NP  Time MD responded:

## 2015-09-28 NOTE — Progress Notes (Signed)
    Progress Note   Subjective  Chief Complaint: Hematochezia  Pt reports doing well overnight, did just have a BM with some "clots in it" around 8 am, last around 10:30 last night, does request some "real food", but knows that this must wait until bleeding slows/stops. No new complaints. Continues with no abdominal pain.    Objective   Vital signs in last 24 hours: Temp:  [98 F (36.7 C)-99 F (37.2 C)] 98 F (36.7 C) (09/03 0406) Pulse Rate:  [60-80] 65 (09/03 0406) Resp:  [16-20] 16 (09/03 0406) BP: (123-139)/(67-107) 125/67 (09/03 0406) SpO2:  [99 %-100 %] 100 % (09/03 0406) Last BM Date: 09/27/15 General:  African American female in NAD Heart:  Regular rate and rhythm; no murmurs Lungs: Respirations even and unlabored, lungs CTA bilaterally Abdomen:  Soft, nontender and nondistended. Normal bowel sounds. Extremities:  Without edema. Neurologic:  Alert and oriented,  grossly normal neurologically. Psych:  Cooperative. Normal mood and affect.  Intake/Output from previous day: 09/02 0701 - 09/03 0700 In: 2535 [P.O.:1360; I.V.:450; Blood:725] Out: 1020 [Urine:1020]  Lab Results:  Recent Labs  09/27/15 2051 09/28/15 0434 09/28/15 0900  WBC 6.8 6.3 6.9  HGB 8.8* 8.4* 8.7*  HCT 26.2* 25.4* 25.9*  PLT 195 194 195   BMET  Recent Labs  09/25/15 1820 09/26/15 0457 09/27/15 0422  NA 142 142 141  K 3.4* 3.2* 4.0  CL 108 110 114*  CO2 27 26 25   GLUCOSE 114* 110* 102*  BUN 16 14 9   CREATININE 0.86 0.75 0.80  CALCIUM 9.5 9.2 8.5*   LFT  Recent Labs  09/25/15 1820  PROT 7.1  ALBUMIN 4.0  AST 20  ALT 14  ALKPHOS 73  BILITOT 0.3   PT/INR No results for input(s): LABPROT, INR in the last 72 hours.  Studies/Results: No results found.     Assessment / Plan:   Assessment: 1. Diverticular Bleed: last episode of bleeding this morning around 8:00 am, previous 10:30pm last night, seems to be smaller in amount, hgb remaining >8 after transfusion of 2u  prbc's yesterday 2. Anemia: currently stable at 8.7 3. Hematochezia: as above  Plan: 1.  Continue to monitor hgb with transfusion as necessary 2. Continue supportive measures 3. Continue clear liquid diet 4. Again if large transfusion requirement over short period of time, would benefit from bleeding scan and possible IR/Surgical intervention 5. Please await any further recs from Dr. Lavon PaganiniNandigam  We will continue to follow along.  Violet BaldyJennifer Lynne [facility]  09/28/2015, 9:29 AM  Pager # [phone]

## 2015-09-29 ENCOUNTER — Inpatient Hospital Stay (HOSPITAL_COMMUNITY): Payer: Medicare Other

## 2015-09-29 DIAGNOSIS — K5731 Diverticulosis of large intestine without perforation or abscess with bleeding: Secondary | ICD-10-CM

## 2015-09-29 LAB — CBC
HCT: 23.5 % — ABNORMAL LOW (ref 36.0–46.0)
HEMATOCRIT: 24.8 % — AB (ref 36.0–46.0)
HEMOGLOBIN: 8 g/dL — AB (ref 12.0–15.0)
HEMOGLOBIN: 8.3 g/dL — AB (ref 12.0–15.0)
MCH: 27.2 pg (ref 26.0–34.0)
MCH: 27.4 pg (ref 26.0–34.0)
MCHC: 34 g/dL (ref 30.0–36.0)
MCHC: 33.5 g/dL (ref 30.0–36.0)
MCV: 79.9 fL (ref 78.0–100.0)
MCV: 81.8 fL (ref 78.0–100.0)
PLATELETS: 167 10*3/uL (ref 150–400)
Platelets: 190 10*3/uL (ref 150–400)
RBC: 2.94 MIL/uL — AB (ref 3.87–5.11)
RBC: 3.03 MIL/uL — ABNORMAL LOW (ref 3.87–5.11)
RDW: 15 % (ref 11.5–15.5)
RDW: 15.2 % (ref 11.5–15.5)
WBC: 7.6 10*3/uL (ref 4.0–10.5)
WBC: 8.1 10*3/uL (ref 4.0–10.5)

## 2015-09-29 LAB — TYPE AND SCREEN
ABO/RH(D): B POS
ANTIBODY SCREEN: NEGATIVE
UNIT DIVISION: 0
UNIT DIVISION: 0
Unit division: 0

## 2015-09-29 LAB — BASIC METABOLIC PANEL
ANION GAP: 4 — AB (ref 5–15)
BUN: 7 mg/dL (ref 6–20)
CHLORIDE: 112 mmol/L — AB (ref 101–111)
CO2: 25 mmol/L (ref 22–32)
Calcium: 8.3 mg/dL — ABNORMAL LOW (ref 8.9–10.3)
Creatinine, Ser: 0.8 mg/dL (ref 0.44–1.00)
GFR calc Af Amer: >60 mL/min (ref >60)
GLUCOSE: 102 mg/dL — AB (ref 65–99)
POTASSIUM: 3.7 mmol/L (ref 3.5–5.1)
Sodium: 141 mmol/L (ref 135–145)

## 2015-09-29 MED ORDER — PEG-KCL-NACL-NASULF-NA ASC-C 100 G PO SOLR
0.5000 | Freq: Once | ORAL | Status: AC
  Administered 2015-09-29: 100 g via ORAL
  Filled 2015-09-29: qty 1

## 2015-09-29 MED ORDER — PEG-KCL-NACL-NASULF-NA ASC-C 100 G PO SOLR: 1.0000 | Freq: Once | ORAL | Status: DC

## 2015-09-29 MED ORDER — TECHNETIUM TC 99M-LABELED RED BLOOD CELLS IV KIT
20.8000 | PACK | Freq: Once | INTRAVENOUS | Status: AC | PRN
  Administered 2015-09-29: 20.8 via INTRAVENOUS

## 2015-09-29 NOTE — Progress Notes (Signed)
    Progress Note Covering for Dr. Elnoria HowardHung  Subjective  Chief Complaint: Hematochezia  Pt reports that around 2:30 yesterday she started again with bloody bowel movements every 15 min, this was after initiating a full liquid diet around 1:00. This continued until 6 and patient has not had another BM since then. Niece does ask some questions regarding bleeding scan/other intervention as patient did require another blood transfusion yesterday.    Objective   Vital signs in last 24 hours: Temp:  [98.5 F (36.9 C)-99.2 F (37.3 C)] 98.5 F (36.9 C) (09/04 0440) Pulse Rate:  [55-105] 65 (09/04 0440) Resp:  [14-18] 14 (09/04 0440) BP: (100-151)/(59-99) 121/77 (09/04 0440) SpO2:  [100 %] 100 % (09/04 0440) Last BM Date: 09/28/15 General: African American female in NAD Heart:  Regular rate and rhythm; no murmurs Lungs: Respirations even and unlabored, lungs CTA bilaterally Abdomen:  Soft, nontender and nondistended. Normal bowel sounds. Extremities:  Without edema. Neurologic:  Alert and oriented,  grossly normal neurologically. Psych:  Cooperative. Normal mood and affect.  Intake/Output from previous day: 09/03 0701 - 09/04 0700 In: 2597.5 [P.O.:1160; I.V.:925; Blood:512.5] Out: 1323 [Urine:820; Stool:103]  Lab Results:  Recent Labs  09/28/15 1811 09/28/15 2202 09/29/15 0618  WBC 9.5 9.9 7.6  HGB 8.1* 6.9* 8.0*  HCT 24.8* 20.3* 23.5*  PLT 238 174 167   BMET  Recent Labs  09/27/15 0422 09/29/15 0618  NA 141 141  K 4.0 3.7  CL 114* 112*  CO2 25 25  GLUCOSE 102* 102*  BUN 9 7  CREATININE 0.80 0.80  CALCIUM 8.5* 8.3*   LFT No results for input(s): PROT, ALBUMIN, AST, ALT, ALKPHOS, BILITOT, BILIDIR, IBILI in the last 72 hours. PT/INR No results for input(s): LABPROT, INR in the last 72 hours.  Studies/Results: No results found.     Assessment / Plan:   Assessment: 1. Diverticular Bleed:pt required 2 u prbcs on 09/28/15 and another one last night after multiple  episodes of brbpr 2. Anemia: decrease overnight to 6.9, now at 8.0 after 1 u prbcs yesterday 3. Hematochezia:as above  Plan: 1. Continue to monitor hgb with transfusion as necessary 2. Continue supportive measures 3. Pt currently NPO, can have clear liquids 4. Again if large transfusion requirement over short period of time, would benefit from bleeding scan and possible IR/Surgical intervention, discussed with the family that they should notify nursing if another episode of frequent hematochezia as this would be the ideal time to do bleeding scan 5. Per Dr. Lavon PaganiniNandigam, will start slow prep today and tentatively plan for colonoscopy tomorrow with Dr. Nicholes MangoHung/Mann, again, see above, if signs of bleeding during prep 6. Please await any further recs from Dr. Lavon PaganiniNandigam  We will continue to follow along.   LOS: 2 days   Unk LightningJennifer Lynne Kahlan Engebretson  09/29/2015, 9:12 AM  Pager # [phone]

## 2015-09-29 NOTE — Plan of Care (Signed)
Problem: Physical Regulation: Goal: Ability to maintain clinical measurements within normal limits will improve Outcome: Not Progressing Rectal bleeding continues and hemoglobin continues to drop below 7. Transfusions ordered as indicated.

## 2015-09-29 NOTE — Progress Notes (Signed)
[facility] Progress Note  Patient: Tammy [name] EGB:[id number]   PCP: Maximino Greenland, MD DOB: [birth year]   DOA: 09/26/2015   DOS: 09/29/2015   Date of Service: the patient was seen and examined on 09/29/2015  Subjective: The patient denies any acute complaint. Had multiple episodes of blood in the stool yesterday. Nutrition: Tolerating clear liquids  Brief hospital course: Pt. with PMH of hypertension, rectal bleeding; admitted on 09/26/2015, with complaint of rectal bleed, was found to have probable diverticular bleed with symptomatic anemia. S/P 2 PRBC on 09/27/2015 And 1 PRBC on 09/28/2015. Currently further plan is continue monitoring.  Assessment and Plan: 1. Rectal bleeding H&H dropped significantly to 7 from 12. Required transfusion of 2 units of PRBC on 09/27/2015. Repeat PRBC required on 09/28/2015 for 1 unit. Gastroenterology continues to follow. Nuclear scan is negative. Colonoscopy scheduled for 09/30/2015. Continue twice a day PPI. Transfuse as necessary for hemoglobin less than 7.  2. Essential hypertension. Blood pressure relatively stable and therefore antihypertensive medications are on hold. We'll continue to monitor.   Pain management: When necessary Tylenol Activity: No indication for physical therapy Bowel regimen: last BM 09/28/2015 Diet: Clear liquid diet DVT Prophylaxis: mechanical compression device  Advance goals of care discussion: Full code  Family Communication: family was present at bedside, at the time of interview. The pt provided permission to discuss medical plan with the family. Opportunity was given to ask question and all questions were answered satisfactorily.   Disposition:  Discharge to home. Expected discharge date: 09/30/2015, stabilization of hemoglobin  Consultants: Gastroenterology Procedures: Blood transfusion  Antibiotics: Anti-infectives    None        Intake/Output Summary (Last 24 hours) at 09/29/15 1619 Last  data filed at 09/29/15 1329  Gross per 24 hour  Intake           1122.5 ml  Output              503 ml  Net            619.5 ml   Filed Weights   09/26/15 0804  Weight: 66 kg (145 lb 8.1 oz)    Objective: Physical Exam: Vitals:   09/28/15 2349 09/29/15 0252 09/29/15 0440 09/29/15 1329  BP: 107/60 126/61 121/77 (!) 149/84  Pulse: 72 (!) 55 65 69  Resp: '14 16 14 16  ' Temp: 98.5 F (36.9 C) 98.6 F (37 C) 98.5 F (36.9 C) 98.5 F (36.9 C)  TempSrc: Oral Oral Oral Oral  SpO2: 100% 100% 100% 100%  Weight:      Height:        General: Alert, Awake and Oriented to Time, Place and Person. Appear in mild distress Eyes: PERRL, Conjunctiva normal ENT: Oral Mucosa clear moist. Neck: no JVD, no Abnormal Mass Or lumps Cardiovascular: S1 and S2 Present, no Murmur, Respiratory: Bilateral Air entry equal and Decreased, Clear to Auscultation, o Crackles, no wheezes Abdomen: Bowel Sound present, Soft and no tenderness Skin: no redness, no Rash  Extremities: no Pedal edema, no calf tenderness  Data Reviewed: CBC:  Recent Labs Lab 09/26/15 0457  09/28/15 0900 09/28/15 1811 09/28/15 2202 09/29/15 0618 09/29/15 1450  WBC 6.0  < > 6.9 9.5 9.9 7.6 8.1  NEUTROABS 4.4  --   --   --   --   --   --   HGB 10.9*  < > 8.7* 8.1* 6.9* 8.0* 8.3*  HCT 32.9*  < > 25.9* 24.8* 20.3* 23.5* 24.8*  MCV 80.2  < >  82.5 81.8 82.9 79.9 81.8  PLT 285  < > 195 238 174 167 190  < > = values in this interval not displayed. Basic Metabolic Panel:  Recent Labs Lab 09/25/15 1820 09/26/15 0457 09/27/15 0422 09/29/15 0618  NA 142 142 141 141  K 3.4* 3.2* 4.0 3.7  CL 108 110 114* 112*  CO2 '27 26 25 25  ' GLUCOSE 114* 110* 102* 102*  BUN '16 14 9 7  ' CREATININE 0.86 0.75 0.80 0.80  CALCIUM 9.5 9.2 8.5* 8.3*    Liver Function Tests:  Recent Labs Lab 09/25/15 1820  AST 20  ALT 14  ALKPHOS 73  BILITOT 0.3  PROT 7.1  ALBUMIN 4.0   No results for input(s): LIPASE, AMYLASE in the last 168  hours. No results for input(s): AMMONIA in the last 168 hours. Coagulation Profile: No results for input(s): INR, PROTIME in the last 168 hours. Cardiac Enzymes: No results for input(s): CKTOTAL, CKMB, CKMBINDEX, TROPONINI in the last 168 hours. BNP (last 3 results) No results for input(s): PROBNP in the last 8760 hours.  CBG: No results for input(s): GLUCAP in the last 168 hours.  Studies: Nm Gi Blood Loss  Result Date: 09/29/2015 CLINICAL DATA:  Hematochezia, clinically most likely representing diverticular bleed. EXAM: NUCLEAR MEDICINE GASTROINTESTINAL BLEEDING SCAN TECHNIQUE: Sequential abdominal images were obtained following intravenous administration of Tc-42mlabeled red blood cells. RADIOPHARMACEUTICALS:  20.8 mCi Tc-933mn-vitro labeled red cells. COMPARISON:  None. FINDINGS: Imaging was performed over two complete hours and demonstrates no evidence of active GI bleed over that time period. No incidental abnormalities. IMPRESSION: Negative bleeding scan with no evidence of bleeding into the gastrointestinal tract over 2 hours of imaging. Electronically Signed   By: GlAletta Edouard.D.   On: 09/29/2015 13:24     Scheduled Meds: . latanoprost  1 drop Both Eyes QHS  . pantoprazole  40 mg Oral BID  . peg 3350 powder  0.5 kit Oral Once   And  . peg 3350 powder  0.5 kit Oral Once   Continuous Infusions: . 0.9 % NaCl with KCl 20 mEq / L 1,000 mL (09/28/15 1557)   PRN Meds: acetaminophen **OR** acetaminophen, ondansetron **OR** ondansetron (ZOFRAN) IV  Time spent: 30 minutes  Author: PrBerle MullMD [facility] Pager: [phone]/04/2015 4:19 PM  If 7PM-7AM, please contact night-coverage at [email or web address], password [email or web address]

## 2015-09-30 ENCOUNTER — Encounter (HOSPITAL_COMMUNITY): Payer: Self-pay | Admitting: *Deleted

## 2015-09-30 ENCOUNTER — Inpatient Hospital Stay (HOSPITAL_COMMUNITY): Payer: Medicare Other | Admitting: Anesthesiology

## 2015-09-30 ENCOUNTER — Encounter (HOSPITAL_COMMUNITY)
Admission: EM | Disposition: A | Discharge status: Home / Self Care | Payer: Self-pay | Source: Home / Self Care | Attending: Internal Medicine

## 2015-09-30 HISTORY — PX: COLONOSCOPY: SHX5424

## 2015-09-30 LAB — CBC WITH DIFFERENTIAL/PLATELET
BASOS ABS: 0 10*3/uL (ref 0.0–0.1)
Basophils Relative: 0 %
EOS PCT: 6 %
Eosinophils Absolute: 0.5 10*3/uL (ref 0.0–0.7)
HCT: 24.9 % — ABNORMAL LOW (ref 36.0–46.0)
Hemoglobin: 8.2 g/dL — ABNORMAL LOW (ref 12.0–15.0)
LYMPHS PCT: 21 %
Lymphs Abs: 1.5 10*3/uL (ref 0.7–4.0)
MCH: 27.2 pg (ref 26.0–34.0)
MCHC: 32.9 g/dL (ref 30.0–36.0)
MCV: 82.5 fL (ref 78.0–100.0)
MONO ABS: 0.5 10*3/uL (ref 0.1–1.0)
MONOS PCT: 7 %
Neutro Abs: 4.7 10*3/uL (ref 1.7–7.7)
Neutrophils Relative %: 66 %
PLATELETS: 191 10*3/uL (ref 150–400)
RBC: 3.02 MIL/uL — ABNORMAL LOW (ref 3.87–5.11)
RDW: 15.6 % — AB (ref 11.5–15.5)
WBC: 7.2 10*3/uL (ref 4.0–10.5)

## 2015-09-30 LAB — COMPREHENSIVE METABOLIC PANEL
ALT: 12 U/L — ABNORMAL LOW (ref 14–54)
ANION GAP: 4 — AB (ref 5–15)
AST: 19 U/L (ref 15–41)
Albumin: 3.1 g/dL — ABNORMAL LOW (ref 3.5–5.0)
Alkaline Phosphatase: 45 U/L (ref 38–126)
BUN: 8 mg/dL (ref 6–20)
CHLORIDE: 117 mmol/L — AB (ref 101–111)
CO2: 23 mmol/L (ref 22–32)
Calcium: 8.5 mg/dL — ABNORMAL LOW (ref 8.9–10.3)
Creatinine, Ser: 0.82 mg/dL (ref 0.44–1.00)
Glucose, Bld: 95 mg/dL (ref 65–99)
POTASSIUM: 3.7 mmol/L (ref 3.5–5.1)
Sodium: 144 mmol/L (ref 135–145)
TOTAL PROTEIN: 4.9 g/dL — AB (ref 6.5–8.1)
Total Bilirubin: 0.7 mg/dL (ref 0.3–1.2)

## 2015-09-30 LAB — PROTIME-INR
INR: 1.14
PROTHROMBIN TIME: 14.7 s (ref 11.4–15.2)

## 2015-09-30 LAB — MAGNESIUM: MAGNESIUM: 1.8 mg/dL (ref 1.7–2.4)

## 2015-09-30 SURGERY — COLONOSCOPY
Anesthesia: Monitor Anesthesia Care

## 2015-09-30 MED ORDER — PROPOFOL 10 MG/ML IV BOLUS
INTRAVENOUS | Status: AC
  Filled 2015-09-30: qty 20

## 2015-09-30 MED ORDER — SODIUM CHLORIDE 0.9 % IV SOLN: INTRAVENOUS | Status: DC

## 2015-09-30 MED ORDER — PROPOFOL 10 MG/ML IV BOLUS
INTRAVENOUS | Status: AC
  Filled 2015-09-30: qty 40

## 2015-09-30 MED ORDER — LACTATED RINGERS IV SOLN
INTRAVENOUS | Status: DC | PRN
  Administered 2015-09-30: 13:00:00 via INTRAVENOUS

## 2015-09-30 MED ORDER — PROPOFOL 500 MG/50ML IV EMUL
INTRAVENOUS | Status: DC | PRN
  Administered 2015-09-30: 300 ug/kg/min via INTRAVENOUS

## 2015-09-30 NOTE — Progress Notes (Signed)
[facility] Progress Note  Patient: Tammy [name] ZOX:[id number]   PCP: Maximino Greenland, MD DOB: 1933/12/24   DOA: 09/26/2015   DOS: 09/30/2015   Date of Service: the patient was seen and examined on 09/30/2015  Subjective: the pt does not have any BM with blood today Nutrition: Tolerating clear liquids  Brief hospital course: Pt. with PMH of hypertension, rectal bleeding; admitted on 09/26/2015, with complaint of rectal bleed, was found to have probable diverticular bleed with symptomatic anemia. S/P 2 PRBC on 09/27/2015 And 1 PRBC on 09/28/2015. NM bleeding scan was negative, colonoscopy was not suggestive of active bleeding but was Suggestive of extensive diverticulosis and left-sided suspected source of bleeding since blood was identified distal to splenic flexure. Currently further plan is continue monitoring.  Assessment and Plan: 1. Rectal bleeding H&H dropped significantly and Required transfusion of 2 units of PRBC on 09/27/2015. Repeat PRBC required on 09/28/2015 for 1 unit. Gastroenterology continues to follow. Nuclear scan is negative. Colonoscopy shows no active bleeding but evidence of blood coming from left side distal to splenic flexure. Continue twice a day PPI. Transfuse as necessary for hemoglobin less than 7. May require IR guided procedure if continues to have bleeding. Diet is advance as per GI  2. Essential hypertension. Blood pressure relatively stable and therefore antihypertensive medications are on hold. We'll continue to monitor.   Pain management: When necessary Tylenol Activity: No indication for physical therapy Bowel regimen: last BM 09/28/2015 Diet: Full diet cardiac DVT Prophylaxis: mechanical compression device  Advance goals of care discussion: Full code  Family Communication: family was present at bedside, at the time of interview. The pt provided permission to discuss medical plan with the family. Opportunity was given to ask question and all  questions were answered satisfactorily.   Disposition:  Discharge to home. Expected discharge date: 10/01/2015, stabilization of hemoglobin  Consultants: Gastroenterology Procedures: Blood transfusion  Antibiotics: Anti-infectives    None        Intake/Output Summary (Last 24 hours) at 09/30/15 1840 Last data filed at 09/30/15 1438  Gross per 24 hour  Intake          3868.75 ml  Output                0 ml  Net          3868.75 ml   Filed Weights   09/26/15 0804  Weight: 66 kg (145 lb 8.1 oz)    Objective: Physical Exam: Vitals:   09/30/15 1254 09/30/15 1438 09/30/15 1440 09/30/15 1450  BP: (!) 159/65 (!) 96/31 (!) 107/46 (!) 118/53  Pulse: (!) 57 63 (!) 57 68  Resp: '15 14 15 16  ' Temp: 98.8 F (37.1 C)     TempSrc: Oral     SpO2: 100% 100% 100% 100%  Weight:      Height:        General: Alert, Awake and Oriented to Time, Place and Person. Appear in mild distress Eyes: PERRL, Conjunctiva normal ENT: Oral Mucosa clear moist. Neck: no JVD, no Abnormal Mass Or lumps Cardiovascular: S1 and S2 Present, no Murmur, Respiratory: Bilateral Air entry equal and Decreased, Clear to Auscultation, o Crackles, no wheezes Abdomen: Bowel Sound present, Soft and no tenderness Skin: no redness, no Rash  Extremities: no Pedal edema, no calf tenderness  Data Reviewed: CBC:  Recent Labs Lab 09/26/15 0457  09/28/15 1811 09/28/15 2202 09/29/15 0618 09/29/15 1450 09/30/15 0423  WBC 6.0  < > 9.5 9.9 7.6 8.1  7.2  NEUTROABS 4.4  --   --   --   --   --  4.7  HGB 10.9*  < > 8.1* 6.9* 8.0* 8.3* 8.2*  HCT 32.9*  < > 24.8* 20.3* 23.5* 24.8* 24.9*  MCV 80.2  < > 81.8 82.9 79.9 81.8 82.5  PLT 285  < > 238 174 167 190 191  < > = values in this interval not displayed. Basic Metabolic Panel:  Recent Labs Lab 09/25/15 1820 09/26/15 0457 09/27/15 0422 09/29/15 0618 09/30/15 0423  NA 142 142 141 141 144  K 3.4* 3.2* 4.0 3.7 3.7  CL 108 110 114* 112* 117*  CO2 '27 26 25 25 23    ' GLUCOSE 114* 110* 102* 102* 95  BUN '16 14 9 7 8  ' CREATININE 0.86 0.75 0.80 0.80 0.82  CALCIUM 9.5 9.2 8.5* 8.3* 8.5*  MG  --   --   --   --  1.8    Liver Function Tests:  Recent Labs Lab 09/25/15 1820 09/30/15 0423  AST 20 19  ALT 14 12*  ALKPHOS 73 45  BILITOT 0.3 0.7  PROT 7.1 4.9*  ALBUMIN 4.0 3.1*   No results for input(s): LIPASE, AMYLASE in the last 168 hours. No results for input(s): AMMONIA in the last 168 hours. Coagulation Profile:  Recent Labs Lab 09/30/15 0423  INR 1.14   Cardiac Enzymes: No results for input(s): CKTOTAL, CKMB, CKMBINDEX, TROPONINI in the last 168 hours. BNP (last 3 results) No results for input(s): PROBNP in the last 8760 hours.  CBG: No results for input(s): GLUCAP in the last 168 hours.  Studies: No results found.   Scheduled Meds: . latanoprost  1 drop Both Eyes QHS  . pantoprazole  40 mg Oral BID  . peg 3350 powder  0.5 kit Oral Once   Continuous Infusions:   PRN Meds: acetaminophen **OR** acetaminophen, ondansetron **OR** ondansetron (ZOFRAN) IV  Time spent: 30 minutes  Author: Berle Mull, MD [facility] Pager: [phone] 09/30/2015 6:40 PM  If 7PM-7AM, please contact night-coverage at [email or web address], password [facility]

## 2015-09-30 NOTE — H&P (View-Only) (Signed)
[facility] Progress Note  Patient: Tammy [name]:[id number]   PCP: Maximino Greenland, MD DOB: 04-23-1933   DOA: 09/26/2015   DOS: 09/29/2015   Date of Service: the patient was seen and examined on 09/29/2015  Subjective: The patient denies any acute complaint. Had multiple episodes of blood in the stool yesterday. Nutrition: Tolerating clear liquids  Brief hospital course: Pt. with PMH of hypertension, rectal bleeding; admitted on 09/26/2015, with complaint of rectal bleed, was found to have probable diverticular bleed with symptomatic anemia. S/P 2 PRBC on 09/27/2015 And 1 PRBC on 09/28/2015. Currently further plan is continue monitoring.  Assessment and Plan: 1. Rectal bleeding H&H dropped significantly to 7 from 12. Required transfusion of 2 units of PRBC on 09/27/2015. Repeat PRBC required on 09/28/2015 for 1 unit. Gastroenterology continues to follow. Nuclear scan is negative. Colonoscopy scheduled for 09/30/2015. Continue twice a day PPI. Transfuse as necessary for hemoglobin less than 7.  2. Essential hypertension. Blood pressure relatively stable and therefore antihypertensive medications are on hold. We'll continue to monitor.   Pain management: When necessary Tylenol Activity: No indication for physical therapy Bowel regimen: last BM 09/28/2015 Diet: Clear liquid diet DVT Prophylaxis: mechanical compression device  Advance goals of care discussion: Full code  Family Communication: family was present at bedside, at the time of interview. The pt provided permission to discuss medical plan with the family. Opportunity was given to ask question and all questions were answered satisfactorily.   Disposition:  Discharge to home. Expected discharge date: 09/30/2015, stabilization of hemoglobin  Consultants: Gastroenterology Procedures: Blood transfusion  Antibiotics: Anti-infectives    None        Intake/Output Summary (Last 24 hours) at 09/29/15 1619 Last  data filed at 09/29/15 1329  Gross per 24 hour  Intake           1122.5 ml  Output              503 ml  Net            619.5 ml   Filed Weights   09/26/15 0804  Weight: 66 kg (145 lb 8.1 oz)    Objective: Physical Exam: Vitals:   09/28/15 2349 09/29/15 0252 09/29/15 0440 09/29/15 1329  BP: 107/60 126/61 121/77 (!) 149/84  Pulse: 72 (!) 55 65 69  Resp: '14 16 14 16  ' Temp: 98.5 F (36.9 C) 98.6 F (37 C) 98.5 F (36.9 C) 98.5 F (36.9 C)  TempSrc: Oral Oral Oral Oral  SpO2: 100% 100% 100% 100%  Weight:      Height:        General: Alert, Awake and Oriented to Time, Place and Person. Appear in mild distress Eyes: PERRL, Conjunctiva normal ENT: Oral Mucosa clear moist. Neck: no JVD, no Abnormal Mass Or lumps Cardiovascular: S1 and S2 Present, no Murmur, Respiratory: Bilateral Air entry equal and Decreased, Clear to Auscultation, o Crackles, no wheezes Abdomen: Bowel Sound present, Soft and no tenderness Skin: no redness, no Rash  Extremities: no Pedal edema, no calf tenderness  Data Reviewed: CBC:  Recent Labs Lab 09/26/15 0457  09/28/15 0900 09/28/15 1811 09/28/15 2202 09/29/15 0618 09/29/15 1450  WBC 6.0  < > 6.9 9.5 9.9 7.6 8.1  NEUTROABS 4.4  --   --   --   --   --   --   HGB 10.9*  < > 8.7* 8.1* 6.9* 8.0* 8.3*  HCT 32.9*  < > 25.9* 24.8* 20.3* 23.5* 24.8*  MCV 80.2  < >  82.5 81.8 82.9 79.9 81.8  PLT 285  < > 195 238 174 167 190  < > = values in this interval not displayed. Basic Metabolic Panel:  Recent Labs Lab 09/25/15 1820 09/26/15 0457 09/27/15 0422 09/29/15 0618  NA 142 142 141 141  K 3.4* 3.2* 4.0 3.7  CL 108 110 114* 112*  CO2 '27 26 25 25  ' GLUCOSE 114* 110* 102* 102*  BUN '16 14 9 7  ' CREATININE 0.86 0.75 0.80 0.80  CALCIUM 9.5 9.2 8.5* 8.3*    Liver Function Tests:  Recent Labs Lab 09/25/15 1820  AST 20  ALT 14  ALKPHOS 73  BILITOT 0.3  PROT 7.1  ALBUMIN 4.0   No results for input(s): LIPASE, AMYLASE in the last 168  hours. No results for input(s): AMMONIA in the last 168 hours. Coagulation Profile: No results for input(s): INR, PROTIME in the last 168 hours. Cardiac Enzymes: No results for input(s): CKTOTAL, CKMB, CKMBINDEX, TROPONINI in the last 168 hours. BNP (last 3 results) No results for input(s): PROBNP in the last 8760 hours.  CBG: No results for input(s): GLUCAP in the last 168 hours.  Studies: Nm Gi Blood Loss  Result Date: 09/29/2015 CLINICAL DATA:  Hematochezia, clinically most likely representing diverticular bleed. EXAM: NUCLEAR MEDICINE GASTROINTESTINAL BLEEDING SCAN TECHNIQUE: Sequential abdominal images were obtained following intravenous administration of Tc-58mlabeled red blood cells. RADIOPHARMACEUTICALS:  20.8 mCi Tc-968mn-vitro labeled red cells. COMPARISON:  None. FINDINGS: Imaging was performed over two complete hours and demonstrates no evidence of active GI bleed over that time period. No incidental abnormalities. IMPRESSION: Negative bleeding scan with no evidence of bleeding into the gastrointestinal tract over 2 hours of imaging. Electronically Signed   By: GlAletta Edouard.D.   On: 09/29/2015 13:24     Scheduled Meds: . latanoprost  1 drop Both Eyes QHS  . pantoprazole  40 mg Oral BID  . peg 3350 powder  0.5 kit Oral Once   And  . peg 3350 powder  0.5 kit Oral Once   Continuous Infusions: . 0.9 % NaCl with KCl 20 mEq / L 1,000 mL (09/28/15 1557)   PRN Meds: acetaminophen **OR** acetaminophen, ondansetron **OR** ondansetron (ZOFRAN) IV  Time spent: 30 minutes  Author: PrBerle MullMD [facility] Pager: [phone]/04/2015 4:19 PM  If 7PM-7AM, please contact night-coverage at [email or web address], password [email or web address]

## 2015-09-30 NOTE — Anesthesia Preprocedure Evaluation (Signed)
Anesthesia Evaluation  Patient identified by MRN, date of birth, ID band Patient awake    Reviewed: Allergy & Precautions, NPO status , Patient's Chart, lab work & pertinent test results  Airway Mallampati: I  TM Distance: >3 FB Neck ROM: Full    Dental   Pulmonary    Pulmonary exam normal        Cardiovascular hypertension, Pt. on medications Normal cardiovascular exam     Neuro/Psych    GI/Hepatic GERD  Medicated and Controlled,  Endo/Other    Renal/GU      Musculoskeletal   Abdominal   Peds  Hematology   Anesthesia Other Findings   Reproductive/Obstetrics                             Anesthesia Physical Anesthesia Plan  ASA: II  Anesthesia Plan: MAC   Post-op Pain Management:    Induction: Intravenous  Airway Management Planned: Simple Face Mask  Additional Equipment:   Intra-op Plan:   Post-operative Plan:   Informed Consent: I have reviewed the patients History and Physical, chart, labs and discussed the procedure including the risks, benefits and alternatives for the proposed anesthesia with the patient or authorized representative who has indicated his/her understanding and acceptance.     Plan Discussed with: CRNA and Surgeon  Anesthesia Plan Comments:         Anesthesia Quick Evaluation  

## 2015-09-30 NOTE — Op Note (Signed)
[facility] Name: Tammy GripLillie [name] Procedure Date: 09/30/2015 MRN: [id number] Attending MD: Jeani HawkingPatrick Heinrich Fertig , MD Date of Birth: 11/30/1933 CSN: [id number] Age: [age] Admit Type: Inpatient Procedure:                Colonoscopy Indications:              Hematochezia Providers:                Jeani HawkingPatrick Vivaan Helseth, MD, Anthony Saraniel Madden, RN, Kandice RobinsonsGuillaume                            [place], Technician Referring MD:              Medicines:                Propofol per Anesthesia Complications:            No immediate complications. Estimated Blood Loss:     Estimated blood loss: none. Procedure:                Pre-Anesthesia Assessment:                           - Prior to the procedure, a History and Physical                            was performed, and patient medications and                            allergies were reviewed. The patient's tolerance of                            previous anesthesia was also reviewed. The risks                            and benefits of the procedure and the sedation                            options and risks were discussed with the patient.                            All questions were answered, and informed consent                            was obtained. Prior Anticoagulants: The patient has                            taken no previous anticoagulant or antiplatelet                            agents. ASA Grade Assessment: II - A patient with                            mild systemic disease. After reviewing the risks                            and benefits, the patient  was deemed in                            satisfactory condition to undergo the procedure.                           - Sedation was administered by an anesthesia                            professional. Deep sedation was attained.                           After obtaining informed consent, the colonoscope                            was passed under direct vision. Throughout the                  procedure, the patient's blood pressure, pulse, and                            oxygen saturations were monitored continuously. The                            EC-3890LI ([id number]) scope was introduced through                            the anus and advanced to the the cecum, identified                            by appendiceal orifice and ileocecal valve. The                            colonoscopy was performed without difficulty. The                            patient tolerated the procedure well. The quality                            of the bowel preparation was good. The ileocecal                            valve, appendiceal orifice, and rectum were                            photographed. Scope In: 2:11:14 PM Scope Out: 2:31:57 PM Total Procedure Duration: 0 hours 20 minutes 43 seconds  Findings:      Scattered small and large-mouthed diverticula were found in the sigmoid       colon, descending colon, transverse colon and ascending colon.      The source of the bleeding was from the left side and the presumption is       that it is from a diveticular source. There was no evidence of blood       proximal to the splenic flexure (Image 3) and distal to this area blood  was identified (Image 4). Even though there was a sharp contrast in the       areas of bleeding and nonbleeding, there was no evidence of ischemic       colitis. Impression:               - Diverticulosis in the sigmoid colon, in the                            descending colon, in the transverse colon and in                            the ascending colon.                           - No specimens collected. Moderate Sedation:      N/A- Per Anesthesia Care Recommendation:           - Return patient to hospital ward for ongoing care.                           - Resume regular diet.                           - Continue present medications.                           - If bleeding continues a left sided  source needs                            to be treated through IR or surgically.                           - No repeat colonoscopy due to age and the absence                            of advanced adenomas. Procedure Code(s):        --- Professional ---                           [phone], Colonoscopy, flexible; diagnostic, including                            collection of specimen(s) by brushing or washing,                            when performed (separate procedure) Diagnosis Code(s):        --- Professional ---                           K92.1, Melena (includes Hematochezia)                           K57.30, Diverticulosis of large intestine without                            perforation or abscess without bleeding CPT copyright 2016 American  Medical Association. All rights reserved. The codes documented in this report are preliminary and upon coder review may  be revised to meet current compliance requirements. Jeani Hawking, MD Jeani Hawking, MD 09/30/2015 2:39:27 PM This report has been signed electronically. Number of Addenda: 0

## 2015-09-30 NOTE — Interval H&P Note (Signed)
History and Physical Interval Note:  09/30/2015 2:01 PM  Tammy [name]  has presented today for surgery, with the diagnosis of Hematochezia  The various methods of treatment have been discussed with the patient and family. After consideration of risks, benefits and other options for treatment, the patient has consented to  Procedure(s): COLONOSCOPY (N/A) as a surgical intervention .  The patient's history has been reviewed, patient examined, no change in status, stable for surgery.  I have reviewed the patient's chart and labs.  Questions were answered to the patient's satisfaction.     Melissia Lahman D

## 2015-09-30 NOTE — Anesthesia Postprocedure Evaluation (Signed)
Anesthesia Post Note  Patient: Tammy [name]  Procedure(s) Performed: Procedure(s) (LRB): COLONOSCOPY (N/A)  Patient location during evaluation: PACU Anesthesia Type: MAC Level of consciousness: awake and alert Pain management: pain level controlled Vital Signs Assessment: post-procedure vital signs reviewed and stable Respiratory status: spontaneous breathing Cardiovascular status: stable Anesthetic complications: no    Last Vitals:  Vitals:   09/30/15 1440 09/30/15 1450  BP: (!) 107/46 (!) 118/53  Pulse: (!) 57 68  Resp: 15 16  Temp:      Last Pain:  Vitals:   09/30/15 1254  TempSrc: Oral  PainSc:                  Lewie LoronJohn Joel Cowin

## 2015-09-30 NOTE — Transfer of Care (Signed)
Immediate Anesthesia Transfer of Care Note  Patient: Tammy [name]  Procedure(s) Performed: Procedure(s): COLONOSCOPY (N/A)  Patient Location: PACU  Anesthesia Type:MAC  Level of Consciousness: sedated and patient cooperative  Airway & Oxygen Therapy: Patient Spontanous Breathing and Patient connected to face mask oxygen  Post-op Assessment: Report given to RN, Post -op Vital signs reviewed and stable and Patient moving all extremities X 4  Post vital signs: stable  Last Vitals:  Vitals:   09/30/15 1254 09/30/15 1438  BP: (!) 159/65 (!) 96/31  Pulse: (!) 57 63  Resp: 15 14  Temp: 37.1 C     Last Pain:  Vitals:   09/30/15 1254  TempSrc: Oral  PainSc:       Patients Stated Pain Goal: 0 (09/28/15 0906)  Complications: No apparent anesthesia complications

## 2015-10-01 ENCOUNTER — Encounter (HOSPITAL_COMMUNITY): Payer: Self-pay | Admitting: Gastroenterology

## 2015-10-01 DIAGNOSIS — I10 Essential (primary) hypertension: Secondary | ICD-10-CM

## 2015-10-01 LAB — CBC
HCT: 22.5 % — ABNORMAL LOW (ref 36.0–46.0)
HEMOGLOBIN: 7.5 g/dL — AB (ref 12.0–15.0)
MCH: 27.8 pg (ref 26.0–34.0)
MCHC: 33.3 g/dL (ref 30.0–36.0)
MCV: 83.3 fL (ref 78.0–100.0)
PLATELETS: 236 10*3/uL (ref 150–400)
RBC: 2.7 MIL/uL — AB (ref 3.87–5.11)
RDW: 16 % — ABNORMAL HIGH (ref 11.5–15.5)
WBC: 7.2 10*3/uL (ref 4.0–10.5)

## 2015-10-01 LAB — HEMOGLOBIN AND HEMATOCRIT, BLOOD
HEMATOCRIT: 22.8 % — AB (ref 36.0–46.0)
HEMOGLOBIN: 7.6 g/dL — AB (ref 12.0–15.0)

## 2015-10-01 NOTE — Progress Notes (Signed)
Subjective: Patient seems to be doing well today. Had 2 BM's this morning without any blood in the stool. She denies having any abdominal pain, nausea fever or chills. Course since admission noted.   Objective: Vital signs in last 24 hours: Temp:  [98.2 F (36.8 C)-99.2 F (37.3 C)] 99.1 F (37.3 C) (09/06 1443) Pulse Rate:  [60-76] 76 (09/06 1443) Resp:  [16-18] 18 (09/06 1443) BP: (105-117)/(54-63) 117/57 (09/06 1443) SpO2:  [100 %] 100 % (09/06 1443) Last BM Date: 10/01/15  Intake/Output from previous day: 09/05 0701 - 09/06 0700 In: 900 [I.V.:900] Out: -  Intake/Output this shift: No intake/output data recorded.  General appearance: alert, cooperative, appears stated age and no distress Resp: clear to auscultation bilaterally Cardio: regular rate and rhythm, S1, S2 normal, no murmur, click, rub or gallop GI: soft, non-tender; bowel sounds normal; no masses,  no organomegaly  Lab Results:  Recent Labs  09/29/15 1450 09/30/15 0423 10/01/15 0430 10/01/15 1032  WBC 8.1 7.2 7.2  --   HGB 8.3* 8.2* 7.5* 7.6*  HCT 24.8* 24.9* 22.5* 22.8*  PLT 190 191 236  --    BMET  Recent Labs  09/29/15 0618 09/30/15 0423  NA 141 144  K 3.7 3.7  CL 112* 117*  CO2 25 23  GLUCOSE 102* 95  BUN 7 8  CREATININE 0.80 0.82  CALCIUM 8.3* 8.5*   LFT  Recent Labs  09/30/15 0423  PROT 4.9*  ALBUMIN 3.1*  AST 19  ALT 12*  ALKPHOS 45  BILITOT 0.7   PT/INR  Recent Labs  09/30/15 0423  LABPROT 14.7  INR 1.14    Medications: I have reviewed the patient's current medications.  Assessment/Plan: 1) Severe anemia s/p lower GI bleed from sigmoid diverticulosis. Patient is doing well and anticipates discharge tomorrow. I will see her in follow up on an OP basis. 2) Malnutrition.   LOS: 4 days   Tammy [name] 10/01/2015, 8:05 PM

## 2015-10-01 NOTE — Progress Notes (Signed)
PROGRESS NOTE    Tammy [name]  ZHY:[id number] DOB: 04/11/1933 DOA: 09/26/2015 PCP: Gwynneth AlimentSANDERS,[name], MD   Outpatient Specialists:    Brief Narrative:  Pt. with PMH of hypertension, rectal bleeding; admitted on 09/26/2015, with complaint of rectal bleed, was found to have probable diverticular bleed with symptomatic anemia. S/P 2 PRBC on 09/27/2015 And 1 PRBC on 09/28/2015. NM bleeding scan was negative, colonoscopy was not suggestive of active bleeding but was Suggestive of extensive diverticulosis and left-sided suspected source of bleeding since blood was identified distal to splenic flexure.    Assessment & Plan:   Principal Problem:   Rectal bleeding Active Problems:   Essential hypertension   Acute blood loss anemia   Hypokalemia   Lower GI bleed   Diverticulosis of large intestine with hemorrhage   Rectal bleeding S/p 3 units PRBC Nuclear scan negative. Colonoscopy shows no active bleeding but evidence of blood coming from left side distal to splenic flexure. Continue twice a day PPI. Transfuse as necessary for hemoglobin less than 7. May require IR guided procedure if continues to have bleeding. - had blood in stool yesterday around 3PM  Essential hypertension. Blood pressure relatively stable and therefore antihypertensive medications are on hold. We'll continue to monitor.   DVT prophylaxis:  SCD's  Code Status: Full Code   Family Communication: patient  Disposition Plan:  Home in AM   Consultants:   GI  Procedures:   colonscopy  Antimicrobials:      Subjective: Feeling better -wants to go home  Objective: Vitals:   09/30/15 1440 09/30/15 1450 09/30/15 2150 10/01/15 0533  BP: (!) 107/46 (!) 118/53 (!) 105/54 112/63  Pulse: (!) 57 68 70 60  Resp: 15 16 16 16   Temp:   99.2 F (37.3 C) 98.2 F (36.8 C)  TempSrc:   Oral Oral  SpO2: 100% 100% 100% 100%  Weight:      Height:        Intake/Output Summary (Last 24 hours) at  10/01/15 1325 Last data filed at 10/01/15 0915  Gross per 24 hour  Intake             1140 ml  Output                0 ml  Net             1140 ml   Filed Weights   09/26/15 0804  Weight: 66 kg (145 lb 8.1 oz)    Examination:  General exam: Appears calm and comfortable  Respiratory system: Clear to auscultation. Respiratory effort normal. Cardiovascular system: S1 & S2 heard, RRR. No JVD, murmurs, rubs, gallops or clicks. No pedal edema. Gastrointestinal system: Abdomen is nondistended, soft and nontender. No organomegaly or masses felt. Normal bowel sounds heard. Central nervous system: Alert and oriented. No focal neurological deficits.     Data Reviewed: I have personally reviewed following labs and imaging studies  CBC:  Recent Labs Lab 09/26/15 0457  09/28/15 2202 09/29/15 0618 09/29/15 1450 09/30/15 0423 10/01/15 0430 10/01/15 1032  WBC 6.0  < > 9.9 7.6 8.1 7.2 7.2  --   NEUTROABS 4.4  --   --   --   --  4.7  --   --   HGB 10.9*  < > 6.9* 8.0* 8.3* 8.2* 7.5* 7.6*  HCT 32.9*  < > 20.3* 23.5* 24.8* 24.9* 22.5* 22.8*  MCV 80.2  < > 82.9 79.9 81.8 82.5 83.3  --   PLT 285  < >  174 167 190 191 236  --   < > = values in this interval not displayed. Basic Metabolic Panel:  Recent Labs Lab 09/25/15 1820 09/26/15 0457 09/27/15 0422 09/29/15 0618 09/30/15 0423  NA 142 142 141 141 144  K 3.4* 3.2* 4.0 3.7 3.7  CL 108 110 114* 112* 117*  CO2 27 26 25 25 23   GLUCOSE 114* 110* 102* 102* 95  BUN 16 14 9 7 8   CREATININE 0.86 0.75 0.80 0.80 0.82  CALCIUM 9.5 9.2 8.5* 8.3* 8.5*  MG  --   --   --   --  1.8   GFR: Estimated Creatinine Clearance: 53.2 mL/min (by C-G formula based on SCr of 0.82 mg/dL). Liver Function Tests:  Recent Labs Lab 09/25/15 1820 09/30/15 0423  AST 20 19  ALT 14 12*  ALKPHOS 73 45  BILITOT 0.3 0.7  PROT 7.1 4.9*  ALBUMIN 4.0 3.1*   No results for input(s): LIPASE, AMYLASE in the last 168 hours. No results for input(s): AMMONIA in  the last 168 hours. Coagulation Profile:  Recent Labs Lab 09/30/15 0423  INR 1.14   Cardiac Enzymes: No results for input(s): CKTOTAL, CKMB, CKMBINDEX, TROPONINI in the last 168 hours. BNP (last 3 results) No results for input(s): PROBNP in the last 8760 hours. HbA1C: No results for input(s): HGBA1C in the last 72 hours. CBG: No results for input(s): GLUCAP in the last 168 hours. Lipid Profile: No results for input(s): CHOL, HDL, LDLCALC, TRIG, CHOLHDL, LDLDIRECT in the last 72 hours. Thyroid Function Tests: No results for input(s): TSH, T4TOTAL, FREET4, T3FREE, THYROIDAB in the last 72 hours. Anemia Panel: No results for input(s): VITAMINB12, FOLATE, FERRITIN, TIBC, IRON, RETICCTPCT in the last 72 hours. Urine analysis:    Component Value Date/Time   COLORURINE YELLOW 04/01/2011 0904   APPEARANCEUR CLOUDY (A) 04/01/2011 0904   LABSPEC 1.017 04/01/2011 0904   PHURINE 7.0 04/01/2011 0904   GLUCOSEU NEGATIVE 04/01/2011 0904   HGBUR NEGATIVE 04/01/2011 0904   BILIRUBINUR NEGATIVE 04/01/2011 0904   KETONESUR NEGATIVE 04/01/2011 0904   PROTEINUR NEGATIVE 04/01/2011 0904   UROBILINOGEN 0.2 04/01/2011 0904   NITRITE NEGATIVE 04/01/2011 0904   LEUKOCYTESUR NEGATIVE 04/01/2011 0904     )No results found for this or any previous visit (from the past 240 hour(s)).    Anti-infectives    None       Radiology Studies: No results found.      Scheduled Meds: . latanoprost  1 drop Both Eyes QHS  . pantoprazole  40 mg Oral BID   Continuous Infusions:    LOS: 4 days    Time spent: 15 min    Tammy Common Juanetta Gosling, DO [facility] Pager [phone]  If 7PM-7AM, please contact night-coverage [email or web address] Password [email or web address] 10/01/2015, 1:25 PM

## 2015-10-01 NOTE — Care Management Important Message (Signed)
Important Message  Patient Details  Name: Tammy [name] MRN: [id number] Date of Birth: 07/22/1933   Medicare Important Message Given:  Yes    Haskell FlirtJamison, Jocie Meroney 10/01/2015, 11:32 AMImportant Message  Patient Details  Name: Tammy HillockLillie M [name] MRN: [id number] Date of Birth: 08/28/1933   Medicare Important Message Given:  Yes    Haskell FlirtJamison, Oluwatobi Visser 10/01/2015, 11:32 AM

## 2015-10-01 NOTE — Care Management Note (Signed)
Case Management Note  Patient Details  Name: Tammy [name] MRN: [id number] Date of Birth: 08/28/1933  Subjective/Objective:     82 yo admitted with Rectal Bleeding               Action/Plan: From home alone. Per nursing staff pt is walking around in room independently. Chart reviewed and CM following for DC needs.  Expected Discharge Date:   (unknown)               Expected Discharge Plan:  Home/Self Care  In-House Referral:     Discharge planning Services  CM Consult  Post Acute Care Choice:    Choice offered to:     DME Arranged:    DME Agency:     HH Arranged:    HH Agency:     Status of Service:  In process, will continue to follow  If discussed at Long Length of Stay Meetings, dates discussed:    Additional CommentsBartholome Bill:  Tammy Godman H, RN 10/01/2015, 2:46 PM  [phone]

## 2015-10-02 DIAGNOSIS — E876 Hypokalemia: Secondary | ICD-10-CM

## 2015-10-02 LAB — HEMOGLOBIN AND HEMATOCRIT, BLOOD
HCT: 21.9 % — ABNORMAL LOW (ref 36.0–46.0)
Hemoglobin: 7.1 g/dL — ABNORMAL LOW (ref 12.0–15.0)

## 2015-10-02 LAB — PREPARE RBC (CROSSMATCH)

## 2015-10-02 MED ORDER — SODIUM CHLORIDE 0.9 % IV SOLN: Freq: Once | INTRAVENOUS | Status: DC

## 2015-10-02 NOTE — Progress Notes (Signed)
Nursing Discharge Summary  Patient ID: Tammy HillockLillie M [name] MRN: [id number] DOB/AGE: 80/09/1933 80 y.o.  Admit date: 09/26/2015 Discharge date: 10/02/2015  Discharged Condition: good  Disposition: 01-Home or Self Care    Prescriptions Given: No prescriptions given.  Patient follow up appointments and medications discussed.  Patient and niece both verbalized understanding without further questions.    Means of Discharge: Patient to be taken downstairs via wheelchair to be discharged home via private vehicle.   Signed: Gloriajean DellBaldwin, Aleera Gilcrease Danielle 10/02/2015, 1:50 PM

## 2015-10-02 NOTE — Discharge Summary (Signed)
Physician Discharge Summary  Tammy HillockLillie M Maione ZOX:[id number] DOB: 09/20/1933 DOA: 09/26/2015  PCP: Gwynneth AlimentSANDERS,[name], MD  Admit date: 09/26/2015 Discharge date: 10/02/2015   Recommendations for Outpatient Follow-Up:   1. Cbc 1 week (patient d/c'd before H/H could be done after PRBC 1 unit on 9/7)   Discharge Diagnosis:   Principal Problem:   Rectal bleeding Active Problems:   Essential hypertension   Acute blood loss anemia   Hypokalemia   Lower GI bleed   Diverticulosis of large intestine with hemorrhage   Discharge disposition:  Home  Discharge Condition: Improved.  Diet recommendation: Regular.  Wound care: None.   History of Present Illness:   Tammy [name] is a 82 y.o. female with a past medical history significant for HTN who presents with rectal bleeding today.  The patient was in her usual state of health (lives alone independently, drives, active) until this morning she woke with the urge to urinate, went to the bathroom and had a bloody bowel movement instead, red blood filling the toilet bowel.  She came to the ER then, was evaluated, had tachycardia but normal hemoglobin, appeared to have "scant bleeding dating from a hemorrhoid" and was discharged home.  She got home, felt the urge to go to the bathroom again, and had more bright red blood per rectum, so called 9-1-1.    Hospital Course by Problem:   Rectal bleeding S/p 3 units PRBC Nuclear scan negative. Colonoscopy shows no active bleeding but evidence of blood coming from left side distal to splenic flexure. Continue twice a day PPI. May require IR guided procedure if continues to have bleeding. Normal BM today  Essential hypertension. Blood pressure relatively stable and therefore antihypertensive medications are on hold. We'll continue to monitor.    Medical Consultants:    GI   Discharge Exam:   Vitals:   10/01/15 2114 10/02/15 0635  BP: 124/72 139/89  Pulse: 66 79  Resp: 20 20    Temp: 98.8 F (37.1 C) 98.7 F (37.1 C)   Vitals:   10/01/15 0533 10/01/15 1443 10/01/15 2114 10/02/15 0635  BP: 112/63 (!) 117/57 124/72 139/89  Pulse: 60 76 66 79  Resp: 16 18 20 20   Temp: 98.2 F (36.8 C) 99.1 F (37.3 C) 98.8 F (37.1 C) 98.7 F (37.1 C)  TempSrc: Oral Oral Oral Oral  SpO2: 100% 100% 100% 100%  Weight:      Height:        Gen:  NAD    The results of significant diagnostics from this hospitalization (including imaging, microbiology, ancillary and laboratory) are listed below for reference.     Procedures and Diagnostic Studies:   No results found.   Labs:   Basic Metabolic Panel:  Recent Labs Lab 09/25/15 1820 09/26/15 0457 09/27/15 0422 09/29/15 0618 09/30/15 0423  NA 142 142 141 141 144  K 3.4* 3.2* 4.0 3.7 3.7  CL 108 110 114* 112* 117*  CO2 27 26 25 25 23   GLUCOSE 114* 110* 102* 102* 95  BUN 16 14 9 7 8   CREATININE 0.86 0.75 0.80 0.80 0.82  CALCIUM 9.5 9.2 8.5* 8.3* 8.5*  MG  --   --   --   --  1.8   GFR Estimated Creatinine Clearance: 53.2 mL/min (by C-G formula based on SCr of 0.82 mg/dL). Liver Function Tests:  Recent Labs Lab 09/25/15 1820 09/30/15 0423  AST 20 19  ALT 14 12*  ALKPHOS 73 45  BILITOT 0.3 0.7  PROT 7.1 4.9*  ALBUMIN 4.0 3.1*   No results for input(s): LIPASE, AMYLASE in the last 168 hours. No results for input(s): AMMONIA in the last 168 hours. Coagulation profile  Recent Labs Lab 09/30/15 0423  INR 1.14    CBC:  Recent Labs Lab 09/26/15 0457  09/28/15 2202 09/29/15 0618 09/29/15 1450 09/30/15 0423 10/01/15 0430 10/01/15 1032 10/02/15 0352  WBC 6.0  < > 9.9 7.6 8.1 7.2 7.2  --   --   NEUTROABS 4.4  --   --   --   --  4.7  --   --   --   HGB 10.9*  < > 6.9* 8.0* 8.3* 8.2* 7.5* 7.6* 7.1*  HCT 32.9*  < > 20.3* 23.5* 24.8* 24.9* 22.5* 22.8* 21.9*  MCV 80.2  < > 82.9 79.9 81.8 82.5 83.3  --   --   PLT 285  < > 174 167 190 191 236  --   --   < > = values in this interval not  displayed. Cardiac Enzymes: No results for input(s): CKTOTAL, CKMB, CKMBINDEX, TROPONINI in the last 168 hours. BNP: Invalid input(s): POCBNP CBG: No results for input(s): GLUCAP in the last 168 hours. D-Dimer No results for input(s): DDIMER in the last 72 hours. Hgb A1c No results for input(s): HGBA1C in the last 72 hours. Lipid Profile No results for input(s): CHOL, HDL, LDLCALC, TRIG, CHOLHDL, LDLDIRECT in the last 72 hours. Thyroid function studies No results for input(s): TSH, T4TOTAL, T3FREE, THYROIDAB in the last 72 hours.  Invalid input(s): FREET3 Anemia work up No results for input(s): VITAMINB12, FOLATE, FERRITIN, TIBC, IRON, RETICCTPCT in the last 72 hours. Microbiology No results found for this or any previous visit (from the past 240 hour(s)).   Discharge Instructions:   Discharge Instructions    Diet general    Complete by:  As directed   Discharge instructions    Complete by:  As directed   Return to ER for further bleeding, if bleeding continues a left sided source needs to be treated through IR or surgically Can take OTC iron-- can ask pharmacist for their recommendations   Increase activity slowly    Complete by:  As directed       Medication List    STOP taking these medications   aspirin EC 81 MG tablet   AZOR 10-40 MG tablet Generic drug:  amLODipine-olmesartan     TAKE these medications   acetaminophen 500 MG tablet Commonly known as:  TYLENOL Take 500 mg by mouth every 6 (six) hours as needed for mild pain.   bimatoprost 0.03 % ophthalmic solution Commonly known as:  LUMIGAN Place 1 drop into both eyes at bedtime.   calcium-vitamin D 500-200 MG-UNIT tablet Commonly known as:  OSCAL WITH D Take 1 tablet by mouth daily.   omeprazole 20 MG capsule Commonly known as:  PRILOSEC Take 20 mg by mouth daily.   VITAMIN D3 PO Take 1 tablet by mouth daily.         Time coordinating discharge: 35 min  Signed:  Fredy Gladu U Ranulfo Kall   [facility] 10/02/2015, 10:28 AM

## 2015-10-03 LAB — TYPE AND SCREEN
ABO/RH(D): B POS
Antibody Screen: NEGATIVE
Unit division: 0

## 2015-10-09 DIAGNOSIS — K922 Gastrointestinal hemorrhage, unspecified: Secondary | ICD-10-CM | POA: Diagnosis not present

## 2015-10-09 DIAGNOSIS — D649 Anemia, unspecified: Secondary | ICD-10-CM | POA: Diagnosis not present

## 2015-10-09 DIAGNOSIS — Z09 Encounter for follow-up examination after completed treatment for conditions other than malignant neoplasm: Secondary | ICD-10-CM | POA: Diagnosis not present

## 2015-10-09 DIAGNOSIS — K5730 Diverticulosis of large intestine without perforation or abscess without bleeding: Secondary | ICD-10-CM | POA: Diagnosis not present

## 2015-10-09 DIAGNOSIS — N951 Menopausal and female climacteric states: Secondary | ICD-10-CM | POA: Diagnosis not present

## 2015-10-09 DIAGNOSIS — D62 Acute posthemorrhagic anemia: Secondary | ICD-10-CM | POA: Diagnosis not present

## 2015-10-09 DIAGNOSIS — K5900 Constipation, unspecified: Secondary | ICD-10-CM | POA: Diagnosis not present

## 2015-10-23 DIAGNOSIS — K5900 Constipation, unspecified: Secondary | ICD-10-CM | POA: Diagnosis not present

## 2015-10-23 DIAGNOSIS — D509 Iron deficiency anemia, unspecified: Secondary | ICD-10-CM | POA: Diagnosis not present

## 2015-10-23 DIAGNOSIS — K5730 Diverticulosis of large intestine without perforation or abscess without bleeding: Secondary | ICD-10-CM | POA: Diagnosis not present

## 2015-10-23 DIAGNOSIS — K219 Gastro-esophageal reflux disease without esophagitis: Secondary | ICD-10-CM | POA: Diagnosis not present

## 2016-01-06 DIAGNOSIS — H401122 Primary open-angle glaucoma, left eye, moderate stage: Secondary | ICD-10-CM | POA: Diagnosis not present

## 2016-01-06 DIAGNOSIS — H2513 Age-related nuclear cataract, bilateral: Secondary | ICD-10-CM | POA: Diagnosis not present

## 2016-01-06 DIAGNOSIS — H401112 Primary open-angle glaucoma, right eye, moderate stage: Secondary | ICD-10-CM | POA: Diagnosis not present

## 2016-01-06 DIAGNOSIS — H25013 Cortical age-related cataract, bilateral: Secondary | ICD-10-CM | POA: Diagnosis not present

## 2016-02-03 DIAGNOSIS — D509 Iron deficiency anemia, unspecified: Secondary | ICD-10-CM | POA: Diagnosis not present

## 2016-02-03 DIAGNOSIS — K5730 Diverticulosis of large intestine without perforation or abscess without bleeding: Secondary | ICD-10-CM | POA: Diagnosis not present

## 2016-02-03 DIAGNOSIS — K219 Gastro-esophageal reflux disease without esophagitis: Secondary | ICD-10-CM | POA: Diagnosis not present

## 2016-02-04 DIAGNOSIS — E782 Mixed hyperlipidemia: Secondary | ICD-10-CM | POA: Diagnosis not present

## 2016-02-04 DIAGNOSIS — I10 Essential (primary) hypertension: Secondary | ICD-10-CM | POA: Diagnosis not present

## 2016-04-05 IMAGING — CR DG CHEST 2V
2 series · 2 of 2 positions shown · non-contrast
Comparison: 06/30/2014

CLINICAL DATA: Chest pain and dyspnea, onset today. Hypertension
appear

EXAM:
CHEST  2 VIEW

[chest pa]
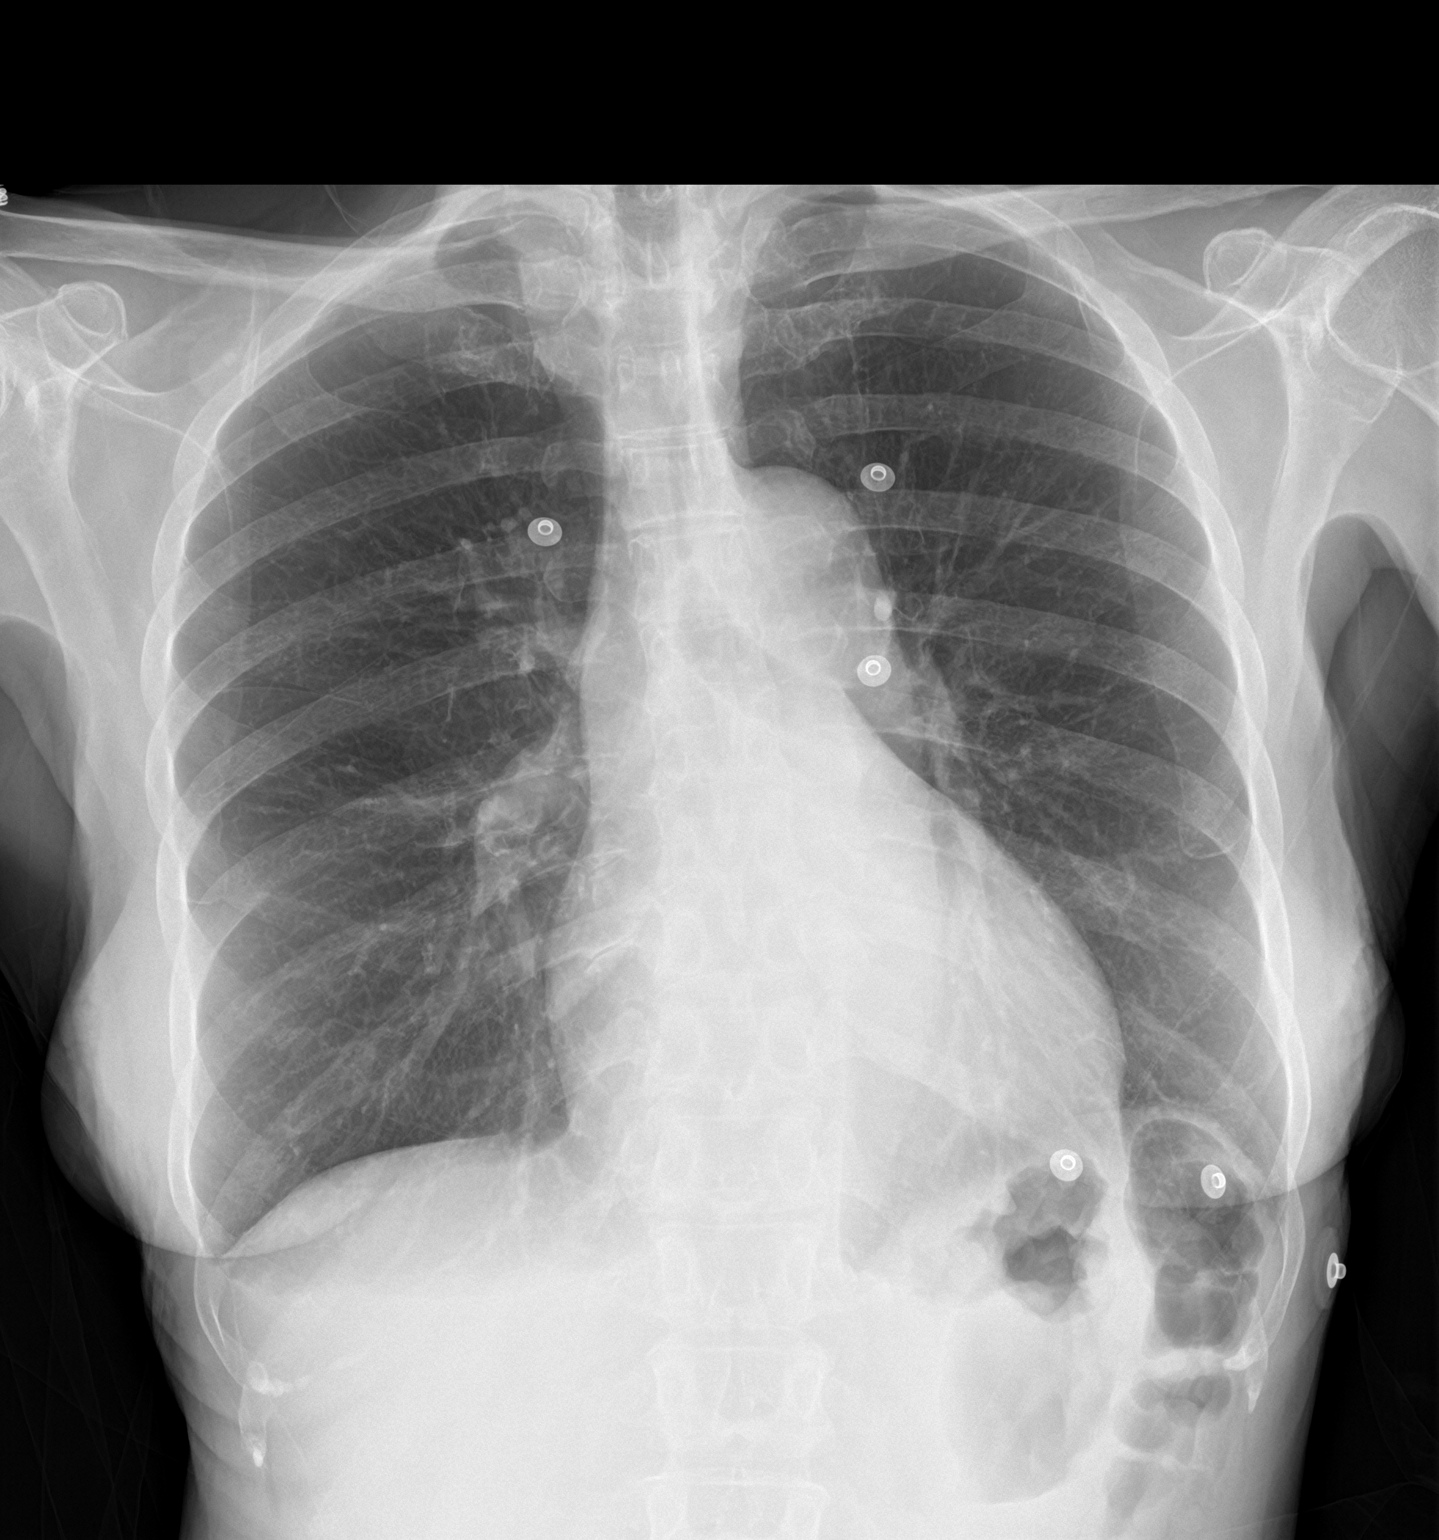

[chest lat]
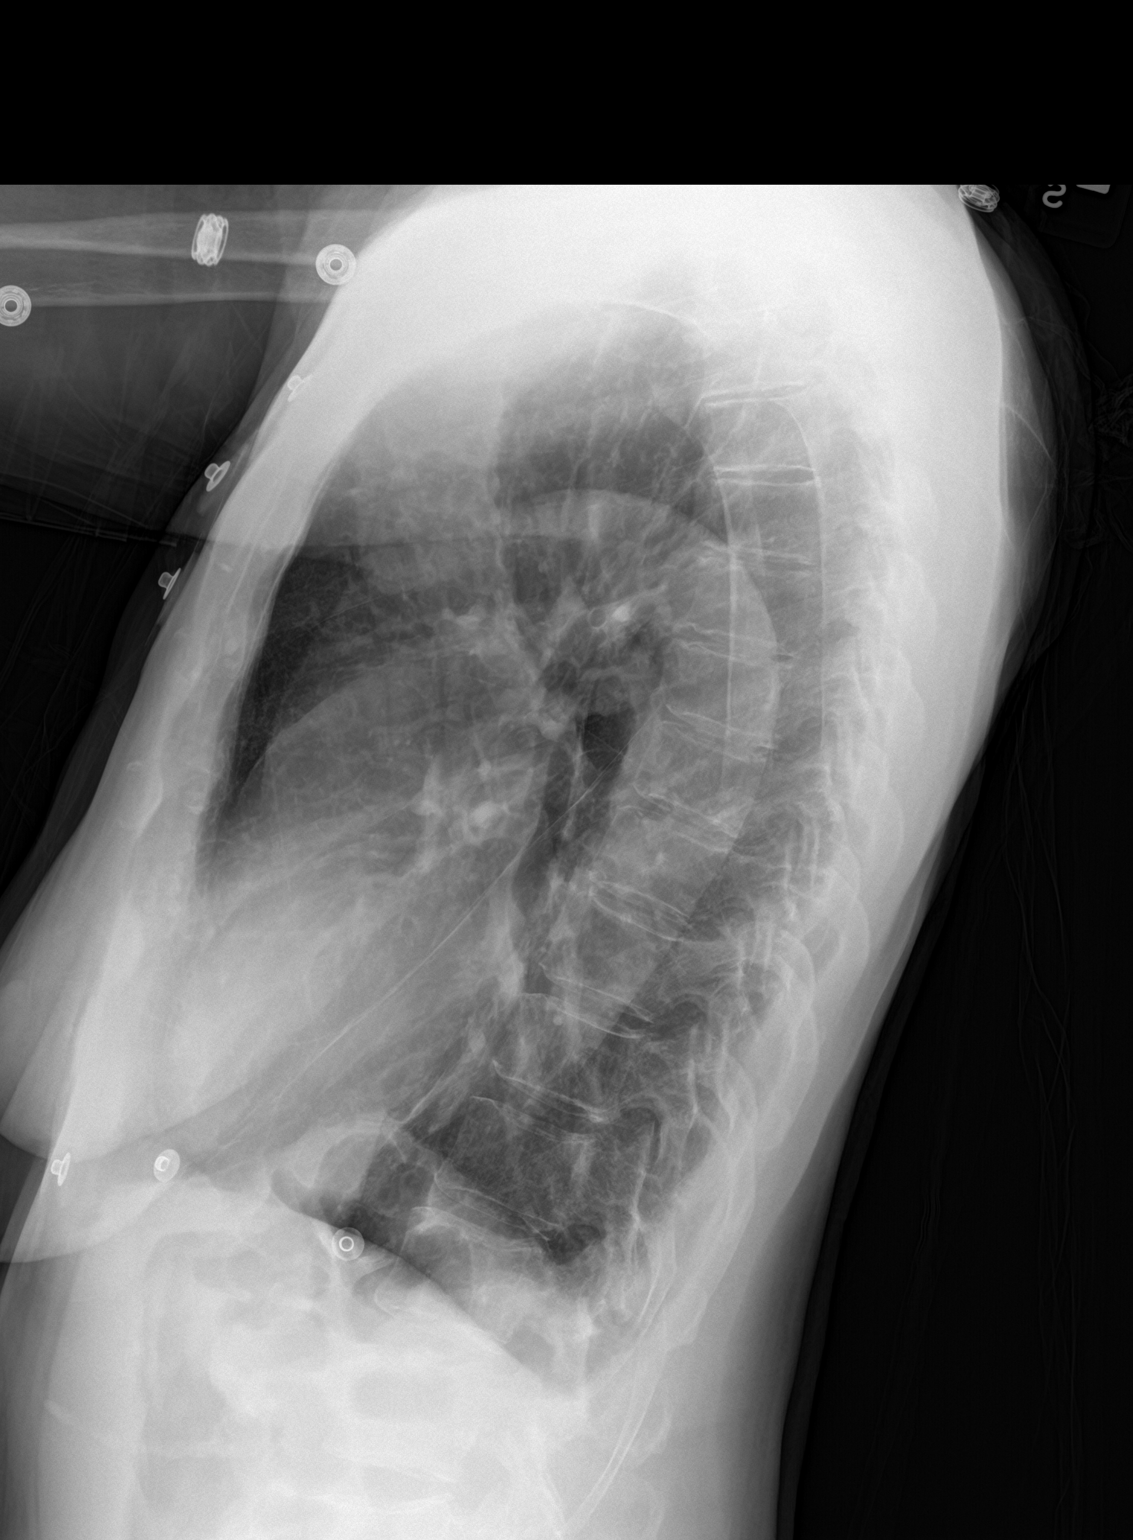

[2 of 2 positions shown; findings below may reference images not displayed]

FINDINGS: There is moderate unchanged hyperinflation. The lungs are clear. The
pulmonary vasculature is normal. There is no pleural effusion.
Hilar, mediastinal and cardiac contours are unremarkable and
unchanged.
IMPRESSION: Hyperinflation.  No acute findings.

## 2016-04-12 ENCOUNTER — Emergency Department (HOSPITAL_COMMUNITY)
Admission: EM | Admit: 2016-04-12 | Discharge: 2016-04-12 | Disposition: A | Discharge status: Home / Self Care | Payer: Medicare Other | Attending: Emergency Medicine | Admitting: Emergency Medicine

## 2016-04-12 ENCOUNTER — Emergency Department (HOSPITAL_COMMUNITY): Payer: Medicare Other

## 2016-04-12 ENCOUNTER — Encounter (HOSPITAL_COMMUNITY): Payer: Self-pay | Admitting: Emergency Medicine

## 2016-04-12 DIAGNOSIS — Z96641 Presence of right artificial hip joint: Secondary | ICD-10-CM | POA: Insufficient documentation

## 2016-04-12 DIAGNOSIS — R002 Palpitations: Secondary | ICD-10-CM | POA: Insufficient documentation

## 2016-04-12 DIAGNOSIS — I10 Essential (primary) hypertension: Secondary | ICD-10-CM | POA: Insufficient documentation

## 2016-04-12 LAB — CBC
HCT: 39.7 % (ref 36.0–46.0)
HEMOGLOBIN: 12.5 g/dL (ref 12.0–15.0)
MCH: 26.4 pg (ref 26.0–34.0)
MCHC: 31.5 g/dL (ref 30.0–36.0)
MCV: 83.9 fL (ref 78.0–100.0)
PLATELETS: 286 10*3/uL (ref 150–400)
RBC: 4.73 MIL/uL (ref 3.87–5.11)
RDW: 14 % (ref 11.5–15.5)
WBC: 5.2 10*3/uL (ref 4.0–10.5)

## 2016-04-12 LAB — BASIC METABOLIC PANEL
Anion gap: 10 (ref 5–15)
BUN: 13 mg/dL (ref 6–20)
CALCIUM: 9.3 mg/dL (ref 8.9–10.3)
CO2: 28 mmol/L (ref 22–32)
CREATININE: 0.91 mg/dL (ref 0.44–1.00)
Chloride: 104 mmol/L (ref 101–111)
GFR calc non Af Amer: 58 mL/min — ABNORMAL LOW (ref >60)
GLUCOSE: 102 mg/dL — AB (ref 65–99)
Potassium: 4.6 mmol/L (ref 3.5–5.1)
Sodium: 142 mmol/L (ref 135–145)

## 2016-04-12 LAB — I-STAT TROPONIN, ED: Troponin i, poc: 0 ng/mL (ref 0.00–0.08)

## 2016-04-12 NOTE — ED Provider Notes (Signed)
[facility]-EMERGENCY DEPT Provider Note   CSN: [id number] Arrival date & time: 04/12/16  [id number]     History   Chief Complaint Chief Complaint  Patient presents with  . Palpitations    HPI Tammy [name] is a 83 y.o. female.  HPI  Feeling of palpitations, fluttering associated with a sensation "like something is there", reports when asked like a pressure, in the center of the chest.  Has been coming and going over the last month. Wakes her up from sleep.  Happens only at night when going to sleep.  No shortness of breath, no nausea/vomiting.  Has had night sweats for a year or more.  Was on hormones and took her off due to her age, has been off for years.  When eats something late at night can cause sweating.  No hx of palpitations like this before.  No discomfort in chest right now. No exertional chest pain or shortness of breath.  Past Medical History:  Diagnosis Date  . Acid reflux   . Glaucoma   . Hypertension     Patient Active Problem List   Diagnosis Date Noted  . Diverticulosis of large intestine with hemorrhage   . Lower GI bleed   . Rectal bleeding 09/26/2015  . Essential hypertension 09/26/2015  . Acute blood loss anemia 09/26/2015  . Hypokalemia 09/26/2015  . HIP REPLACEMENT, RIGHT, HX OF 05/27/2008  . VITAMIN D DEFICIENCY 05/13/2008  . GERD 05/13/2008  . OSTEOARTHRITIS, HIPS, BILATERAL 05/13/2008    Past Surgical History:  Procedure Laterality Date  . ABDOMINAL HYSTERECTOMY    . COLONOSCOPY N/A 09/30/2015   Procedure: COLONOSCOPY;  Surgeon: Jeani HawkingPatrick Hung, MD;  Location: [facility] ENDOSCOPY;  Service: Endoscopy;  Laterality: N/A;  . JOINT REPLACEMENT      OB History    No data available       Home Medications    Prior to Admission medications   Medication Sig Start Date End Date Taking? Authorizing Provider  acetaminophen (TYLENOL) 500 MG tablet Take 500 mg by mouth every 6 (six) hours as needed for mild pain.    Historical Provider, MD  bimatoprost  (LUMIGAN) 0.03 % ophthalmic solution Place 1 drop into both eyes at bedtime.    Historical Provider, MD  calcium-vitamin D (OSCAL WITH D) 500-200 MG-UNIT per tablet Take 1 tablet by mouth daily.    Historical Provider, MD  Cholecalciferol (VITAMIN D3 PO) Take 1 tablet by mouth daily.    Historical Provider, MD  omeprazole (PRILOSEC) 20 MG capsule Take 20 mg by mouth daily.    Historical Provider, MD    Family History Family History  Problem Relation Age of Onset  . Hypertension Mother   . Hypertension Sister   . Hypertension Brother   . Heart disease Brother     Social History Social History  Substance Use Topics  . Smoking status: Never Smoker  . Smokeless tobacco: Never Used  . Alcohol use No     Allergies   Advil [ibuprofen]; Ciprofloxacin; and Zolpidem tartrate   Review of Systems Review of Systems  Constitutional: Negative for fever.  HENT: Negative for sore throat.   Eyes: Negative for visual disturbance.  Respiratory: Negative for cough and shortness of breath.   Cardiovascular: Positive for chest pain and palpitations. Negative for leg swelling.  Gastrointestinal: Negative for abdominal pain, nausea and vomiting.  Genitourinary: Positive for frequency (for about one month). Negative for difficulty urinating.  Musculoskeletal: Negative for back pain and neck pain.  Skin: Negative  for rash.  Neurological: Negative for syncope, light-headedness and headaches.     Physical Exam Updated Vital Signs BP (!) 154/71   Pulse (!) 54   Temp 98.6 F (37 C) (Oral)   Resp 12   SpO2 100%   Physical Exam  Constitutional: She is oriented to person, place, and time. She appears well-developed and well-nourished. No distress.  HENT:  Head: Normocephalic and atraumatic.  Eyes: Conjunctivae and EOM are normal.  Neck: Normal range of motion.  Cardiovascular: Regular rhythm, normal heart sounds and intact distal pulses.  Bradycardia present.  Exam reveals no gallop and no  friction rub.   No murmur heard. Pulmonary/Chest: Effort normal and breath sounds normal. No respiratory distress. She has no wheezes. She has no rales.  Abdominal: Soft. She exhibits no distension. There is no tenderness. There is no guarding.  Musculoskeletal: She exhibits no edema or tenderness.  Neurological: She is alert and oriented to person, place, and time.  Skin: Skin is warm and dry. No rash noted. She is not diaphoretic. No erythema.  Nursing note and vitals reviewed.    ED Treatments / Results  Labs (all labs ordered are listed, but only abnormal results are displayed) Labs Reviewed  BASIC METABOLIC PANEL - Abnormal; Notable for the following:       Result Value   Glucose, Bld 102 (*)    GFR calc non Af Amer 58 (*)    All other components within normal limits  CBC  I-STAT TROPOININ, ED    EKG  EKG Interpretation  Date/Time:  Sunday April 13 2015 10:41:28 EDT Ventricular Rate:  52 PR Interval:  152 QRS Duration: 100 QT Interval:  509 QTC Calculation: 473 R Axis:   -49 Text Interpretation:  Sinus rhythm Left anterior fascicular block Anterior infarct, old No significant change was found Confirmed by [facility] MD, Teagan Ozawa ([id number]) on 04/12/2016 9:33:56 AM       Radiology Dg Chest 2 View  Result Date: 04/12/2016 CLINICAL DATA:  Heart palpitations. EXAM: CHEST  2 VIEW COMPARISON:  04/03/2015 FINDINGS: There is hyperinflation of the lungs compatible with COPD. Scarring at the left base. Right lung is clear. Heart is borderline in size. No effusions or acute bony abnormality. IMPRESSION: COPD/chronic changes.  No active disease. Electronically Signed   By: Charlett Nose M.D.   On: 04/12/2016 09:09    Procedures Procedures (including critical care time)  Medications Ordered in ED Medications - No data to display   Initial Impression / Assessment and Plan / ED Course  I have reviewed the triage vital signs and the nursing notes.  Pertinent labs & imaging results  that were available during my care of the patient were reviewed by me and considered in my medical decision making (see chart for details).    83 year old female with a history of hypertension, diverticulitis versus a bleed, who presents with concern for palpitations with associated chest pressure about develops only at night when laying in bed.  The emergency department, patient has a normal sinus rhythm, without signs of arrhythmia on telemetry. Her EKG is without acute changes. Delta troponins done and negative. No dyspnea, no tachycardia, no hypoxia, and have low suspicion for pulmonary embolus. She has no other anginal symptoms, and atypical type chest pain with palpitations.  Patient noted to have a slow heart rate, and had similar on prior visit one year ago. (last visit had GI bleed and was more elevated)  patient is well-appearing, A. fib chronic at this  time.  Suspect given symptoms laying down at night, after eating, may be reflux, or PVCs that are noticed only at night, however given symptoms of palpitations and chest pressure, feel outpatient cardiology follow-up and Holter monitoring is indicated. Patient discharged in stable condition with understanding of reasons to return.    Final Clinical Impressions(s) / ED Diagnoses   Final diagnoses:  Palpitations    New Prescriptions New Prescriptions   No medications on file     Alvira Monday, MD 04/12/16 1055

## 2016-04-12 NOTE — ED Notes (Signed)
Patient transported to X-ray 

## 2016-04-12 NOTE — ED Triage Notes (Signed)
Pt sts intermittent palpitations with some SOB x several days worse at night; pt denies sx at present

## 2016-04-16 DIAGNOSIS — H1131 Conjunctival hemorrhage, right eye: Secondary | ICD-10-CM | POA: Diagnosis not present

## 2016-04-27 ENCOUNTER — Ambulatory Visit (INDEPENDENT_AMBULATORY_CARE_PROVIDER_SITE_OTHER): Payer: Medicare Other | Admitting: Cardiology

## 2016-04-27 ENCOUNTER — Encounter: Payer: Self-pay | Admitting: Cardiology

## 2016-04-27 VITALS — BP 118/54 | HR 58 | Ht 67.0 in | Wt 144.0 lb

## 2016-04-27 DIAGNOSIS — R002 Palpitations: Secondary | ICD-10-CM | POA: Diagnosis not present

## 2016-04-27 LAB — T3, FREE: T3, Free: 2.7 pg/mL (ref 2.0–4.4)

## 2016-04-27 LAB — TSH: TSH: 1.1 u[IU]/mL (ref 0.450–4.500)

## 2016-04-27 LAB — T4, FREE: Free T4: 1.35 ng/dL (ref 0.82–1.77)

## 2016-04-27 NOTE — Progress Notes (Signed)
04/27/2016 Tammy [name]   [birth year]  [id number]  Primary Physician Gwynneth Aliment, MD Primary Cardiologist: New (Case Discussed w/ Dr. Ladona Ridgel, DOD)  Reason for Visit/CC: New Patient Consultation for Palpitations  Requesting MD: Dr. Dorothyann Peng   HPI: Tammy [name] is a 83 y.o. female who is being seen today for the evaluation of palpitations at the request of Dorothyann Peng, MD.  Her PMH is significant for HTN and diverticulitis. She was seen in the [facility] ED on 04/12/16 for palpitations. In the ED, EKG showed NSR w/ possible LAE and LAFB. No arrhthymias were captured on telemetry during observation. Delta troponin's were negative. She denied dyspnea and there was no tachycardia, thus low suspicion for PE. Electrolytes were WNL. BMP and CBC unremarkable. Given unremarkable w/u in the ED, she was released and instructed to f/u in our office for further evaluation/ consideration for ambulatory monitoring.  She is currently asymptomatic.  She reports a several week h/o intermittent palpitations. Described as a fluttering in her chest. Occurs mostly at night but can also occur during the day. She denies associated CP, dyspnea, dizziness, syncope/ near syncope. However she does note heavy sweating at night. Symptoms do not occur every night, but several times a month. She denies any exertional CP or dyspnea.   BP is WNL at 118/54. Pulse rate is 58 bpm.    Current Meds  Medication Sig  . acetaminophen (TYLENOL) 500 MG tablet Take 500 mg by mouth every 6 (six) hours as needed for mild pain.  . Amlodipine-Olmesartan (AZOR PO) Take 1 tablet by mouth daily.  . bimatoprost (LUMIGAN) 0.03 % ophthalmic solution Place 1 drop into both eyes at bedtime.  . calcium-vitamin D (OSCAL WITH D) 500-200 MG-UNIT per tablet Take 1 tablet by mouth daily.  . Cholecalciferol (VITAMIN D3 PO) Take 1 tablet by mouth daily.  [name] omeprazole (PRILOSEC) 20 MG capsule Take 20 mg by mouth daily.   Allergies  Allergen  Reactions  . Advil [Ibuprofen] Itching  . Ciprofloxacin     REACTION: hives  . Zolpidem Tartrate     REACTION: trembling   Past Medical History:  Diagnosis Date  . Acid reflux   . Glaucoma   . Hypertension    Family History  Problem Relation Age of Onset  . Hypertension Mother   . Hypertension Sister   . Hypertension Brother   . Heart disease Brother    Past Surgical History:  Procedure Laterality Date  . ABDOMINAL HYSTERECTOMY    . COLONOSCOPY N/A 09/30/2015   Procedure: COLONOSCOPY;  Surgeon: Jeani Hawking, MD;  Location: [facility] ENDOSCOPY;  Service: Endoscopy;  Laterality: N/A;  . JOINT REPLACEMENT     Social History   Social History  . Marital status: Widowed    Spouse name: N/A  . Number of children: N/A  . Years of education: N/A   Occupational History  . Not on file.   Social History Main Topics  . Smoking status: Never Smoker  . Smokeless tobacco: Never Used  . Alcohol use No  . Drug use: No  . Sexual activity: Not on file   Other Topics Concern  . Not on file   Social History Narrative  . No narrative on file     Review of Systems: General: negative for chills, fever, night sweats or weight changes.  Cardiovascular: negative for chest pain, dyspnea on exertion, edema, orthopnea, palpitations, paroxysmal nocturnal dyspnea or shortness of breath Dermatological: negative for rash Respiratory: negative for cough  or wheezing Urologic: negative for hematuria Abdominal: negative for nausea, vomiting, diarrhea, bright red blood per rectum, melena, or hematemesis Neurologic: negative for visual changes, syncope, or dizziness All other systems reviewed and are otherwise negative except as noted above.   Physical Exam:  Blood pressure (!) 118/54, pulse (!) 58, height  (1.702 m), weight 144 lb (65.3 kg), SpO2 98 %.  General appearance: alert, cooperative and no distress Neck: no carotid bruit and no JVD Lungs: clear to auscultation bilaterally Heart:  regular rate and rhythm, S1, S2 normal, no murmur, click, rub or gallop Extremities: extremities normal, atraumatic, no cyanosis or edema Pulses: 2+ and symmetric Skin: Skin color, texture, turgor normal. No rashes or lesions Neurologic: Grossly normal  EKG NSR w/ possible LAE and LAFB  ASSESSMENT AND PLAN:   1. Palpitations: w/u in the ED was unremarkable, including telemetry, troponin x 2, CBC and BMP. EKG shows NSR w/ possible LAE and LAFB. She is currently asymptomatic. Physical exam is benign. I have discussed patient case with Dr. Ladona Ridgel, DOD. We have elected to further evaluate with a 30 day ambulatory monitor to r/o PAF/flutter and other worrisome arrhthymias. Given her excessive sweating, we will also check at [facility], Free T3/T4.    PLAN  f/u in 4-5 weeks, after monitor.   Robbie Lis PA-C 04/27/2016 8:42 AM

## 2016-04-27 NOTE — Patient Instructions (Addendum)
Medication Instructions:  Your physician recommends that you continue on your current medications as directed. Please refer to the Current Medication list given to you today.   Labwork: TODAY:  TSH  Testing/Procedures: Your physician has recommended that you wear an event monitor. Event monitors are medical devices that record the heart's electrical activity. Doctors most often [place] these monitors to diagnose arrhythmias. Arrhythmias are problems with the speed or rhythm of the heartbeat. The monitor is a small, portable device. You can wear one while you do your normal daily activities. This is usually used to diagnose what is causing palpitations/syncope (passing out).    Follow-Up: Your physician recommends that you schedule a follow-up appointment in: 1-2 WEEKS AFTER THE HEART MONITOR IS TURNED IN WITH BRITTANY SIMMONS  Any Other Special Instructions Will Be Listed Below (If Applicable).   Cardiac Event Monitoring A cardiac event monitor is a small recording device that is used to detect abnormal heart rhythms (arrhythmias). The monitor is used to record your heart rhythm when you have symptoms, such as:  Fast heartbeats (palpitations), such as heart racing or fluttering.  Dizziness.  Fainting or light-headedness.  Unexplained weakness. Some monitors are wired to electrodes placed on your chest. Electrodes are flat, sticky disks that attach to your skin. Other monitors may be hand-held or worn on the wrist. The monitor can be worn for up to 30 days. If the monitor is attached to your chest, a technician will prepare your chest for the electrode placement and show you how to work the monitor. Take time to practice using the monitor before you leave the office. Make sure you understand how to send the information from the monitor to your health care provider. In some cases, you may need to use a landline telephone instead of a cell phone. What are the risks? Generally, this device is  safe to use, but it possible that the skin under the electrodes will become irritated. How to use your cardiac event monitor  Wear your monitor at all times, except when you are in water:  Do not let the monitor get wet.  Take the monitor off when you bathe. Do not swim or use a hot tub with it on.  Keep your skin clean. Do not put body lotion or moisturizer on your chest.  Change the electrodes as told by your health care provider or any time they stop sticking to your skin. You may need to use medical tape to keep them on.  Try to put the electrodes in slightly different places on your chest to help prevent skin irritation. They must remain in the area under your left breast and in the upper right section of your chest.  Make sure the monitor is safely clipped to your clothing or in a location close to your body that your health care provider recommends.  Press the button to record as soon as you feel heart-related symptoms, such as:  Dizziness.  Weakness.  Light-headedness.  Palpitations.  Thumping or pounding in your chest.  Shortness of breath.  Unexplained weakness.  Keep a diary of your activities, such as walking, doing chores, and taking medicine. It is very important to note what you were doing when you pushed the button to record your symptoms. This will help your health care provider determine what might be contributing to your symptoms.  Send the recorded information as recommended by your health care provider. It may take some time for your health care provider to process the  results.  Change the batteries as told by your health care provider.  Keep electronic devices away from your monitor. This includes:  Tablets.  MP3 players.  Cell phones.  While wearing your monitor you should avoid:  Electric blankets.  Firefighter.  Electric toothbrushes.  Microwave ovens.  Magnets.  Metal detectors. Get help right away if:  You have chest  pain.  You have extreme difficulty breathing or shortness of breath.  You develop a very fast heartbeat that persists.  You develop dizziness that does not go away.  You faint or constantly feel like you are about to faint. Summary  A cardiac event monitor is a small recording device that is used to help detect abnormal heart rhythms (arrhythmias).  The monitor is used to record your heart rhythm when you have heart-related symptoms.  Make sure you understand how to send the information from the monitor to your health care provider.  It is important to press the button on the monitor when you have any heart-related symptoms.  Keep a diary of your activities, such as walking, doing chores, and taking medicine. It is very important to note what you were doing when you pushed the button to record your symptoms. This will help your health care provider learn what might be causing your symptoms. This information is not intended to replace advice given to you by your health care provider. Make sure you discuss any questions you have with your health care provider. Document Released: 10/21/2007 Document Revised: 12/27/2015 Document Reviewed: 12/27/2015 Elsevier Interactive Patient Education  2017 [employer].  If you need a refill on your cardiac medications before your next appointment, please call your pharmacy.

## 2016-04-30 ENCOUNTER — Ambulatory Visit (INDEPENDENT_AMBULATORY_CARE_PROVIDER_SITE_OTHER): Payer: Medicare Other

## 2016-04-30 DIAGNOSIS — R002 Palpitations: Secondary | ICD-10-CM

## 2016-05-10 ENCOUNTER — Telehealth: Payer: Self-pay | Admitting: *Deleted

## 2016-05-10 NOTE — Telephone Encounter (Signed)
Patient walked in today and I went to speak with her.   She reports that this morning when she was fixing her breakfast she felt a fluttering in her chest briefly and felt dizzy and a little bit nauseated.  This was all fleeting and it was the first time she felt these symptoms since wearing her event monitor.   She did not record symptoms at that time.  She denies any symptoms here today.   We reviewed how to record symptoms on her monitor.   Instructed to increase fluid intake.  She is sweating a lot at night.  Her thyroid labs were normal.  I advised that the monitor company will contact her if there is a critical event with her heart rhythm.  She is relieved to hear this.   She left today with no symptoms in no distress, walked to checkout in stable condition to verify her next appointment is.

## 2016-05-12 DIAGNOSIS — N951 Menopausal and female climacteric states: Secondary | ICD-10-CM | POA: Diagnosis not present

## 2016-05-12 DIAGNOSIS — R002 Palpitations: Secondary | ICD-10-CM | POA: Diagnosis not present

## 2016-05-12 DIAGNOSIS — I10 Essential (primary) hypertension: Secondary | ICD-10-CM | POA: Diagnosis not present

## 2016-05-18 ENCOUNTER — Encounter: Payer: Self-pay | Admitting: Cardiology

## 2016-05-19 DIAGNOSIS — S46811A Strain of other muscles, fascia and tendons at shoulder and upper arm level, right arm, initial encounter: Secondary | ICD-10-CM | POA: Diagnosis not present

## 2016-06-08 ENCOUNTER — Ambulatory Visit: Payer: Medicare Other | Admitting: Cardiology

## 2016-06-09 ENCOUNTER — Encounter: Payer: Self-pay | Admitting: Cardiology

## 2016-06-13 NOTE — Progress Notes (Signed)
Cardiology Office Note    Date:  06/15/2016   ID:  Tammy [name], DOB 07/16/1933, MRN [id number]  PCP:  Dorothyann PengSanders, Robyn, MD  Cardiologist:  New to Dr. Ladona Ridgelaylor  Chief Complaint: Event monitor result follow up   History of Present Illness:   Tammy [name] is a 83 y.o. female with hx of HTN, palpitations, GERd and diverticulitis present for follow up.   [facility] 04/12/16 for palpitations. In the ED, EKG showed NSR w/ possible LAE and LAFB. No arrhthymias were captured on telemetry during observation. Seen by Robbie LisBrittainy Simmons for initial consultation for intermittent palpitations. Described as a fluttering in her chest. Occurs mostly at night but can also occur during the day. Case discussed with DOD Dr. Ladona Ridgelaylor who recommended 30 days event monitor which showed NSR with PACs. No arrhthymias noted. TSH and FreeT4/T3 were normal.   Here today for follow up. Her palpitation has been improved significantly since dietary changes. It was associated with sugary intake of food. Intermittent does not associated shortness of breath or dizziness. She denies any syncope.   Past Medical History:  Diagnosis Date  . Acid reflux   . Glaucoma   . Hypertension     Past Surgical History:  Procedure Laterality Date  . ABDOMINAL HYSTERECTOMY    . COLONOSCOPY N/A 09/30/2015   Procedure: COLONOSCOPY;  Surgeon: Jeani HawkingPatrick Hung, MD;  Location: [facility] ENDOSCOPY;  Service: Endoscopy;  Laterality: N/A;  . JOINT REPLACEMENT      Current Medications: Prior to Admission medications   Medication Sig Start Date End Date Taking? Authorizing Provider  acetaminophen (TYLENOL) 500 MG tablet Take 500 mg by mouth every 6 (six) hours as needed for mild pain.    [provider]  Amlodipine-Olmesartan (AZOR PO) Take 1 tablet by mouth daily.    [provider]  bimatoprost (LUMIGAN) 0.03 % ophthalmic solution Place 1 drop into both eyes at bedtime.    [provider]  calcium-vitamin D  (OSCAL WITH D) 500-200 MG-UNIT per tablet Take 1 tablet by mouth daily.    [provider]  Cholecalciferol (VITAMIN D3 PO) Take 1 tablet by mouth daily.    [provider]  omeprazole (PRILOSEC) 20 MG capsule Take 20 mg by mouth daily.    [provider]    Allergies:   Advil [ibuprofen]; Ciprofloxacin; and Zolpidem tartrate   Social History   Social History  . Marital status: Widowed    Spouse name: N/A  . Number of children: N/A  . Years of education: N/A   Social History Main Topics  . Smoking status: Never Smoker  . Smokeless tobacco: Never Used  . Alcohol use No  . Drug use: No  . Sexual activity: Not Asked   Other Topics Concern  . None   Social History Narrative  . None     Family History:  The patient's family history includes Heart disease in her brother; Hypertension in her brother, mother, and sister.   ROS:   Please see the history of present illness.    ROS All other systems reviewed and are negative.   PHYSICAL EXAM:   VS:  BP (!) 128/56   Pulse 78   Ht 5\' 6"  (1.676 m)   Wt 145 lb 12.8 oz (66.1 kg)   BMI 23.53 kg/m    GEN: Well nourished, well developed, in no acute distress  HEENT: normal  Neck: no JVD, carotid bruits, or masses Cardiac: RRR; no  murmurs, rubs, or gallops,no edema  Respiratory:  clear to auscultation bilaterally, normal work of breathing GI: soft, nontender, nondistended, + BS MS: no deformity or atrophy  Skin: warm and dry, no rash Neuro:  Alert and Oriented x 3, Strength and sensation are intact Psych: euthymic mood, full affect  Wt Readings from Last 3 Encounters:  06/15/16 145 lb 12.8 oz (66.1 kg)  04/27/16 144 lb (65.3 kg)  09/26/15 145 lb 8.1 oz (66 kg)      Studies/Labs Reviewed:   EKG:  EKG is not ordered today.    Recent Labs: 09/30/2015: ALT 12; Magnesium 1.8 04/12/2016: BUN 13; Creatinine, Ser 0.91; Hemoglobin 12.5; Platelets 286; Potassium 4.6; Sodium 142 04/27/2016: TSH 1.100    Lipid Panel No results found for: CHOL, TRIG, HDL, CHOLHDL, VLDL, LDLCALC, LDLDIRECT  Additional studies/ records that were reviewed today include:   30 days event monitor NSR with rare PAC's 2. No sustained or non-sustained atrial or ventricular arrhythmias. 3. No prolonged pauses    ASSESSMENT & PLAN:    1. Palpitations  - Monitor shows PACs without arrhythmia. Her palpitation has been improved significantly with dietary changes. Asymptomatic. Avoid trigger. Follow-up when necessary. She is agree with plan.     Medication Adjustments/Labs and Tests Ordered: Current medicines are reviewed at length with the patient today.  Concerns regarding medicines are outlined above.  Medication changes, Labs and Tests ordered today are listed in the Patient Instructions below. Patient Instructions  Medication Instructions:  Your physician recommends that you continue on your current medications as directed. Please refer to the Current Medication list given to you today.   Labwork: None ordered  Testing/Procedures: None ordered  Follow-Up: Your physician recommends that you schedule a follow-up appointment in: AS NEEDED   Any Other Special Instructions Will Be Listed Below (If Applicable).     If you need a refill on your cardiac medications before your next appointment, please call your pharmacy.      Lorelei Pont, [place]  06/15/2016 2:56 PM    [facility] [address] [place], [place], [place]  [address] Phone: [phone]; Fax: [phone]

## 2016-06-15 ENCOUNTER — Encounter: Payer: Self-pay | Admitting: Physician Assistant

## 2016-06-15 ENCOUNTER — Ambulatory Visit (INDEPENDENT_AMBULATORY_CARE_PROVIDER_SITE_OTHER): Payer: Medicare Other | Admitting: Physician Assistant

## 2016-06-15 VITALS — BP 128/56 | HR 78 | Ht 66.0 in | Wt 145.8 lb

## 2016-06-15 DIAGNOSIS — R002 Palpitations: Secondary | ICD-10-CM

## 2016-06-15 NOTE — Patient Instructions (Signed)
Medication Instructions:  Your physician recommends that you continue on your current medications as directed. Please refer to the Current Medication list given to you today.   Labwork: None ordered  Testing/Procedures: None ordered  Follow-Up: Your physician recommends that you schedule a follow-up appointment in: AS NEEDED   Any Other Special Instructions Will Be Listed Below (If Applicable).     If you need a refill on your cardiac medications before your next appointment, please call your pharmacy.   

## 2016-06-16 DIAGNOSIS — S46811D Strain of other muscles, fascia and tendons at shoulder and upper arm level, right arm, subsequent encounter: Secondary | ICD-10-CM | POA: Diagnosis not present

## 2016-06-22 DIAGNOSIS — K5730 Diverticulosis of large intestine without perforation or abscess without bleeding: Secondary | ICD-10-CM | POA: Diagnosis not present

## 2016-06-22 DIAGNOSIS — K219 Gastro-esophageal reflux disease without esophagitis: Secondary | ICD-10-CM | POA: Diagnosis not present

## 2016-07-01 DIAGNOSIS — M25511 Pain in right shoulder: Secondary | ICD-10-CM | POA: Diagnosis not present

## 2016-07-05 DIAGNOSIS — Z1231 Encounter for screening mammogram for malignant neoplasm of breast: Secondary | ICD-10-CM | POA: Diagnosis not present

## 2016-07-08 DIAGNOSIS — M25511 Pain in right shoulder: Secondary | ICD-10-CM | POA: Diagnosis not present

## 2016-07-15 DIAGNOSIS — M25511 Pain in right shoulder: Secondary | ICD-10-CM | POA: Diagnosis not present

## 2016-07-22 DIAGNOSIS — M25511 Pain in right shoulder: Secondary | ICD-10-CM | POA: Diagnosis not present

## 2016-07-27 DIAGNOSIS — M25511 Pain in right shoulder: Secondary | ICD-10-CM | POA: Diagnosis not present

## 2016-08-11 DIAGNOSIS — H401132 Primary open-angle glaucoma, bilateral, moderate stage: Secondary | ICD-10-CM | POA: Diagnosis not present

## 2016-08-11 DIAGNOSIS — H25013 Cortical age-related cataract, bilateral: Secondary | ICD-10-CM | POA: Diagnosis not present

## 2016-08-11 DIAGNOSIS — H2513 Age-related nuclear cataract, bilateral: Secondary | ICD-10-CM | POA: Diagnosis not present

## 2016-08-12 DIAGNOSIS — M25511 Pain in right shoulder: Secondary | ICD-10-CM | POA: Diagnosis not present

## 2016-08-16 ENCOUNTER — Other Ambulatory Visit: Payer: Self-pay

## 2016-08-17 DIAGNOSIS — M25511 Pain in right shoulder: Secondary | ICD-10-CM | POA: Diagnosis not present

## 2016-08-18 DIAGNOSIS — E559 Vitamin D deficiency, unspecified: Secondary | ICD-10-CM | POA: Diagnosis not present

## 2016-08-18 DIAGNOSIS — K219 Gastro-esophageal reflux disease without esophagitis: Secondary | ICD-10-CM | POA: Diagnosis not present

## 2016-08-18 DIAGNOSIS — E2839 Other primary ovarian failure: Secondary | ICD-10-CM | POA: Diagnosis not present

## 2016-08-18 DIAGNOSIS — I10 Essential (primary) hypertension: Secondary | ICD-10-CM | POA: Diagnosis not present

## 2016-08-18 DIAGNOSIS — Z13820 Encounter for screening for osteoporosis: Secondary | ICD-10-CM | POA: Diagnosis not present

## 2016-08-18 DIAGNOSIS — R7309 Other abnormal glucose: Secondary | ICD-10-CM | POA: Diagnosis not present

## 2016-08-18 DIAGNOSIS — M25511 Pain in right shoulder: Secondary | ICD-10-CM | POA: Diagnosis not present

## 2016-08-18 DIAGNOSIS — Z0000 Encounter for general adult medical examination without abnormal findings: Secondary | ICD-10-CM | POA: Diagnosis not present

## 2016-08-24 ENCOUNTER — Other Ambulatory Visit: Payer: Self-pay | Admitting: Internal Medicine

## 2016-08-24 DIAGNOSIS — E2839 Other primary ovarian failure: Secondary | ICD-10-CM

## 2016-08-26 DIAGNOSIS — S46811D Strain of other muscles, fascia and tendons at shoulder and upper arm level, right arm, subsequent encounter: Secondary | ICD-10-CM | POA: Diagnosis not present

## 2016-08-30 ENCOUNTER — Other Ambulatory Visit: Payer: Medicare Other

## 2016-08-30 DIAGNOSIS — M810 Age-related osteoporosis without current pathological fracture: Secondary | ICD-10-CM | POA: Diagnosis not present

## 2016-08-30 DIAGNOSIS — M8588 Other specified disorders of bone density and structure, other site: Secondary | ICD-10-CM | POA: Diagnosis not present

## 2016-09-02 DIAGNOSIS — S46811D Strain of other muscles, fascia and tendons at shoulder and upper arm level, right arm, subsequent encounter: Secondary | ICD-10-CM | POA: Diagnosis not present

## 2016-09-15 DIAGNOSIS — G8929 Other chronic pain: Secondary | ICD-10-CM | POA: Diagnosis not present

## 2016-09-15 DIAGNOSIS — M25511 Pain in right shoulder: Secondary | ICD-10-CM | POA: Diagnosis not present

## 2016-10-04 ENCOUNTER — Other Ambulatory Visit: Payer: Medicare Other

## 2016-10-17 ENCOUNTER — Observation Stay (HOSPITAL_COMMUNITY)
Admission: EM | Admit: 2016-10-17 | Discharge: 2016-10-19 | Disposition: A | Discharge status: Home / Self Care | Payer: Medicare Other | Attending: Internal Medicine | Admitting: Internal Medicine

## 2016-10-17 ENCOUNTER — Encounter (HOSPITAL_COMMUNITY): Payer: Self-pay | Admitting: Emergency Medicine

## 2016-10-17 ENCOUNTER — Emergency Department (HOSPITAL_COMMUNITY): Payer: Medicare Other

## 2016-10-17 DIAGNOSIS — R079 Chest pain, unspecified: Secondary | ICD-10-CM | POA: Diagnosis not present

## 2016-10-17 DIAGNOSIS — I10 Essential (primary) hypertension: Secondary | ICD-10-CM | POA: Diagnosis not present

## 2016-10-17 DIAGNOSIS — R0789 Other chest pain: Secondary | ICD-10-CM | POA: Diagnosis not present

## 2016-10-17 DIAGNOSIS — K219 Gastro-esophageal reflux disease without esophagitis: Secondary | ICD-10-CM | POA: Diagnosis not present

## 2016-10-17 DIAGNOSIS — Z96641 Presence of right artificial hip joint: Secondary | ICD-10-CM | POA: Insufficient documentation

## 2016-10-17 DIAGNOSIS — D72829 Elevated white blood cell count, unspecified: Secondary | ICD-10-CM | POA: Diagnosis present

## 2016-10-17 DIAGNOSIS — Z79899 Other long term (current) drug therapy: Secondary | ICD-10-CM | POA: Insufficient documentation

## 2016-10-17 DIAGNOSIS — Z23 Encounter for immunization: Secondary | ICD-10-CM | POA: Insufficient documentation

## 2016-10-17 DIAGNOSIS — E559 Vitamin D deficiency, unspecified: Secondary | ICD-10-CM | POA: Insufficient documentation

## 2016-10-17 DIAGNOSIS — H409 Unspecified glaucoma: Secondary | ICD-10-CM | POA: Diagnosis not present

## 2016-10-17 DIAGNOSIS — D649 Anemia, unspecified: Secondary | ICD-10-CM | POA: Insufficient documentation

## 2016-10-17 DIAGNOSIS — R9431 Abnormal electrocardiogram [ECG] [EKG]: Secondary | ICD-10-CM | POA: Diagnosis not present

## 2016-10-17 DIAGNOSIS — G4489 Other headache syndrome: Secondary | ICD-10-CM | POA: Diagnosis not present

## 2016-10-17 DIAGNOSIS — D72828 Other elevated white blood cell count: Secondary | ICD-10-CM | POA: Diagnosis not present

## 2016-10-17 LAB — BASIC METABOLIC PANEL
Anion gap: 7 (ref 5–15)
BUN: 9 mg/dL (ref 6–20)
CHLORIDE: 105 mmol/L (ref 101–111)
CO2: 24 mmol/L (ref 22–32)
CREATININE: 0.79 mg/dL (ref 0.44–1.00)
Calcium: 9 mg/dL (ref 8.9–10.3)
GFR calc Af Amer: >60 mL/min (ref >60)
GFR calc non Af Amer: >60 mL/min (ref >60)
GLUCOSE: 123 mg/dL — AB (ref 65–99)
Potassium: 3.5 mmol/L (ref 3.5–5.1)
Sodium: 136 mmol/L (ref 135–145)

## 2016-10-17 LAB — CBC
HCT: 36.2 % (ref 36.0–46.0)
Hemoglobin: 11.6 g/dL — ABNORMAL LOW (ref 12.0–15.0)
MCH: 26.6 pg (ref 26.0–34.0)
MCHC: 32 g/dL (ref 30.0–36.0)
MCV: 83 fL (ref 78.0–100.0)
PLATELETS: 284 10*3/uL (ref 150–400)
RBC: 4.36 MIL/uL (ref 3.87–5.11)
RDW: 14.6 % (ref 11.5–15.5)
WBC: 18.5 10*3/uL — ABNORMAL HIGH (ref 4.0–10.5)

## 2016-10-17 LAB — URINALYSIS, ROUTINE W REFLEX MICROSCOPIC
BILIRUBIN URINE: NEGATIVE
Glucose, UA: NEGATIVE mg/dL
Hgb urine dipstick: NEGATIVE
KETONES UR: NEGATIVE mg/dL
Leukocytes, UA: NEGATIVE
NITRITE: NEGATIVE
PROTEIN: NEGATIVE mg/dL
SPECIFIC GRAVITY, URINE: 1.01 (ref 1.005–1.030)
pH: 7 (ref 5.0–8.0)

## 2016-10-17 LAB — TROPONIN I: Troponin I: <0.03 ng/mL (ref <0.03)

## 2016-10-17 LAB — I-STAT TROPONIN, ED: Troponin i, poc: 0.02 ng/mL (ref 0.00–0.08)

## 2016-10-17 MED ORDER — LATANOPROST 0.005 % OP SOLN
1.0000 [drp] | Freq: Every day | OPHTHALMIC | Status: DC
  Administered 2016-10-18: 1 [drp] via OPHTHALMIC
  Filled 2016-10-17: qty 2.5

## 2016-10-17 MED ORDER — ASPIRIN 81 MG PO CHEW
324.0000 mg | CHEWABLE_TABLET | Freq: Once | ORAL | Status: AC
  Administered 2016-10-17: 324 mg via ORAL
  Filled 2016-10-17: qty 4

## 2016-10-17 MED ORDER — PANTOPRAZOLE SODIUM 40 MG PO TBEC
40.0000 mg | DELAYED_RELEASE_TABLET | Freq: Every day | ORAL | Status: DC
  Administered 2016-10-18 – 2016-10-19 (×2): 40 mg via ORAL
  Filled 2016-10-17 (×2): qty 1

## 2016-10-17 MED ORDER — ONDANSETRON HCL 4 MG/2ML IJ SOLN: 4.0000 mg | Freq: Four times a day (QID) | INTRAMUSCULAR | Status: DC | PRN

## 2016-10-17 MED ORDER — ACETAMINOPHEN 325 MG PO TABS: 650.0000 mg | ORAL_TABLET | ORAL | Status: DC | PRN

## 2016-10-17 MED ORDER — ASPIRIN EC 325 MG PO TBEC
325.0000 mg | DELAYED_RELEASE_TABLET | Freq: Every day | ORAL | Status: DC
  Administered 2016-10-18 – 2016-10-19 (×2): 325 mg via ORAL
  Filled 2016-10-17 (×2): qty 1

## 2016-10-17 MED ORDER — ENOXAPARIN SODIUM 40 MG/0.4ML ~~LOC~~ SOLN
40.0000 mg | SUBCUTANEOUS | Status: DC
Start: 2016-10-17 — End: 2016-10-19
  Filled 2016-10-17: qty 0.4

## 2016-10-17 NOTE — ED Triage Notes (Signed)
Per ems, pt from home, c/o CP at 3am lasted about 2 hours, woke her up from bed. Pt denies any complaints at this time. Three nights ago she was again woken up by chest pain. Pt asymptomatic. [id number]

## 2016-10-17 NOTE — ED Provider Notes (Signed)
[facility]-EMERGENCY DEPT Provider Note   CSN: [id number] Arrival date & time: 10/17/16  1057     History   Chief Complaint Chief Complaint  Patient presents with  . Chest Pain    HPI Tammy [name] is a 83 y.o. female.  Patient with history of hypertension presents with complaint of chest pain which started acutely today at 3 AM and lasted for a couple of hours. Patient describes the pain as a pressure in her left chest with radiation to the right arm and shoulder. She had associated diaphoresis and an episode of vomiting. Symptoms resolve spontaneously. She reports going to EMS and they transported her to the hospital. No aspirin prior to arrival due to history of GI bleed. Symptoms are now resolved. Patient had an episode, again at night, several nights ago which resolved. No associated fevers, cough, abdominal pain, back pain, urinary symptoms. No lower extremity swelling. Patient denies any current blood in the stool. The onset of this condition was acute. The course is constant. Aggravating factors: none. Alleviating factors: none.        Past Medical History:  Diagnosis Date  . Acid reflux   . Glaucoma   . Hypertension     Patient Active Problem List   Diagnosis Date Noted  . Diverticulosis of large intestine with hemorrhage   . Lower GI bleed   . Rectal bleeding 09/26/2015  . Essential hypertension 09/26/2015  . Acute blood loss anemia 09/26/2015  . Hypokalemia 09/26/2015  . HIP REPLACEMENT, RIGHT, HX OF 05/27/2008  . VITAMIN D DEFICIENCY 05/13/2008  . GERD 05/13/2008  . OSTEOARTHRITIS, HIPS, BILATERAL 05/13/2008    Past Surgical History:  Procedure Laterality Date  . ABDOMINAL HYSTERECTOMY    . COLONOSCOPY N/A 09/30/2015   Procedure: COLONOSCOPY;  Surgeon: Jeani Hawking, MD;  Location: [facility];  Service: Endoscopy;  Laterality: N/A;  . JOINT REPLACEMENT      OB History    No data available       Home Medications    Prior to Admission medications     Medication Sig Start Date End Date Taking? Authorizing Provider  acetaminophen (TYLENOL) 500 MG tablet Take 500 mg by mouth every 6 (six) hours as needed for mild pain.    [provider]  Amlodipine-Olmesartan (AZOR PO) Take 1 tablet by mouth daily.    [provider]  bimatoprost (LUMIGAN) 0.03 % ophthalmic solution Place 1 drop into both eyes at bedtime.    [provider]  calcium-vitamin D (OSCAL WITH D) 500-200 MG-UNIT per tablet Take 1 tablet by mouth daily.    [provider]  Cholecalciferol (VITAMIN D3 PO) Take 1 tablet by mouth daily.    [provider]  omeprazole (PRILOSEC) 20 MG capsule Take 20 mg by mouth daily.    [provider]    Family History Family History  Problem Relation Age of Onset  . Hypertension Mother   . Hypertension Sister   . Hypertension Brother   . Heart disease Brother     Social History Social History  Substance Use Topics  . Smoking status: Never Smoker  . Smokeless tobacco: Never Used  . Alcohol use No     Allergies   Advil [ibuprofen]; Ciprofloxacin; and Zolpidem tartrate   Review of Systems Review of Systems  Constitutional: Positive for diaphoresis. Negative for fever.  Eyes: Negative for redness.  Respiratory: Negative for cough and shortness of breath.   Cardiovascular: Positive for chest pain. Negative for palpitations and  leg swelling.  Gastrointestinal: Positive for nausea and vomiting. Negative for abdominal pain.  Genitourinary: Negative for dysuria.  Musculoskeletal: Negative for back pain and neck pain.  Skin: Negative for rash.  Neurological: Negative for syncope and light-headedness.  Psychiatric/Behavioral: The patient is not nervous/anxious.      Physical Exam Updated Vital Signs BP (!) 141/73   Pulse 75   Temp 98.5 F (36.9 C) (Oral)   Resp 18   Ht  (1.702 m)   Wt 64 kg (141 lb)   SpO2 99%   BMI 22.08 kg/m   Physical Exam  Constitutional: She  appears well-developed and well-nourished.  HENT:  Head: Normocephalic and atraumatic.  Mouth/Throat: Oropharynx is clear and moist and mucous membranes are normal. Mucous membranes are not dry.  Eyes: Conjunctivae are normal.  Neck: Trachea normal and normal range of motion. Neck supple. Normal carotid pulses and no JVD present. No muscular tenderness present. Carotid bruit is not present. No tracheal deviation present.  Cardiovascular: Normal rate, regular rhythm, S1 normal, S2 normal, normal heart sounds and intact distal pulses.  Exam reveals no decreased pulses.   No murmur heard. Pulmonary/Chest: Effort normal. No respiratory distress. She has no wheezes. She exhibits no tenderness.  Abdominal: Soft. Normal aorta and bowel sounds are normal. There is tenderness (mild LLQ). There is no rebound and no guarding.  Musculoskeletal: Normal range of motion. She exhibits no tenderness.  Neurological: She is alert.  Skin: Skin is warm and dry. She is not diaphoretic. No cyanosis. No pallor.  Psychiatric: She has a normal mood and affect.  Nursing note and vitals reviewed.    ED Treatments / Results  Labs (all labs ordered are listed, but only abnormal results are displayed) Labs Reviewed  BASIC METABOLIC PANEL - Abnormal; Notable for the following:       Result Value   Glucose, Bld 123 (*)    All other components within normal limits  CBC - Abnormal; Notable for the following:    WBC 18.5 (*)    Hemoglobin 11.6 (*)    All other components within normal limits  URINALYSIS, ROUTINE W REFLEX MICROSCOPIC  I-STAT TROPONIN, ED    EKG  EKG Interpretation  Date/Time:  Sunday October 17 2016 11:03:19 EDT Ventricular Rate:  70 PR Interval:    QRS Duration: 101 QT Interval:  423 QTC Calculation: 457 R Axis:   -47 Text Interpretation:  Sinus rhythm Atrial premature complexes Left anterior fascicular block Abnormal R-wave progression, early transition LVH with secondary repolarization  abnormality Anterior Q waves, possibly due to LVH Confirmed by Margarita Grizzle [phone]) on 10/17/2016 12:42:02 PM       Radiology Dg Chest 2 View  Result Date: 10/17/2016 CLINICAL DATA:  Chest pain for several hours EXAM: CHEST  2 VIEW COMPARISON:  None. FINDINGS: Cardiac shadow is mildly enlarged. Aortic calcifications are seen. The lungs are well aerated bilaterally. No focal infiltrate or sizable effusion is seen. No bony abnormality is noted. IMPRESSION: No acute abnormality seen. Electronically Signed   By: Alcide Clever M.D.   On: 10/17/2016 11:36    Procedures Procedures (including critical care time)  Medications Ordered in ED Medications  aspirin chewable tablet 324 mg (324 mg Oral Given 10/17/16 1135)     Initial Impression / Assessment and Plan / ED Course  I have reviewed the triage vital signs and the nursing notes.  Pertinent labs & imaging results that were available during my care of the patient were reviewed  by me and considered in my medical decision making (see chart for details).     Patient seen and examined. Work-up initiated. Medications ordered.   Vital signs reviewed and are as follows: BP (!) 141/73   Pulse 75   Temp 98.5 F (36.9 C) (Oral)   Resp 18   Ht  (1.702 m)   Wt 64 kg (141 lb)   SpO2 99%   BMI 22.08 kg/m   1:03 PM Patient discussed with and seen with Dr. Rosalia Hammers. Will admit. Moderate HEART.   Spoke with Dr. Adrian Blackwater who will see.   Final Clinical Impressions(s) / ED Diagnoses   Final diagnoses:  Chest pain, unspecified type   Admit for CP.    New Prescriptions New Prescriptions   No medications on file     Renne Crigler, Cordelia Poche 10/17/16 1304    Margarita Grizzle, MD 10/18/16 1104

## 2016-10-17 NOTE — ED Notes (Signed)
Pt given chicken broth, [place] sandwich and ginger ale. Josh, PA approved of given pt meal.

## 2016-10-17 NOTE — H&P (Addendum)
History and Physical  Tammy [name] WUJ:[id number] DOB: 1933-09-25 DOA: 10/17/2016  Referring physician: Dr Rosalia Hammers, ED physician PCP: Dorothyann Peng, MD  Outpatient Specialists:   Patient Coming From: home  Chief Complaint: chest pain  HPI: Tammy [name] is a 83 y.o. female with a history of glaucoma, hypertension, acid reflux. He should seen for 2 episodes of chest pain. First episode was yesterday morning, which woke her out of sleep. Chest pain substernal no radiation into shoulder. Patient was diaphoretic and short of breath at the time. Chest pain resolved after 30-60 minutes. She had seconds episode this morning, which woke her out of sleep. She went over to her neighbor's house, who took her to the urgent care care. The urgent care center to the emergency department for evaluation. Second episode similar - substernal chest pain, severe, associated with nausea and vomiting and diaphoresis. Chest pain resolved after approximately 60 minutes. No other. Provoking factors. This feels different than her current symptoms. Never had it prior.  No family history of early cardiac disease. Has one cardiac risk factor with hypertension.  Emergency Department Course: Received aspirin. First troponin negative. Does have some EKG changes with new LVH and flattening T waves in the lateral leads.  Review of Systems:   Pt denies any fevers, chills, nausea, vomiting, diarrhea, constipation, abdominal pain, shortness of breath, dyspnea on exertion, orthopnea, cough, wheezing, palpitations, headache, vision changes, lightheadedness, dizziness, melena, rectal bleeding.  Review of systems are otherwise negative  Past Medical History:  Diagnosis Date  . Acid reflux   . Glaucoma   . Hypertension    Past Surgical History:  Procedure Laterality Date  . ABDOMINAL HYSTERECTOMY    . COLONOSCOPY N/A 09/30/2015   Procedure: COLONOSCOPY;  Surgeon: Jeani Hawking, MD;  Location: [facility];  Service: Endoscopy;   Laterality: N/A;  . JOINT REPLACEMENT     Social History:  reports that she has never smoked. She has never used smokeless tobacco. She reports that she does not drink alcohol or use drugs. Patient lives at Home  Allergies  Allergen Reactions  . Advil [Ibuprofen] Itching  . Ciprofloxacin Other (See Comments)    REACTION: hives  . Zolpidem Tartrate Other (See Comments)    REACTION: trembling  . Unisom [Doxylamine] Swelling and Rash    Family History  Problem Relation Age of Onset  . Hypertension Mother   . Hypertension Sister   . Hypertension Brother   . Heart disease Brother       Prior to Admission medications   Medication Sig Start Date End Date Taking? Authorizing Provider  acetaminophen (TYLENOL) 500 MG tablet Take 500 mg by mouth every 6 (six) hours as needed for mild pain.    [provider]  Amlodipine-Olmesartan (AZOR PO) Take 1 tablet by mouth daily.    [provider]  bimatoprost (LUMIGAN) 0.03 % ophthalmic solution Place 1 drop into both eyes at bedtime.    [provider]  calcium-vitamin D (OSCAL WITH D) 500-200 MG-UNIT per tablet Take 1 tablet by mouth daily.    [provider]  Cholecalciferol (VITAMIN D3 PO) Take 1 tablet by mouth daily.    [provider]  omeprazole (PRILOSEC) 20 MG capsule Take 20 mg by mouth daily.    [provider]    Physical Exam: BP (!) 145/73   Pulse 85   Temp 98.5 F (36.9 C) (Oral)   Resp 20   Ht  (1.702 m)   Wt 64 kg (  141 lb)   SpO2 98%   BMI 22.08 kg/m   General: Elderly black female.. Awake and alert and oriented x3. No acute cardiopulmonary distress.  HEENT: Normocephalic atraumatic.  Right and left ears normal in appearance.  Pupils equal, round, reactive to light. Extraocular muscles are intact. Sclerae anicteric and noninjected.  Moist mucosal membranes. No mucosal lesions.  Neck: Neck supple without lymphadenopathy. No carotid bruits. No masses palpated.    Cardiovascular: Regular rate with normal S1-S2 sounds. No murmurs, rubs, gallops auscultated. No JVD.  Respiratory: Good respiratory effort with no wheezes, rales, rhonchi. Lungs clear to auscultation bilaterally.  No accessory muscle use. Abdomen: Soft, nontender, nondistended. Active bowel sounds. No masses or hepatosplenomegaly  Skin: No rashes, lesions, or ulcerations.  Dry, warm to touch. 2+ dorsalis pedis and radial pulses. Musculoskeletal: No calf or leg pain. All major joints not erythematous nontender.  No upper or lower joint deformation.  Good ROM.  No contractures  Psychiatric: Intact judgment and insight. Pleasant and cooperative. Neurologic: No focal neurological deficits. Strength is 5/5 and symmetric in upper and lower extremities.  Cranial nerves II through XII are grossly intact.           Labs on Admission: I have personally reviewed following labs and imaging studies  CBC:  Recent Labs Lab 10/17/16 1110  WBC 18.5*  HGB 11.6*  HCT 36.2  MCV 83.0  PLT 284   Basic Metabolic Panel:  Recent Labs Lab 10/17/16 1110  NA 136  K 3.5  CL 105  CO2 24  GLUCOSE 123*  BUN 9  CREATININE 0.79  CALCIUM 9.0   GFR: Estimated Creatinine Clearance: 53.6 mL/min (by C-G formula based on SCr of 0.79 mg/dL). Liver Function Tests: No results for input(s): AST, ALT, ALKPHOS, BILITOT, PROT, ALBUMIN in the last 168 hours. No results for input(s): LIPASE, AMYLASE in the last 168 hours. No results for input(s): AMMONIA in the last 168 hours. Coagulation Profile: No results for input(s): INR, PROTIME in the last 168 hours. Cardiac Enzymes: No results for input(s): CKTOTAL, CKMB, CKMBINDEX, TROPONINI in the last 168 hours. BNP (last 3 results) No results for input(s): PROBNP in the last 8760 hours. HbA1C: No results for input(s): HGBA1C in the last 72 hours. CBG: No results for input(s): GLUCAP in the last 168 hours. Lipid Profile: No results for input(s): CHOL, HDL,  LDLCALC, TRIG, CHOLHDL, LDLDIRECT in the last 72 hours. Thyroid Function Tests: No results for input(s): TSH, T4TOTAL, FREET4, T3FREE, THYROIDAB in the last 72 hours. Anemia Panel: No results for input(s): VITAMINB12, FOLATE, FERRITIN, TIBC, IRON, RETICCTPCT in the last 72 hours. Urine analysis:    Component Value Date/Time   COLORURINE YELLOW 04/01/2011 0904   APPEARANCEUR CLOUDY (A) 04/01/2011 0904   LABSPEC 1.017 04/01/2011 0904   PHURINE 7.0 04/01/2011 0904   GLUCOSEU NEGATIVE 04/01/2011 0904   HGBUR NEGATIVE 04/01/2011 0904   BILIRUBINUR NEGATIVE 04/01/2011 0904   KETONESUR NEGATIVE 04/01/2011 0904   PROTEINUR NEGATIVE 04/01/2011 0904   UROBILINOGEN 0.2 04/01/2011 0904   NITRITE NEGATIVE 04/01/2011 0904   LEUKOCYTESUR NEGATIVE 04/01/2011 0904   Sepsis Labs: (procalcitonin:4,lacticidven:4) )No results found for this or any previous visit (from the past 240 hour(s)).   Radiological Exams on Admission: Dg Chest 2 View  Result Date: 10/17/2016 CLINICAL DATA:  Chest pain for several hours EXAM: CHEST  2 VIEW COMPARISON:  None. FINDINGS: Cardiac shadow is mildly enlarged. Aortic calcifications are seen. The lungs are well aerated bilaterally. No focal infiltrate or sizable  effusion is seen. No bony abnormality is noted. IMPRESSION: No acute abnormality seen. Electronically Signed   By: Alcide Clever M.D.   On: 10/17/2016 11:36    EKG: Independently reviewed. Sinus rhythm. LVH. Flattened T waves in V5 and V6. Left atrial enlargement.  Assessment/Plan: Active Problems:   GERD   Essential hypertension   Chest pain   Leukocytosis    This patient was discussed with the ED physician, including pertinent vitals, physical exam findings, labs, and imaging.  We also discussed care given by the ED provider.  #1 chest pain  Observation on telemetry   Serial troponins  EKG in the morning  Echo tomorrow #2 leukocytosis  No evidence of infection  Repeat CBC tomorrow  morning #3 hypertension  Patient uncertain of home dosage  Will await pharmacy's med rec #4 GERD  Continue home antacid treatment  DVT prophylaxis: Lovenox Consultants: None Code Status: Full code Family Communication: Nice and the room  Disposition Plan: Patient to return home tomorrow   Levie Heritage, DO [facility] Pager [phone]  If 7PM-7AM, please contact night-coverage [email or web address] Password [email or web address]

## 2016-10-17 NOTE — ED Provider Notes (Signed)
83 y.o female with ho hypertension presents today with chest pain that awoke her from sleep sscp with radiation to bilateral shoulder and diaphoresis.  EKG with nsst changes.  Initial troponin normal.  WBC elevated.  Patient pain free at present , aspirin given.  Plan observation.    Margarita Grizzle, MD 10/17/16 [phone]

## 2016-10-18 ENCOUNTER — Observation Stay (HOSPITAL_BASED_OUTPATIENT_CLINIC_OR_DEPARTMENT_OTHER): Payer: Medicare Other

## 2016-10-18 DIAGNOSIS — I10 Essential (primary) hypertension: Secondary | ICD-10-CM

## 2016-10-18 DIAGNOSIS — E559 Vitamin D deficiency, unspecified: Secondary | ICD-10-CM | POA: Diagnosis not present

## 2016-10-18 DIAGNOSIS — R9431 Abnormal electrocardiogram [ECG] [EKG]: Secondary | ICD-10-CM

## 2016-10-18 DIAGNOSIS — K219 Gastro-esophageal reflux disease without esophagitis: Secondary | ICD-10-CM | POA: Diagnosis not present

## 2016-10-18 DIAGNOSIS — D72828 Other elevated white blood cell count: Secondary | ICD-10-CM | POA: Diagnosis not present

## 2016-10-18 DIAGNOSIS — D72829 Elevated white blood cell count, unspecified: Secondary | ICD-10-CM | POA: Diagnosis not present

## 2016-10-18 DIAGNOSIS — Z23 Encounter for immunization: Secondary | ICD-10-CM | POA: Diagnosis not present

## 2016-10-18 DIAGNOSIS — H409 Unspecified glaucoma: Secondary | ICD-10-CM | POA: Diagnosis not present

## 2016-10-18 DIAGNOSIS — R079 Chest pain, unspecified: Principal | ICD-10-CM

## 2016-10-18 DIAGNOSIS — Z79899 Other long term (current) drug therapy: Secondary | ICD-10-CM | POA: Diagnosis not present

## 2016-10-18 DIAGNOSIS — Z96641 Presence of right artificial hip joint: Secondary | ICD-10-CM | POA: Diagnosis not present

## 2016-10-18 DIAGNOSIS — D649 Anemia, unspecified: Secondary | ICD-10-CM | POA: Diagnosis not present

## 2016-10-18 LAB — CBC
HEMATOCRIT: 34.9 % — AB (ref 36.0–46.0)
Hemoglobin: 10.7 g/dL — ABNORMAL LOW (ref 12.0–15.0)
MCH: 25.6 pg — ABNORMAL LOW (ref 26.0–34.0)
MCHC: 30.7 g/dL (ref 30.0–36.0)
MCV: 83.5 fL (ref 78.0–100.0)
PLATELETS: 295 10*3/uL (ref 150–400)
RBC: 4.18 MIL/uL (ref 3.87–5.11)
RDW: 14.8 % (ref 11.5–15.5)
WBC: 10.8 10*3/uL — ABNORMAL HIGH (ref 4.0–10.5)

## 2016-10-18 LAB — IRON AND TIBC
IRON: 15 ug/dL — AB (ref 28–170)
Saturation Ratios: 6 % — ABNORMAL LOW (ref 10.4–31.8)
TIBC: 267 ug/dL (ref 250–450)
UIBC: 252 ug/dL

## 2016-10-18 LAB — FERRITIN: FERRITIN: 55 ng/mL (ref 11–307)

## 2016-10-18 LAB — ECHOCARDIOGRAM COMPLETE
HEIGHTINCHES: 67 in
WEIGHTICAEL: 2299.2 [oz_av]

## 2016-10-18 MED ORDER — POLYSACCHARIDE IRON COMPLEX 150 MG PO CAPS
150.0000 mg | ORAL_CAPSULE | Freq: Every day | ORAL | Status: DC
  Administered 2016-10-18 – 2016-10-19 (×2): 150 mg via ORAL
  Filled 2016-10-18 (×2): qty 1

## 2016-10-18 MED ORDER — SIMETHICONE 80 MG PO CHEW
80.0000 mg | CHEWABLE_TABLET | Freq: Four times a day (QID) | ORAL | Status: DC | PRN
  Administered 2016-10-18: 80 mg via ORAL
  Filled 2016-10-18: qty 1

## 2016-10-18 NOTE — Progress Notes (Signed)
  Echocardiogram 2D Echocardiogram has been performed.  Tammy [name] 10/18/2016, 2:13 PM

## 2016-10-18 NOTE — Progress Notes (Signed)
PROGRESS NOTE    Tammy [name]  ZOX:[id number] DOB: [birth year] DOA: 10/17/2016 PCP: Dorothyann Peng, MD   Outpatient Specialists:     Brief Narrative:   Tammy [name] is a 83 y.o. female with a history of glaucoma, hypertension, acid reflux. He should seen for 2 episodes of chest pain. First episode was yesterday morning, which woke her out of sleep. Chest pain substernal no radiation into shoulder. Patient was diaphoretic and short of breath at the time. Chest pain resolved after 30-60 minutes. She had seconds episode this morning, which woke her out of sleep. She went over to her neighbor's house, who took her to the urgent care care. The urgent care center to the emergency department for evaluation. Second episode similar - substernal chest pain, severe, associated with nausea and vomiting and diaphoresis. Chest pain resolved after approximately 60 minutes. No other. Provoking factors. This feels different than her current symptoms. Never had it prior.  No family history of early cardiac disease. Has one cardiac risk factor with hypertension.  Assessment & Plan:   Active Problems:   GERD   Essential hypertension   Chest pain   Leukocytosis   chest pain  Ce negative  -echo relatively normal  leukocytosis  No evidence of infection  Repeat CBC tomorrow morning   hypertension  Patient uncertain of home dosage  Anemia -appears to be Fe def -patient had stopped on her own her prior supplements-- will resume -last heme test was +: known hemorrhoids  GERD  Continue home antacid treatment   DVT prophylaxis:  Lovenox   Code Status: Full Code   Family Communication:   Disposition Plan:     Consultants:        Subjective: Chest pain improved No cough, no fever  Objective: Vitals:   10/17/16 2032 10/18/16 0114 10/18/16 0617 10/18/16 0815  BP: (!) 121/59 116/71 125/71 130/62  Pulse: (!) 58 63 (!) 55 62  Resp: Temp: 98.1 F  (36.7 C) 98.2 F (36.8 C) 98.3 F (36.8 C) 97.8 F (36.6 C)  TempSrc: Oral Oral Oral Axillary  SpO2: 97% 98% 100% 98%  Weight:   65.2 kg (143 lb 11.2 oz)   Height:        Intake/Output Summary (Last 24 hours) at 10/18/16 1806 Last data filed at 10/18/16 1423  Gross per 24 hour  Intake              480 ml  Output              825 ml  Net             -345 ml   Filed Weights   10/17/16 1100 10/17/16 1612 10/18/16 0617  Weight: 64 kg (141 lb) 65.2 kg (143 lb 11.2 oz) 65.2 kg (143 lb 11.2 oz)    Examination:  General exam: Appears calm and comfortable  Respiratory system: Clear to auscultation. Respiratory effort normal. Cardiovascular system: S1 & S2 heard, RRR. No JVD, murmurs, rubs, gallops or clicks. No pedal edema. Gastrointestinal system: Abdomen is nondistended, soft and nontender. No organomegaly or masses felt. Normal bowel sounds heard. Central nervous system: Alert and oriented. No focal neurological deficits. Extremities: Symmetric 5 x 5 power. Skin: No rashes, lesions or ulcers Psychiatry: Judgement and insight appear normal. Mood & affect appropriate.     Data Reviewed: I have personally reviewed following labs and imaging studies  CBC:  Recent Labs Lab 10/17/16 1110 10/18/16 0403  WBC 18.5* 10.8*  HGB 11.6* 10.7*  HCT 36.2 34.9*  MCV 83.0 83.5  PLT 284 295   Basic Metabolic Panel:  Recent Labs Lab 10/17/16 1110  NA 136  K 3.5  CL 105  CO2 24  GLUCOSE 123*  BUN 9  CREATININE 0.79  CALCIUM 9.0   GFR: Estimated Creatinine Clearance: 53.6 mL/min (by C-G formula based on SCr of 0.79 mg/dL). Liver Function Tests: No results for input(s): AST, ALT, ALKPHOS, BILITOT, PROT, ALBUMIN in the last 168 hours. No results for input(s): LIPASE, AMYLASE in the last 168 hours. No results for input(s): AMMONIA in the last 168 hours. Coagulation Profile: No results for input(s): INR, PROTIME in the last 168 hours. Cardiac Enzymes:  Recent Labs Lab  10/17/16 1646 10/17/16 1917 10/17/16 2159  TROPONINI <0.03 <0.03 <0.03   BNP (last 3 results) No results for input(s): PROBNP in the last 8760 hours. HbA1C: No results for input(s): HGBA1C in the last 72 hours. CBG: No results for input(s): GLUCAP in the last 168 hours. Lipid Profile: No results for input(s): CHOL, HDL, LDLCALC, TRIG, CHOLHDL, LDLDIRECT in the last 72 hours. Thyroid Function Tests: No results for input(s): TSH, T4TOTAL, FREET4, T3FREE, THYROIDAB in the last 72 hours. Anemia Panel:  Recent Labs  10/18/16 1125  FERRITIN 55  TIBC 267  IRON 15*   Urine analysis:    Component Value Date/Time   COLORURINE YELLOW 10/17/2016 1302   APPEARANCEUR CLEAR 10/17/2016 1302   LABSPEC 1.010 10/17/2016 1302   PHURINE 7.0 10/17/2016 1302   GLUCOSEU NEGATIVE 10/17/2016 1302   HGBUR NEGATIVE 10/17/2016 1302   BILIRUBINUR NEGATIVE 10/17/2016 1302   KETONESUR NEGATIVE 10/17/2016 1302   PROTEINUR NEGATIVE 10/17/2016 1302   UROBILINOGEN 0.2 04/01/2011 0904   NITRITE NEGATIVE 10/17/2016 1302   LEUKOCYTESUR NEGATIVE 10/17/2016 1302     )No results found for this or any previous visit (from the past 240 hour(s)).    Anti-infectives    None       Radiology Studies: Dg Chest 2 View  Result Date: 10/17/2016 CLINICAL DATA:  Chest pain for several hours EXAM: CHEST  2 VIEW COMPARISON:  None. FINDINGS: Cardiac shadow is mildly enlarged. Aortic calcifications are seen. The lungs are well aerated bilaterally. No focal infiltrate or sizable effusion is seen. No bony abnormality is noted. IMPRESSION: No acute abnormality seen. Electronically Signed   By: Alcide Clever M.D.   On: 10/17/2016 11:36        Scheduled Meds: . aspirin EC  325 mg Oral Daily  . enoxaparin (LOVENOX) injection  40 mg Subcutaneous Q24H  . iron polysaccharides  150 mg Oral Daily  . latanoprost  1 drop Both Eyes QHS  . pantoprazole  40 mg Oral Daily   Continuous Infusions:   LOS: 0 days    Time  spent: 25 min    JESSICA U VANN, DO [facility] Pager [phone]  If 7PM-7AM, please contact night-coverage [email or web address] Password [facility] 10/18/2016, 6:06 PM

## 2016-10-18 NOTE — Care Management Obs Status (Signed)
MEDICARE OBSERVATION STATUS NOTIFICATION   Patient Details  Name: Tammy [name] MRN: [id number] Date of Birth: [birth year]   Medicare Observation Status Notification Given:  Yes    Elliot Cousin, RN 10/18/2016, 2:32 PM

## 2016-10-19 DIAGNOSIS — I10 Essential (primary) hypertension: Secondary | ICD-10-CM | POA: Diagnosis not present

## 2016-10-19 DIAGNOSIS — R079 Chest pain, unspecified: Secondary | ICD-10-CM | POA: Diagnosis not present

## 2016-10-19 DIAGNOSIS — D72828 Other elevated white blood cell count: Secondary | ICD-10-CM | POA: Diagnosis not present

## 2016-10-19 LAB — CBC
HEMATOCRIT: 35.7 % — AB (ref 36.0–46.0)
HEMOGLOBIN: 11.1 g/dL — AB (ref 12.0–15.0)
MCH: 25.6 pg — AB (ref 26.0–34.0)
MCHC: 31.1 g/dL (ref 30.0–36.0)
MCV: 82.4 fL (ref 78.0–100.0)
Platelets: 271 10*3/uL (ref 150–400)
RBC: 4.33 MIL/uL (ref 3.87–5.11)
RDW: 14.3 % (ref 11.5–15.5)
WBC: 6.7 10*3/uL (ref 4.0–10.5)

## 2016-10-19 MED ORDER — POLYSACCHARIDE IRON COMPLEX 150 MG PO CAPS: 150.0000 mg | ORAL_CAPSULE | Freq: Every day | ORAL | 0 refills | Status: DC

## 2016-10-19 MED ORDER — INFLUENZA VAC SPLIT HIGH-DOSE 0.5 ML IM SUSY
0.5000 mL | PREFILLED_SYRINGE | Freq: Once | INTRAMUSCULAR | Status: AC
  Administered 2016-10-19: 0.5 mL via INTRAMUSCULAR
  Filled 2016-10-19: qty 0.5

## 2016-10-19 MED ORDER — INFLUENZA VAC SPLIT HIGH-DOSE 0.5 ML IM SUSY: 0.5000 mL | PREFILLED_SYRINGE | INTRAMUSCULAR | Status: DC

## 2016-10-19 NOTE — Discharge Summary (Signed)
Physician Discharge Summary  Tammy [name]:[id number] DOB: 08-16-1933 DOA: 10/17/2016  PCP: Dorothyann Peng, MD  Admit date: 10/17/2016 Discharge date: 10/19/2016   Recommendations for Outpatient Follow-Up:   1. Follow Fe levels-- have resumed supplementation   Discharge Diagnosis:   Active Problems:   GERD   Essential hypertension   Chest pain   Leukocytosis   Discharge disposition:  Home  Discharge Condition: Improved.  Diet recommendation: Low sodium, heart healthy  Wound care: None.   History of Present Illness:   Tammy [name] is a 83 y.o. female with a history of glaucoma, hypertension, acid reflux. He should seen for 2 episodes of chest pain. First episode was yesterday morning, which woke her out of sleep. Chest pain substernal no radiation into shoulder. Patient was diaphoretic and short of breath at the time. Chest pain resolved after 30-60 minutes. She had seconds episode this morning, which woke her out of sleep. She went over to her neighbor's house, who took her to the urgent care care. The urgent care center to the emergency department for evaluation. Second episode similar - substernal chest pain, severe, associated with nausea and vomiting and diaphoresis. Chest pain resolved after approximately 60 minutes. No other. Provoking factors. This feels different than her current symptoms. Never had it prior.   Hospital Course by Problem:   chest pain  Ce negative  echo  normal  leukocytosis  No evidence of infection  Back to normal   hypertension  Resume home meds  Titrate as needed  Anemia -appears to be Fe def -patient had stopped on her own her prior supplements-- will resume- outpatient follow up -last heme test was +: known hemorrhoids  GERD  Continue home antacid treatment      Medical Consultants:    None.   Discharge Exam:   Vitals:   10/19/16 0026 10/19/16 0431  BP: (!) 167/86 (!) 152/82  Pulse: 79 74    Resp: 18 18  Temp: 98.6 F (37 C) 98.8 F (37.1 C)  SpO2: 100% 100%   Vitals:   10/18/16 0815 10/18/16 2014 10/19/16 0026 10/19/16 0431  BP: 130/62 138/66 (!) 167/86 (!) 152/82  Pulse: 62 64 79 74  Resp: Temp: 97.8 F (36.6 C) 98.7 F (37.1 C) 98.6 F (37 C) 98.8 F (37.1 C)  TempSrc: Axillary Oral Oral Oral  SpO2: 98% 100% 100% 100%  Weight:    65.1 kg (143 lb 8 oz)  Height:        Gen:  NAD   The results of significant diagnostics from this hospitalization (including imaging, microbiology, ancillary and laboratory) are listed below for reference.     Procedures and Diagnostic Studies:   Dg Chest 2 View  Result Date: 10/17/2016 CLINICAL DATA:  Chest pain for several hours EXAM: CHEST  2 VIEW COMPARISON:  None. FINDINGS: Cardiac shadow is mildly enlarged. Aortic calcifications are seen. The lungs are well aerated bilaterally. No focal infiltrate or sizable effusion is seen. No bony abnormality is noted. IMPRESSION: No acute abnormality seen. Electronically Signed   By: Alcide Clever M.D.   On: 10/17/2016 11:36     Labs:   Basic Metabolic Panel:  Recent Labs Lab 10/17/16 1110  NA 136  K 3.5  CL 105  CO2 24  GLUCOSE 123*  BUN 9  CREATININE 0.79  CALCIUM 9.0   GFR Estimated Creatinine Clearance: 53.6 mL/min (by C-G formula based on SCr of 0.79 mg/dL). Liver Function  Tests: No results for input(s): AST, ALT, ALKPHOS, BILITOT, PROT, ALBUMIN in the last 168 hours. No results for input(s): LIPASE, AMYLASE in the last 168 hours. No results for input(s): AMMONIA in the last 168 hours. Coagulation profile No results for input(s): INR, PROTIME in the last 168 hours.  CBC:  Recent Labs Lab 10/17/16 1110 10/18/16 0403 10/19/16 0500  WBC 18.5* 10.8* 6.7  HGB 11.6* 10.7* 11.1*  HCT 36.2 34.9* 35.7*  MCV 83.0 83.5 82.4  PLT 284 295 271   Cardiac Enzymes:  Recent Labs Lab 10/17/16 1646 10/17/16 1917 10/17/16 2159  TROPONINI <0.03 <0.03  <0.03   BNP: Invalid input(s): POCBNP CBG: No results for input(s): GLUCAP in the last 168 hours. D-Dimer No results for input(s): DDIMER in the last 72 hours. Hgb A1c No results for input(s): HGBA1C in the last 72 hours. Lipid Profile No results for input(s): CHOL, HDL, LDLCALC, TRIG, CHOLHDL, LDLDIRECT in the last 72 hours. Thyroid function studies No results for input(s): TSH, T4TOTAL, T3FREE, THYROIDAB in the last 72 hours.  Invalid input(s): FREET3 Anemia work up  Recent Labs  10/18/16 1125  FERRITIN 55  TIBC 267  IRON 15*   Microbiology No results found for this or any previous visit (from the past 240 hour(s)).   Discharge Instructions:   Discharge Instructions    Diet - low sodium heart healthy    Complete by:  As directed    Increase activity slowly    Complete by:  As directed      Allergies as of 10/19/2016      Reactions   Advil [ibuprofen] Itching   Ciprofloxacin Other (See Comments)   REACTION: hives   Zolpidem Tartrate Other (See Comments)   REACTION: trembling   Unisom [doxylamine] Swelling, Rash      Medication List    TAKE these medications   acetaminophen 500 MG tablet Commonly known as:  TYLENOL Take 500 mg by mouth every 6 (six) hours as needed for mild pain.   AZOR PO Take 1 tablet by mouth daily.   bimatoprost 0.03 % ophthalmic solution Commonly known as:  LUMIGAN Place 1 drop into both eyes at bedtime.   calcium-vitamin D 500-200 MG-UNIT tablet Commonly known as:  OSCAL WITH D Take 1 tablet by mouth daily.   iron polysaccharides 150 MG capsule Commonly known as:  NIFEREX Take 1 capsule (150 mg total) by mouth daily.   omeprazole 20 MG capsule Commonly known as:  PRILOSEC Take 20 mg by mouth daily.   VITAMIN D3 PO Take 1 tablet by mouth daily.            Discharge Care Instructions        Start     Ordered   10/19/16 0000  iron polysaccharides (NIFEREX) 150 MG capsule  Daily     10/19/16 0950   10/19/16  0000  Increase activity slowly     10/19/16 0950   10/19/16 0000  Diet - low sodium heart healthy     10/19/16 0950     Follow-up Information    Dorothyann Peng, MD Follow up in 1 week(s).   Specialty:  Internal Medicine Contact information: [address] [place] [address] [phone]            Time coordinating discharge: 35 min  Signed:  Clydie Dillen Juanetta Gosling   [facility] 10/19/2016, 9:50 AM

## 2016-10-19 NOTE — Progress Notes (Signed)
Patient stable, no complaints of chest pain. BP runs a little high.

## 2016-10-19 NOTE — Progress Notes (Signed)
Discharged for home.

## 2016-10-19 NOTE — Progress Notes (Signed)
Reviewed all discharge instructions, including paper prescription, with patient and she stated understanding.  Patient confirmed she has personal belongings of purse, clothing, glasses, and cell phone, with her to take home.  No voiced complaints.  She is awaiting ride home from friend.

## 2016-10-19 NOTE — Consult Note (Signed)
   [facility] CM Inpatient Consult   10/19/2016  Tammy [name] 1933/02/16 [id number]  Patient assessed for Medicare ACO for chest pain.  Met with the patient at the bedside to give her information on [place] and assess for needs.  Patient is independent and has no verbalized needs.  She confirms and endorses Dr. Glendale Chard as her primary care provider.  She has no current needs,  Patient accepted a brochure, 24 hour nurse advise line and information for General EMMI calls post hospital follow up.    For questions, please contact:  Natividad Brood, RN BSN [facility] Liaison  [phone] business mobile phone Toll free office [phone]

## 2016-10-25 ENCOUNTER — Other Ambulatory Visit: Payer: Medicare Other

## 2016-10-25 DIAGNOSIS — R079 Chest pain, unspecified: Secondary | ICD-10-CM | POA: Diagnosis not present

## 2016-10-25 DIAGNOSIS — K219 Gastro-esophageal reflux disease without esophagitis: Secondary | ICD-10-CM | POA: Diagnosis not present

## 2016-10-25 DIAGNOSIS — Z09 Encounter for follow-up examination after completed treatment for conditions other than malignant neoplasm: Secondary | ICD-10-CM | POA: Diagnosis not present

## 2016-10-25 DIAGNOSIS — E611 Iron deficiency: Secondary | ICD-10-CM | POA: Diagnosis not present

## 2016-10-27 ENCOUNTER — Other Ambulatory Visit: Payer: Self-pay | Admitting: *Deleted

## 2016-10-27 NOTE — Patient Outreach (Signed)
[facility]) Care Management  10/27/2016  Tammy [name] [birth year] [id number]   EMMI- General Discharge RED ON EMMI ALERT DAY#: 4 DATE: 10/23/16 RED ALERT: Lost interest in doing things? Yes   Outreach attempt to patient. HIPAA identifiers verified with patient. Patient stated, the EMMI automated questions recorded the wrong answer. Per patient, she enjoys doing things when she can. Patient received her hospital discharge paperwork and she understood it. Patient is taking her medications as prescribed, including Iron.She reported, she doesn't know why she stopped taking her Iron pills. She was given a prescription for Iron prior to discharging from the hospital, which she verbalized taking as prescribed. Patient reported, her hospital follow-up discharge appointment was 2 days ago. Her next scheduled appointment is scheduled for 3 months.   Plan: RN CM advised patient to contact RNCM for any needs or concerns. RN CM will notify [facility] CM administrative assistant regarding case closure.  RN CM will send EMMI educational materials to patient.   Tammy Cleveland, RN, BSN, MHA/MSL, [facility] Telephonic Care Manager Coordinator [facility] Direct Phone: [phone] Toll Free: [phone] Fax: [phone]

## 2016-10-28 ENCOUNTER — Encounter: Payer: Self-pay | Admitting: *Deleted

## 2016-11-15 DIAGNOSIS — H1131 Conjunctival hemorrhage, right eye: Secondary | ICD-10-CM | POA: Diagnosis not present

## 2017-02-16 DIAGNOSIS — I10 Essential (primary) hypertension: Secondary | ICD-10-CM | POA: Diagnosis not present

## 2017-02-16 DIAGNOSIS — R7309 Other abnormal glucose: Secondary | ICD-10-CM | POA: Diagnosis not present

## 2017-02-16 DIAGNOSIS — E782 Mixed hyperlipidemia: Secondary | ICD-10-CM | POA: Diagnosis not present

## 2017-02-16 DIAGNOSIS — E611 Iron deficiency: Secondary | ICD-10-CM | POA: Diagnosis not present

## 2017-02-16 DIAGNOSIS — K219 Gastro-esophageal reflux disease without esophagitis: Secondary | ICD-10-CM | POA: Diagnosis not present

## 2017-06-14 DIAGNOSIS — H2513 Age-related nuclear cataract, bilateral: Secondary | ICD-10-CM | POA: Diagnosis not present

## 2017-06-14 DIAGNOSIS — H26103 Unspecified traumatic cataract, bilateral: Secondary | ICD-10-CM | POA: Diagnosis not present

## 2017-06-14 DIAGNOSIS — H401211 Low-tension glaucoma, right eye, mild stage: Secondary | ICD-10-CM | POA: Diagnosis not present

## 2017-08-24 DIAGNOSIS — R413 Other amnesia: Secondary | ICD-10-CM | POA: Diagnosis not present

## 2017-08-24 DIAGNOSIS — R5383 Other fatigue: Secondary | ICD-10-CM | POA: Diagnosis not present

## 2017-08-24 DIAGNOSIS — R8290 Unspecified abnormal findings in urine: Secondary | ICD-10-CM | POA: Diagnosis not present

## 2017-08-24 DIAGNOSIS — E782 Mixed hyperlipidemia: Secondary | ICD-10-CM | POA: Diagnosis not present

## 2017-08-24 DIAGNOSIS — I10 Essential (primary) hypertension: Secondary | ICD-10-CM | POA: Diagnosis not present

## 2017-08-24 DIAGNOSIS — E559 Vitamin D deficiency, unspecified: Secondary | ICD-10-CM | POA: Diagnosis not present

## 2017-08-24 DIAGNOSIS — E611 Iron deficiency: Secondary | ICD-10-CM | POA: Diagnosis not present

## 2017-08-24 DIAGNOSIS — N951 Menopausal and female climacteric states: Secondary | ICD-10-CM | POA: Diagnosis not present

## 2017-08-24 DIAGNOSIS — E311 Polyglandular hyperfunction: Secondary | ICD-10-CM | POA: Diagnosis not present

## 2017-08-24 LAB — BASIC METABOLIC PANEL
BUN: 16 (ref 4–21)
CREATININE: 0.8 (ref 0.5–1.1)
Glucose: 98
Potassium: 5.1 (ref 3.4–5.3)
Sodium: 144 (ref 137–147)

## 2017-08-24 LAB — CBC AND DIFFERENTIAL
HCT: 44 (ref 36–46)
HEMOGLOBIN: 13.8 (ref 12.0–16.0)
Neutrophils Absolute: 4
PLATELETS: 275 (ref 150–399)
WBC: 5.6

## 2017-08-24 LAB — IRON,TIBC AND FERRITIN PANEL
%SAT: 18
FERRITIN: 46
Iron: 51
TIBC: 289
UIBC: 238

## 2017-08-24 LAB — LIPID PANEL
Cholesterol: 218 — AB (ref 0–200)
HDL: 89 — AB (ref 35–70)
LDL CALC: 120
LDl/HDL Ratio: 1.3
Triglycerides: 47 (ref 40–160)

## 2017-08-24 LAB — HEMOGLOBIN A1C: Hemoglobin A1C: 5.6

## 2017-08-24 LAB — TSH: TSH: 1.15 (ref 0.41–5.90)

## 2017-08-24 LAB — VITAMIN D 25 HYDROXY (VIT D DEFICIENCY, FRACTURES): Vit D, 25-Hydroxy: 33.2

## 2017-08-24 LAB — HEPATIC FUNCTION PANEL
ALK PHOS: 108 (ref 25–125)
ALT: 15 (ref 7–35)
AST: 21 (ref 13–35)
BILIRUBIN, TOTAL: 0.3

## 2017-08-24 LAB — VITAMIN B12: Vitamin B-12: 734

## 2017-11-23 ENCOUNTER — Encounter: Payer: Self-pay | Admitting: Nurse Practitioner

## 2017-11-23 DIAGNOSIS — Z9181 History of falling: Secondary | ICD-10-CM | POA: Insufficient documentation

## 2017-11-23 DIAGNOSIS — R5383 Other fatigue: Secondary | ICD-10-CM | POA: Insufficient documentation

## 2017-11-23 DIAGNOSIS — E782 Mixed hyperlipidemia: Secondary | ICD-10-CM | POA: Insufficient documentation

## 2017-11-23 DIAGNOSIS — R413 Other amnesia: Secondary | ICD-10-CM

## 2017-11-23 DIAGNOSIS — E611 Iron deficiency: Secondary | ICD-10-CM | POA: Insufficient documentation

## 2017-11-24 ENCOUNTER — Ambulatory Visit: Payer: Self-pay | Admitting: Nurse Practitioner

## 2017-12-01 ENCOUNTER — Other Ambulatory Visit: Payer: Self-pay | Admitting: Nurse Practitioner

## 2017-12-01 ENCOUNTER — Ambulatory Visit (INDEPENDENT_AMBULATORY_CARE_PROVIDER_SITE_OTHER): Payer: Medicare Other | Admitting: Nurse Practitioner

## 2017-12-01 VITALS — BP 150/82 | HR 70 | Temp 98.1°F | Ht 66.0 in | Wt 149.2 lb

## 2017-12-01 DIAGNOSIS — R413 Other amnesia: Secondary | ICD-10-CM

## 2017-12-01 DIAGNOSIS — I10 Essential (primary) hypertension: Secondary | ICD-10-CM | POA: Diagnosis not present

## 2017-12-01 MED ORDER — DONEPEZIL HCL 5 MG PO TABS: 5.0000 mg | ORAL_TABLET | Freq: Every day | ORAL | 2 refills | Status: DC

## 2017-12-01 MED ORDER — AMLODIPINE-OLMESARTAN 10-20 MG PO TABS: 1.0000 | ORAL_TABLET | Freq: Every day | ORAL | 1 refills | Status: DC

## 2017-12-01 MED ORDER — OMEPRAZOLE 20 MG PO CPDR: 20.0000 mg | DELAYED_RELEASE_CAPSULE | Freq: Every day | ORAL | 1 refills | Status: DC

## 2017-12-01 NOTE — Progress Notes (Signed)
Subjective:     Patient ID: Tammy [name] , female    DOB: [birth year] , 84 y.o.   MRN: [id number]   Chief Complaint  Patient presents with  . Hypertension    HPI  Memory problems - she is here today with her niece and sister.  They tell me she stays with her mother and sister either at her home or her mother's home since about one week after being seen in July.  The family feel her memory is about the same.  She has been driving occasionally from her house to her mothers which is about an hour away.  Her niece voices concerns on her driving alone.   Hypertension  This is a chronic problem. The current episode started more than 1 year ago. The problem has been gradually worsening since onset. The problem is uncontrolled (she has been without her blood pressure medication for approximately. ). Pertinent negatives include no anxiety, headaches or shortness of breath. There are no known risk factors for coronary artery disease. Past treatments include alpha 1 blockers. There are no compliance problems.  There is no history of angina.     Past Medical History:  Diagnosis Date  . Acid reflux   . Glaucoma   . Hypertension      Family History  Problem Relation Age of Onset  . Hypertension Mother   . Hypertension Sister   . Hypertension Brother   . Heart disease Brother      Current Outpatient Medications:  .  acetaminophen (TYLENOL) 500 MG tablet, Take 500 mg by mouth every 6 (six) hours as needed for mild pain., Disp: , Rfl:  .  Amlodipine-Olmesartan (AZOR PO), Take 1 tablet by mouth daily., Disp: , Rfl:  .  bimatoprost (LUMIGAN) 0.03 % ophthalmic solution, Place 1 drop into both eyes at bedtime., Disp: , Rfl:  .  calcium-vitamin D (OSCAL WITH D) 500-200 MG-UNIT per tablet, Take 1 tablet by mouth daily., Disp: , Rfl:  .  Cholecalciferol (VITAMIN D3 PO), Take 1 tablet by mouth daily., Disp: , Rfl:  .  omeprazole (PRILOSEC) 20 MG capsule, Take 20 mg by mouth daily., Disp: , Rfl:     Allergies  Allergen Reactions  . Advil [Ibuprofen] Itching  . Ciprofloxacin Other (See Comments)    REACTION: hives  . Zolpidem Tartrate Other (See Comments)    REACTION: trembling  . Unisom [Doxylamine] Swelling and Rash     Review of Systems  HENT: Negative.   Respiratory: Negative.  Negative for shortness of breath.   Cardiovascular: Negative.   Neurological: Negative.  Negative for dizziness and headaches.     Today's Vitals   12/01/17 1111  BP: (!) 150/82  Pulse: 70  Temp: 98.1 F (36.7 C)  TempSrc: Oral  SpO2: 95%  Weight: 149 lb 3.2 oz (67.7 kg)  Height: 5\' 6"  (1.676 m)  PainSc: 0-No pain   Body mass index is 24.08 kg/m.   Objective:  Physical Exam  Constitutional: She is oriented to person, place, and time. She appears well-developed and well-nourished.  Cardiovascular: Normal rate.  Pulmonary/Chest: Effort normal and breath sounds normal.  Neurological: She is alert and oriented to person, place, and time.  Skin: Skin is warm and dry. Capillary refill takes less than 2 seconds.        Assessment And Plan:     1. Essential hypertension  Chronic, poorly controlled currently due to being without her medication  Continue with current medications, sent refill for  90 day supply and one refill - amlodipine-olmesartan (AZOR) 10-20 MG tablet; Take 1 tablet by mouth daily.  Dispense: 90 tablet; Refill: 1 - omeprazole (PRILOSEC) 20 MG capsule; Take 1 capsule (20 mg total) by mouth daily.  Dispense: 90 capsule; Refill: 1  2. Memory loss  Memory score at last visit was 14 in 2017 was 26  Will start her on 5 mg of donepezil, discussed side effects to include hypotension and nausea.  Advised to take at night.  Will follow up in 3 months   I have discussed with her to drive with others in the car with her due to increasing construction that can affect her memory.  Will consider sending for a driving exam if her symptoms worsen.  - donepezil (ARICEPT) 5 MG  tablet; Take 1 tablet (5 mg total) by mouth at bedtime.  Dispense: 30 tablet; Refill: 2       Arnette Felts, FNP

## 2018-03-01 ENCOUNTER — Ambulatory Visit: Payer: Medicare Other | Admitting: Nurse Practitioner

## 2018-03-02 ENCOUNTER — Ambulatory Visit: Payer: Medicare Other | Admitting: Nurse Practitioner

## 2018-03-02 ENCOUNTER — Ambulatory Visit: Payer: Medicare Other | Admitting: Internal Medicine

## 2018-04-04 DIAGNOSIS — H2513 Age-related nuclear cataract, bilateral: Secondary | ICD-10-CM | POA: Diagnosis not present

## 2018-04-04 DIAGNOSIS — H5203 Hypermetropia, bilateral: Secondary | ICD-10-CM | POA: Diagnosis not present

## 2018-04-04 DIAGNOSIS — H401231 Low-tension glaucoma, bilateral, mild stage: Secondary | ICD-10-CM | POA: Diagnosis not present

## 2018-04-05 ENCOUNTER — Ambulatory Visit (INDEPENDENT_AMBULATORY_CARE_PROVIDER_SITE_OTHER): Payer: Medicare Other | Admitting: Nurse Practitioner

## 2018-04-05 ENCOUNTER — Other Ambulatory Visit: Payer: Self-pay

## 2018-04-05 ENCOUNTER — Encounter: Payer: Self-pay | Admitting: Nurse Practitioner

## 2018-04-05 VITALS — BP 140/84 | HR 58 | Ht 66.0 in | Wt 154.0 lb

## 2018-04-05 DIAGNOSIS — R413 Other amnesia: Secondary | ICD-10-CM

## 2018-04-05 DIAGNOSIS — I10 Essential (primary) hypertension: Secondary | ICD-10-CM | POA: Diagnosis not present

## 2018-04-05 DIAGNOSIS — E7800 Pure hypercholesterolemia, unspecified: Secondary | ICD-10-CM | POA: Diagnosis not present

## 2018-04-05 NOTE — Progress Notes (Signed)
Subjective:     Patient ID: Tammy [name] , female    DOB: July 05, 1933 , 85 y.o.   MRN: [id number]   Chief Complaint  Patient presents with  . med check    having issure  with new meds that she was give the last she seen , have alot dreams , more memory loss , no bathing , forgetting to take a meds ,    HPI   She feels after she starting [place] has been having more vivid dreams.  She does not feel her memory is worse.  She is still staying with her sister and mother.  She has not been at her house in the last 6 months.  Her mother is 22 years old.      Past Medical History:  Diagnosis Date  . Acid reflux   . Glaucoma   . Hypertension      Family History  Problem Relation Age of Onset  . Hypertension Mother   . Hypertension Sister   . Hypertension Brother   . Heart disease Brother      Current Outpatient Medications:  .  acetaminophen (TYLENOL) 500 MG tablet, Take 500 mg by mouth every 6 (six) hours as needed for mild pain., Disp: , Rfl:  .  amlodipine-olmesartan (AZOR) 10-20 MG tablet, Take 1 tablet by mouth daily., Disp: 90 tablet, Rfl: 1 .  bimatoprost (LUMIGAN) 0.03 % ophthalmic solution, Place 1 drop into both eyes at bedtime., Disp: , Rfl:  .  calcium-vitamin D (OSCAL WITH D) 500-200 MG-UNIT per tablet, Take 1 tablet by mouth daily., Disp: , Rfl:  .  donepezil (ARICEPT) 5 MG tablet, Take 1 tablet (5 mg total) by mouth at bedtime., Disp: 30 tablet, Rfl: 2 .  omeprazole (PRILOSEC) 20 MG capsule, Take 1 capsule (20 mg total) by mouth daily., Disp: 90 capsule, Rfl: 1 .  Amlodipine-Olmesartan (AZOR PO), Take 1 tablet by mouth daily., Disp: , Rfl:    Allergies  Allergen Reactions  . Advil [Ibuprofen] Itching  . Ciprofloxacin Other (See Comments)    REACTION: hives  . Zolpidem Tartrate Other (See Comments)    REACTION: trembling  . Unisom [Doxylamine] Swelling and Rash     Review of Systems  Constitutional: Negative for chills.  Respiratory: Negative for cough and  shortness of breath.   Cardiovascular: Negative for chest pain, palpitations and leg swelling.  Endocrine: Negative for polydipsia, polyphagia and polyuria.     Today's Vitals   04/05/18 1022  BP: 140/84  Pulse: (!) 58  SpO2: 95%  Weight: 154 lb (69.9 kg)  Height: '5\' 6"'  (1.676 m)   Body mass index is 24.86 kg/m.   Objective:  Physical Exam Vitals signs reviewed.  Constitutional:      Appearance: Normal appearance.  Cardiovascular:     Rate and Rhythm: Normal rate and regular rhythm.     Pulses: Normal pulses.  Neurological:     General: No focal deficit present.     Mental Status: She is alert and oriented to person, place, and time.     Cranial Nerves: No cranial nerve deficit.  Psychiatric:        Mood and Affect: Mood normal.         Assessment And Plan:   1. Memory loss  Continues to have challenges with her memory  Will refer to CCM for education on disease process   Also offered to have a nurse or to see if setting her up with a pharmacy  that will deliver medications already prepared to help.  Tolerating [place] however having vivid dreams but are not troublesome - Referral to Chronic Care Management Services - BMP8+eGFR  2. Elevated cholesterol  Chronic, fair control  Continue with current medications - Lipid panel - BMP8+eGFR  3. Essential hypertension . B/P is poorly controlled yet good for her, encouraged to make sure she is taking her medications regularly  . The importance of regular exercise and dietary modification was stressed to the patient.  . Stressed importance of losing ten percent of her body weight to help with B/P control.  . The weight loss would help with decreasing cardiac and cancer risk as well.       [name], FNP

## 2018-04-06 LAB — LIPID PANEL
CHOLESTEROL TOTAL: 183 mg/dL (ref 100–199)
Chol/HDL Ratio: 2.9 ratio (ref 0.0–4.4)
HDL: 63 mg/dL (ref >39)
LDL Calculated: 109 mg/dL — ABNORMAL HIGH (ref 0–99)
Triglycerides: 56 mg/dL (ref 0–149)
VLDL CHOLESTEROL CAL: 11 mg/dL (ref 5–40)

## 2018-04-06 LAB — BMP8+EGFR
BUN/Creatinine Ratio: 17 (ref 12–28)
BUN: 14 mg/dL (ref 8–27)
CALCIUM: 9.5 mg/dL (ref 8.7–10.3)
CO2: 24 mmol/L (ref 20–29)
CREATININE: 0.84 mg/dL (ref 0.57–1.00)
Chloride: 105 mmol/L (ref 96–106)
GFR calc Af Amer: 75 mL/min/{1.73_m2} (ref >59)
GFR, EST NON AFRICAN AMERICAN: 65 mL/min/{1.73_m2} (ref >59)
Glucose: 84 mg/dL (ref 65–99)
Potassium: 4.5 mmol/L (ref 3.5–5.2)
SODIUM: 146 mmol/L — AB (ref 134–144)

## 2018-04-07 ENCOUNTER — Ambulatory Visit: Payer: Self-pay

## 2018-04-07 DIAGNOSIS — R413 Other amnesia: Secondary | ICD-10-CM

## 2018-04-07 DIAGNOSIS — I10 Essential (primary) hypertension: Secondary | ICD-10-CM

## 2018-04-07 DIAGNOSIS — E782 Mixed hyperlipidemia: Secondary | ICD-10-CM

## 2018-04-07 NOTE — Patient Instructions (Signed)
Social Worker Visit Information  Goals we discussed today:  Goals Addressed            This Visit's Progress     Patient Stated   . "I want to go back home" (pt-stated)       Current Barriers:  . Limited social support . Level of care concerns . ADL IADL limitations . Limited access to caregiver . Memory Deficits  Clinical Social Work Clinical Goal(s):  [name] Over the next 30 days, patient will work with SW to address needs related to iADL limitation concerns when the patient returns home . Over the next 45 days, client will follow up with [facility] as directed by SW  Interventions: . SW interviewed the patients niece and POA  . Educated the patients niece on concerns with returning home due to the patients reported cognition . Identified tools to help the patient be successful if she does in fact return home including mobile meals and obtaining a caregiver via aide and attendance . Provided information about Aide and Attendance benefits provided under the patients military benefit. . Informed the patients niece of the process to obtain aide and attendance. The patient is in agreement with SW assisting with application process . Identified documents the family would need to gather to submit along with application . Educated the patients niece on the mobile meals program. The niece understands there is a waiting list and is agreeable to SW placing referral. . Placed mobile meal referral via [place] . Discussed plans for ongoing care management follow up and provided patient with direct contact information for care management team . [facility] to obtain appropriate forms to assist the patient with application  Patient Self Care Activities:  . Currently UNABLE TO independently care for self due to decressed skills with executive function including but not limited to managing money and decision making.  . The patient is UNABLE TO independently drive, perform  iADL's without prompting, or administer medications  Initial goal documentation         Materials provided: Verbal education about VA benefits provided by phone  Ms. Gibeault was given information about Chronic Care Management services today including:  1. CCM service includes personalized support from designated clinical staff supervised by her physician, including individualized plan of care and coordination with other care providers 2. 24/7 contact phone numbers for assistance for urgent and routine care needs. 3. Service will only be billed when office clinical staff spend 20 minutes or more in a month to coordinate care. 4. Only one practitioner may furnish and bill the service in a calendar month. 5. The patient may stop CCM services at any time (effective at the end of the month) by phone call to the office staff. 6. The patient will be responsible for cost sharing (co-pay) of up to 20% of the service fee (after annual deductible is met).  Patient agreed to services and verbal consent obtained.   The patient verbalized understanding of instructions provided today and declined a print copy of patient instruction materials.   Follow up plan: SW will follow up with patient by phone over the next two weeks   Daneen Schick, BSW, CDP [facility] / [place] Management Social Worker [phone]

## 2018-04-07 NOTE — Chronic Care Management (AMB) (Signed)
Care Management Note   Tammy [name] is a 85 y.o. year old female who is a primary care patient of Tammy [name], Tammy [name] . Tammy [name] asked the CM team to consult with the patient for assistance with chronic disease management and care coordination.  Review of patient status, including review of consultants reports, rand collaboration with appropriate care team members and the patient's provider was performed as part of comprehensive patient evaluation and provision of chronic care management services. Telephone outreach to patient today to introduce CCM services.   I reached out to [place] niece, Tammy [name] the patients POA by phone today.   Ms. Tammy [name] was given information about Chronic Care Management services today including:  1. CCM service includes personalized support from designated clinical staff supervised by her physician, including individualized plan of care and coordination with other care providers 2. 24/7 contact phone numbers for assistance for urgent and routine care needs. 3. Service will only be billed when office clinical staff spend 20 minutes or more in a month to coordinate care. 4. Only one practitioner may furnish and bill the service in a calendar month. 5. The patient may stop CCM services at any time (effective at the end of the month) by phone call to the office staff. 6. The patient will be responsible for cost sharing (co-pay) of up to 20% of the service fee (after annual deductible is met).    Goals Addressed            This Visit's Progress     Patient Stated   . "I want to go back home" (pt-stated)       Current Barriers:  . Limited social support . Level of care concerns . ADL IADL limitations . Limited access to caregiver . Memory Deficits  Clinical Social Work Clinical Goal(s):  Tammy [name] Kitchen Over the next 30 days, patient will work with SW to address needs related to iADL limitation concerns when the patient returns home . Over the  next 45 days, client will follow up with [facility] as directed by SW  Interventions: . SW interviewed the patients niece and POA  . Educated the patients niece on concerns with returning home due to the patients reported cognition . Identified tools to help the patient be successful if she does in fact return home including mobile meals and obtaining a caregiver via aide and attendance . Provided information about Aide and Attendance benefits provided under the patients military benefit. . Informed the patients niece of the process to obtain aide and attendance. The patient is in agreement with SW assisting with application process . Identified documents the family would need to gather to submit along with application . Educated the patients niece on the mobile meals program. The niece understands there is a waiting list and is agreeable to SW placing referral. . Placed mobile meal referral via Tammy [name] . Discussed plans for ongoing care management follow up and provided patient with direct contact information for care management team . Tammy [name] to obtain appropriate forms to assist the patient with application  Patient Self Care Activities:  . Currently UNABLE TO independently care for self due to decressed skills with executive function including but not limited to managing money and decision making.  . The patient is UNABLE TO independently drive, perform iADL's without prompting, or administer medications  Initial goal documentation          Patient agreed to services and verbal consent obtained.  Follow Up Plan: SW will follow up with patient by phone over the next two weeks to continue assisting with aide and attendance application.    Tammy [name], BSW, CDP [facility] Care Management Social Worker [phone]  Total time spent performing care coordination and/or care management activities with the patient by phone or face to face = 58 minutes.

## 2018-04-12 ENCOUNTER — Ambulatory Visit: Payer: Self-pay

## 2018-04-12 DIAGNOSIS — R413 Other amnesia: Secondary | ICD-10-CM

## 2018-04-12 DIAGNOSIS — I10 Essential (primary) hypertension: Secondary | ICD-10-CM

## 2018-04-12 DIAGNOSIS — E782 Mixed hyperlipidemia: Secondary | ICD-10-CM

## 2018-04-12 NOTE — Chronic Care Management (AMB) (Signed)
  Chronic Care Management   Note  04/12/2018 Name: AVANELL CAJINA MRN: [id number] DOB: November 24, 1933  Ms. ARYAHNA PASSI is an 85 year old female primary care patient of Arnette Felts FNP who was referred to the CCM team was consulted for assistance with chronic disease management and care coordination needs. I collaborated with [name] BSW today to establish a plan of care for Ms. Esty.   Review of patient status, including review of consultants reports, relevant laboratory and other test results, and collaboration with appropriate care team members and the patient's provider was performed as part of comprehensive patient evaluation and provision of chronic care management services.     Chronic Care Management   Note  04/12/2018 Name: SUSA BARIBAULT MRN: [id number] DOB: 1933-02-21  Ms. Lille HAIZLEE DAUPHINEE is a 85 year old female primary care patient of Arnette Felts FNP. The CCM team was consulted for assistance with chronic disease management and care coordination needs.   One on one collaboration with [name] BSW to establish a plan of care to support the care management needs of ISREAL RUNQUIST.   Review of patient status, including review of consultants reports, relevant laboratory and other test results, and collaboration with appropriate care team members and the patient's provider was performed as part of comprehensive patient evaluation and provision of chronic care management services.    Goals Addressed    . Establishment of Goals related to level of care needs       Current Barriers:  [name] Knowledge Barriers related to resources and support available to address needs related to level of care needs.  Case Manager Clinical Goal(s):  [name] Over the next 30 days, patient will work with BSW to address needs related to level of care concerns  Interventions:  . Collaborated with BSW and initiated plan of care to address needs related to level of care concerns  Patient Self Care  Activities:  . Unable to independently perform adls/iadls  Initial goal documentation      Follow up plan: The CM team will reach out to the patient again over the next 14 days.   Marja Kays MHA,BSN,RN,CCM Nurse Care Coordinator [facility] / [facility] Care Management [phone]

## 2018-04-13 NOTE — Patient Instructions (Signed)
Social Worker Visit Information  Goals we discussed today:  Goals Addressed            This Visit's Progress     Patient Stated   . "I want to go back home" (pt-stated)       Current Barriers:  . Limited social support . Level of care concerns . ADL IADL limitations . Limited access to caregiver . Memory Deficits  Clinical Social Work Clinical Goal(s):  [name] Over the next 30 days, patient will work with SW to address needs related to iADL limitation concerns when the patient returns home . Over the next 45 days, client will follow up with [facility] as directed by SW  Interventions: . SW educated the patients POA on aide and attendance application process . SW completed an aide and attendance application via phone with the assistance of patients POA . SW collaborated with the patients primary care provider to complete the medical portion of application . SW discussed plan moving forward with the patients POA in relation to application submission and waiting period  Patient Self Care Activities:  . Currently UNABLE TO independently care for self due to decressed skills with executive function including but not limited to managing money and decision making.  . The patient is UNABLE TO independently drive, perform iADL's without prompting, or administer medications  Please see past updates related to this goal by clicking on the "Past Updates" button in the selected goal          Materials Provided: Verbal education about aide and attendance application process provided by phone  Follow Up Plan: SW will follow up with [name] by phone over the next two weeks  [name], Dana, CDP [facility] / [facility] Care Management Social Worker [phone]

## 2018-04-13 NOTE — Chronic Care Management (AMB) (Signed)
  Chronic Care Management    Clinical Social Work Follow Up Note  04/13/2018 Name: Tammy [name] MRN: [id number] DOB: 1933/07/01  Tammy [name] is a 85 y.o. year old female who is a primary care patient of Tammy Felts, FNP. The CCM team was consulted for assistance with Tammy [name].   Review of patient status, including review of consultants reports, other relevant assessments, and collaboration with appropriate care team members and the patient's provider was performed as part of comprehensive patient evaluation and provision of chronic care management services.     Goals Addressed            This Visit's Progress     Patient Stated   . "I want to go back home" (pt-stated)       Current Barriers:  . Limited social support . Level of care concerns . ADL IADL limitations . Limited access to caregiver . Memory Deficits  Clinical Social Work Clinical Goal(s):  [name] Over the next 30 days, patient will work with SW to address needs related to iADL limitation concerns when the patient returns home . Over the next 45 days, client will follow up with Tammy [name] as directed by SW  Interventions: . SW educated the patients POA on aide and attendance application process . SW completed an aide and attendance application via phone with the assistance of patients POA . SW collaborated with the patients primary care provider to complete the medical portion of application . SW discussed plan moving forward with the patients POA in relation to application submission and waiting period  Patient Self Care Activities:  . Currently UNABLE TO independently care for self due to decressed skills with executive function including but not limited to managing money and decision making.  . The patient is UNABLE TO independently drive, perform iADL's without prompting, or administer medications  Please see past updates related to this goal by clicking on the "Past Updates" button in the  selected goal          Follow Up Plan: SW will follow up with Tammy [name] by phone over the next two weeks  Tammy [name], Tammy [name], CDP Tammy [name] / Tammy [name] [phone]  Total time spent performing care coordination and/or care management activities with the patient by phone or face to face = 40 minutes.

## 2018-04-28 ENCOUNTER — Encounter: Payer: Self-pay | Admitting: Nurse Practitioner

## 2018-05-01 ENCOUNTER — Telehealth: Payer: Medicare Other

## 2018-05-01 ENCOUNTER — Ambulatory Visit: Payer: Self-pay

## 2018-05-01 ENCOUNTER — Other Ambulatory Visit: Payer: Self-pay

## 2018-05-01 DIAGNOSIS — R413 Other amnesia: Secondary | ICD-10-CM

## 2018-05-01 DIAGNOSIS — I10 Essential (primary) hypertension: Secondary | ICD-10-CM

## 2018-05-01 NOTE — Chronic Care Management (AMB) (Signed)
Chronic Care Management   Initial Visit Note  05/01/2018 Name: Tammy [name] MRN: [id number] DOB: 12/03/1933  Referred by: Arnette FeltsMoore, Janece, FNP Reason for referral : Chronic Care Management (Telephone CCM RN outreach for initial intake )   Tammy [name] is a 85 y.o. year old female who is a primary care patient of Arnette FeltsMoore, Janece, FNP. The CCM team was consulted for assistance with chronic disease management and care coordination needs.   Review of patient status, including review of consultants reports, relevant laboratory and other test results, and collaboration with appropriate care team members and the patient's provider was performed as part of comprehensive patient evaluation and provision of chronic care management services.    I spoke with Margarita RanaKimberly Jeffries, patient's niece and POA today by telephone.   Objective:  Lab Results  Component Value Date   HGBA1C 5.6 08/24/2017   Lab Results  Component Value Date   LDLCALC 109 (H) 04/05/2018   CREATININE 0.84 04/05/2018   BP Readings from Last 3 Encounters:  04/05/18 140/84  12/01/17 (!) 150/82  11/23/17 (!) 150/90    Goals Addressed    . "My aunt's memory is getting worse"       Niece stated  Current Barriers:  [name]. Knowledge Deficits related to disease process for Dementia and Self Health management  . Cognitive Deficits  Nurse Case Manager Clinical Goal(s):  [name]. Over the next 30 days, niece Cala BradfordKimberly [facility](POA) will verbalize understanding of the plan for patient to follow up with a Neurologist.  . Over the next 60 days, niece Cala BradfordKimberly will have an increased understanding of the disease process related to Dementia and knowing what to expect.  . Over the next 90 days, niece Cala BradfordKimberly will have a plan in place for long term care of the patient.    Interventions:   Telephone CCM follow up with niece Margarita RanaKimberly Jeffries [facility](POA) for initial RNCM intake . Evaluation of current treatment plan related to treatment and management of  Dementia and patient's adherence to plan as established by provider (pt is taking Namenda w/o benefits, niece states patient's memory is consistently impaired, she is becoming "short tempered and easily agitated" at times, niece is fearful that patient will not be able to safely return to he home)  . Encouraged niece to discretely place patient's care keys in a safe place out of patient's reach to help avoid patient from driving her car with impaired memory/cognitive deficits . Provided education to patient re: disease process for Dementia and Alzheimer's, discussed having patient f/u with a Neurologist, discussed in home ST for brain mnemonics (niece will consider after COVID-19) . Collaborated with provider Arnette FeltsJanece Moore, FNP regarding: request for a Neurology referral  . Discussed plans with patient for ongoing care management follow up and provided patient with direct contact information for care management team . Provided niece with printed educational materials related to Dementia/Alzheimer's ([facility] office staff to mail) . Social Work referral for resources for assisted living facility's for future consideration if needed and Dementia specific education related to stages and what to expect/plan for . Scheduled a CCM follow up appointment with niece Margarita RanaKimberly Jeffries in 2-3 weeks  Patient Self Care Activities:  . Currently UNABLE TO independently perform Self Health Care (sister Florentina AddisonKatie and niece Cala BradfordKimberly are assisting with all care needs and transportation)  Initial goal documentation       The CM team will reach out to the patient again over the next 2-3 weeks.   Delsa SaleAngel Vito Beg, RN,CCM  Care Management Coordinator [facility] Care Management/[facility]  Direct Phone: [phone]

## 2018-05-01 NOTE — Patient Instructions (Addendum)
Visit Information  Goals Addressed    . "My aunt's memory is getting worse"       Niece stated  Current Barriers:  [name] Knowledge Deficits related to disease process for Dementia and Self Health management  . Cognitive Deficits  Nurse Case Manager Clinical Goal(s):  [name] Over the next 30 days, niece [name] [facility]) will verbalize understanding of the plan for patient to follow up with a Neurologist.  . Over the next 60 days, niece [name] will have an increased understanding of the disease process related to Dementia and knowing what to expect.  . Over the next 90 days, niece [name] will have a plan in place for long term care of the patient.    Interventions:   Telephone CCM follow up with niece [name] [facility]) for initial RNCM intake . Evaluation of current treatment plan related to treatment and management of Dementia and patient's adherence to plan as established by provider (pt is taking Namenda w/o benefits, niece states patient's memory is consistently impaired, she is becoming "short tempered and easily agitated" at times, niece is fearful that patient will not be able to safely return to he home)  . Encouraged niece to discretely place patient's care keys in a safe place out of patient's reach to help avoid patient from driving her car with impaired memory/cognitive deficits . Provided education to patient re: disease process for Dementia and Alzheimer's, discussed having patient f/u with a Neurologist, discussed in home ST for brain mnemonics (niece will consider after COVID-19) . Collaborated with provider Arnette Felts, FNP regarding: request for a Neurology referral  . Discussed plans with patient for ongoing care management follow up and provided patient with direct contact information for care management team . Provided niece with printed educational materials related to Dementia/Alzheimer's ([facility] office staff to mail) . Social Work referral for resources for assisted living  facility's for future consideration if needed and Dementia specific education related to stages and what to expect/plan for . Scheduled a CCM follow up appointment with niece [name] in 2-3 weeks  Patient Self Care Activities:  . Currently UNABLE TO independently perform Self Health Care (sister Florentina Addison and niece [name] are assisting with all care needs and transportation)  Initial goal documentation       The patient verbalized understanding of instructions provided today and declined a print copy of patient instruction materials.   The CM team will reach out to the patient again over the next 2-3 weeks.   Delsa Sale, RN,CCM Care Management Coordinator [facility] Care Management/[facility]  Direct Phone: [phone]

## 2018-05-02 ENCOUNTER — Other Ambulatory Visit: Payer: Self-pay | Admitting: Nurse Practitioner

## 2018-05-02 DIAGNOSIS — R413 Other amnesia: Secondary | ICD-10-CM

## 2018-05-02 NOTE — Progress Notes (Signed)
n

## 2018-05-03 ENCOUNTER — Other Ambulatory Visit: Payer: Self-pay

## 2018-05-03 ENCOUNTER — Ambulatory Visit: Payer: Medicare Other

## 2018-05-03 DIAGNOSIS — E782 Mixed hyperlipidemia: Secondary | ICD-10-CM

## 2018-05-03 DIAGNOSIS — R413 Other amnesia: Secondary | ICD-10-CM

## 2018-05-03 DIAGNOSIS — I10 Essential (primary) hypertension: Secondary | ICD-10-CM

## 2018-05-03 NOTE — Chronic Care Management (AMB) (Signed)
Chronic Care Management    Clinical Social Work Follow Up Note  05/03/2018 Name: Tammy [name] MRN: [id number] DOB: 04-05-1933  Tammy [name] is a 85 y.o. year old female who is a primary care patient of Arnette Felts, FNP. The CCM team was consulted for assistance with chronic disease management and care coordination.  Review of patient status, including review of consultants reports, other relevant assessments, and collaboration with appropriate care team members and the patient's provider was performed as part of comprehensive patient evaluation and provision of chronic care management services.     Goals Addressed            This Visit's Progress     Patient Stated   . "I want to go back home" (pt-stated)       Current Barriers:  . Limited social support . Level of care concerns . ADL IADL limitations . Limited access to caregiver . Memory Deficits  Clinical Social Work Clinical Goal(s):  [name] Over the next 30 days, patient will work with SW to address needs related to iADL limitation concerns when the patient returns home . Over the next 45 days, client will follow up with [facility] as directed by SW  Interventions: . SW reviewed aide and attendance application process- family reports not receiving in the mail . Collaboration with primary provider to obtain update on status of application - application completed last week and handed to staff member to mail patient  Patient Self Care Activities:  . Currently UNABLE TO independently care for self due to decressed skills with executive function including but not limited to managing money and decision making.  . The patient is UNABLE TO independently drive, perform iADL's without prompting, or administer medications  Please see past updates related to this goal by clicking on the "Past Updates" button in the selected goal        Other   . "My aunt's memory is getting worse"       Niece stated  Current Barriers:   [name] Knowledge Deficits related to disease process for Dementia and Self Health management  . Cognitive Deficits  Nurse Case Manager Clinical Goal(s):  [name] Over the next 30 days, niece [name] [facility]) will verbalize understanding of the plan for patient to follow up with a Neurologist.  . Over the next 60 days, niece [name] will have an increased understanding of the disease process related to Dementia and knowing what to expect.  . Over the next 90 days, niece [name] will have a plan in place for long term care of the patient.    Interventions:   Education provided to the patients niece and POA [name] regarding stages of dementia  Updated [name] on the neurology referral being placed by the patients provider and to expect a call in the coming weeks to schedule an appointment  Educated about validation and the importance of using redirection  Provided examples of how to redirect the patient during moments of agitation  Reviewed the importance of maintaining a consistent daily schedule  Advised [name] to have her [employer] (whom the patient lives with) to keep a log of when the patient is agitated related to time of day, circumstance leading up to agitation, and how the patient was redirected  Encouraged [name] to contact the patients provider if the patients agitation increases of is unmanageable to discuss if medication intervention is warranted   Discussed different types of dementia and how the progression of the disease  varies somewhat based on types the patient may have  Focused on vascular dementia due to the patients diagnoses HTN and the importance of managing patients HTN through diet and medication management -Explained that the neurologist would provide more in depth information regarding possible dementia specific diagnosis and progression  Encouraged Cannon KettleKim Jeffries to follow up with CCM RN Case Manager Lawanna KobusAngel Little regarding goals addressing the patients chronic  conditions  Reinforced the importance of keeping the patients keys considering the family does not feel comfortable with the patient driving  Collaboration with patients primary provider to update on the families plan to track the patients agitation  Briefly discussed options surrounding in home assistance, adult day programs, and ALF placement - the patients father had dementia and was eventually placed in a memory care facility. The family does not feel the patient is ready for placement at this time  Provided resources for telephonic support groups for caregivers of persons living with dementia  Reinforced the role of CCM SW and CCM RN Case Manager and encouraged the family to outreach either provider at any time when needed related to patient care needs   Patient Self Care Activities:  . Currently UNABLE TO independently perform Self Health Care (sister Florentina AddisonKatie and niece Cala BradfordKimberly are assisting with all care needs and transportation)  Please see past updates related to this goal by clicking on the "Past Updates" button in the selected goal           Follow Up Plan: SW will follow up with patient by phone over the next two weeks.   Bevelyn NgoKendra Dejanee Thibeaux, BSW, CDP [facility] / [facility] Care Management Social Worker [phone]  Total time spent performing care coordination and/or care management activities with the patient by phone or face to face = 60 minutes.

## 2018-05-03 NOTE — Patient Instructions (Signed)
Social Worker Visit Information  Goals we discussed today:  Goals Addressed            This Visit's Progress     Patient Stated   . "I want to go back home" (pt-stated)       Current Barriers:  . Limited social support . Level of care concerns . ADL IADL limitations . Limited access to caregiver . Memory Deficits  Clinical Social Work Clinical Goal(s):  [name] Over the next 30 days, patient will work with SW to address needs related to iADL limitation concerns when the patient returns home . Over the next 45 days, client will follow up with [facility] as directed by SW  Interventions: . SW reviewed aide and attendance application process- family reports not receiving in the mail . Collaboration with primary provider to obtain update on status of application - application completed last week and handed to staff member to mail patient  Patient Self Care Activities:  . Currently UNABLE TO independently care for self due to decressed skills with executive function including but not limited to managing money and decision making.  . The patient is UNABLE TO independently drive, perform iADL's without prompting, or administer medications  Please see past updates related to this goal by clicking on the "Past Updates" button in the selected goal        Other   . "My aunt's memory is getting worse"       Niece stated  Current Barriers:  [name] Knowledge Deficits related to disease process for Dementia and Self Health management  . Cognitive Deficits  Nurse Case Manager Clinical Goal(s):  [name] Over the next 30 days, niece [name] [facility]) will verbalize understanding of the plan for patient to follow up with a Neurologist.  . Over the next 60 days, niece [name] will have an increased understanding of the disease process related to Dementia and knowing what to expect.  . Over the next 90 days, niece [name] will have a plan in place for long term care of the patient.    Interventions:    Education provided to the patients niece and POA [name] regarding stages of dementia  Updated [name] on the neurology referral being placed by the patients provider and to expect a call in the coming weeks to schedule an appointment  Educated about validation and the importance of using redirection  Provided examples of how to redirect the patient during moments of agitation  Reviewed the importance of maintaining a consistent daily schedule  Advised [name] to have her [employer] (whom the patient lives with) to keep a log of when the patient is agitated related to time of day, circumstance leading up to agitation, and how the patient was redirected  Encouraged [name] to contact the patients provider if the patients agitation increases of is unmanageable to discuss if medication intervention is warranted   Discussed different types of dementia and how the progression of the disease varies somewhat based on types the patient may have  Focused on vascular dementia due to the patients diagnoses HTN and the importance of managing patients HTN through diet and medication management -Explained that the neurologist would provide more in depth information regarding possible dementia specific diagnosis and progression  Encouraged [name] to follow up with CCM RN Case Manager Lawanna Kobus Little regarding goals addressing the patients chronic conditions  Reinforced the importance of keeping the patients keys considering the family does not feel comfortable with the patient driving  Collaboration with patients primary provider to update on the families plan to track the patients agitation  Briefly discussed options surrounding in home assistance, adult day programs, and ALF placement - the patients father had dementia and was eventually placed in a memory care facility. The family does not feel the patient is ready for placement at this time  Provided resources for telephonic support groups  for caregivers of persons living with dementia  Reinforced the role of CCM SW and CCM RN Case Manager and encouraged the family to outreach either provider at any time when needed related to patient care needs   Patient Self Care Activities:  . Currently UNABLE TO independently perform Self Health Care (sister Florentina AddisonKatie and niece Cala BradfordKimberly are assisting with all care needs and transportation)  Please see past updates related to this goal by clicking on the "Past Updates" button in the selected goal           Materials Provided: Yes: E-mailed telephonic support group resources  Follow Up Plan: SW will follow up with patient by phone over the next two weeks.   Bevelyn NgoKendra Jacori Mulrooney, BSW, CDP [facility] / [facility] Care Management Social Worker [phone]

## 2018-05-04 ENCOUNTER — Other Ambulatory Visit: Payer: Self-pay | Admitting: Nurse Practitioner

## 2018-05-04 DIAGNOSIS — R413 Other amnesia: Secondary | ICD-10-CM

## 2018-05-15 ENCOUNTER — Telehealth: Payer: Self-pay

## 2018-05-15 ENCOUNTER — Ambulatory Visit (INDEPENDENT_AMBULATORY_CARE_PROVIDER_SITE_OTHER): Payer: Medicare Other

## 2018-05-15 DIAGNOSIS — E782 Mixed hyperlipidemia: Secondary | ICD-10-CM

## 2018-05-15 DIAGNOSIS — I10 Essential (primary) hypertension: Secondary | ICD-10-CM | POA: Diagnosis not present

## 2018-05-15 DIAGNOSIS — R413 Other amnesia: Secondary | ICD-10-CM

## 2018-05-15 NOTE — Chronic Care Management (AMB) (Signed)
Chronic Care Management    Clinical Social Work Follow Up Note  05/15/2018 Name: Tammy [name] MRN: [id number] DOB: [birth year]  Tammy [name] is a 85 y.o. year old female who is a primary care patient of Arnette Felts, FNP. The CCM team was consulted for assistance with chronic care management.   Review of patient status, including review of consultants reports, other relevant assessments, and collaboration with appropriate care team members and the patient's provider was performed as part of comprehensive patient evaluation and provision of chronic care management services.     I placed a follow up call to the patients niece and POA Tammy [name] to follow up on current goal progression. Two patient HIPAA identifiers confirmed.   Goals Addressed            This Visit's Progress     Patient Stated   . "I want to go back home" (pt-stated)   Not on track    Current Barriers:  . Limited social support . Level of care concerns . ADL IADL limitations . Limited access to caregiver . Memory Deficits  Clinical Social Work Clinical Goal(s):  [name] Over the next 30 days, patient will work with SW to address needs related to iADL limitation concerns when the patient returns home . Over the next 45 days, client will follow up with [facility] as directed by SW  Interventions: . SW reviewed aide and attendance application process- family reports not receiving in the mail  Collaboration with primary provider office requesting date application mailed to the patient's current address  Patient Self Care Activities:  . Currently UNABLE TO independently care for self due to decressed skills with executive function including but not limited to managing money and decision making.  . The patient is UNABLE TO independently drive, perform iADL's without prompting, or administer medications  Please see past updates related to this goal by clicking on the "Past Updates" button in the  selected goal        Other   . "My aunt's memory is getting worse"   On track    Niece stated  Current Barriers:  [name] Knowledge Deficits related to disease process for Dementia and Self Health management  . Cognitive Deficits  Nurse Case Manager Clinical Goal(s):  [name] Over the next 30 days, niece Tammy [name] [facility]) will verbalize understanding of the plan for patient to follow up with a Neurologist. Completed . Over the next 60 days, niece Tammy [name] will have an increased understanding of the disease process related to Dementia and knowing what to expect.  . Over the next 90 days, niece Tammy [name] will have a plan in place for long term care of the patient.    Interventions:   Telephonic follow up with CCM SW  Confirmed knowledge of patients upcomming neurology referral on June 9th to see Dr. Epimenio Foot with [facility]  Discussed patients recent agitation regarding wanting to go home  Provided active listening and positive reinforcement for the patients family member's efforts to keep the patient safe and well cared for   Patient Self Care Activities:  . Currently UNABLE TO independently perform Self Health Care (sister Tammy [name] and niece Tammy [name] are assisting with all care needs and transportation)  Please see past updates related to this goal by clicking on the "Past Updates" button in the selected goal           Follow Up Plan: SW to follow up with the patients POA once determining status of  aide and attendance application.   Bevelyn NgoKendra Aitanna Haubner, BSW, CDP [facility] / [facility] Care Management Social Worker [phone]  Total time spent performing care coordination and/or care management activities with the patient by phone or face to face = 15 minutes.

## 2018-05-15 NOTE — Patient Instructions (Signed)
Social Worker Visit Information  Goals we discussed today:  Goals Addressed            This Visit's Progress     Patient Stated   . "I want to go back home" (pt-stated)   Not on track    Current Barriers:  . Limited social support . Level of care concerns . ADL IADL limitations . Limited access to caregiver . Memory Deficits  Clinical Social Work Clinical Goal(s):  [name] Over the next 30 days, patient will work with SW to address needs related to iADL limitation concerns when the patient returns home . Over the next 45 days, client will follow up with [facility] as directed by SW  Interventions: . SW reviewed aide and attendance application process- family reports not receiving in the mail  Collaboration with primary provider office requesting date application mailed to the patient's current address  Patient Self Care Activities:  . Currently UNABLE TO independently care for self due to decressed skills with executive function including but not limited to managing money and decision making.  . The patient is UNABLE TO independently drive, perform iADL's without prompting, or administer medications  Please see past updates related to this goal by clicking on the "Past Updates" button in the selected goal        Other   . "My aunt's memory is getting worse"   On track    Niece stated  Current Barriers:  [name] Knowledge Deficits related to disease process for Dementia and Self Health management  . Cognitive Deficits  Nurse Case Manager Clinical Goal(s):  [name] Over the next 30 days, niece [name] [facility]) will verbalize understanding of the plan for patient to follow up with a Neurologist. Completed . Over the next 60 days, niece [name] will have an increased understanding of the disease process related to Dementia and knowing what to expect.  . Over the next 90 days, niece [name] will have a plan in place for long term care of the patient.    Interventions:   Telephonic  follow up with CCM SW  Confirmed knowledge of patients upcomming neurology referral on June 9th to see Dr. Epimenio Foot with [facility]  Discussed patients recent agitation regarding wanting to go home  Provided active listening and positive reinforcement for the patients family member's efforts to keep the patient safe and well cared for   Patient Self Care Activities:  . Currently UNABLE TO independently perform Self Health Care (sister Florentina Addison and niece [name] are assisting with all care needs and transportation)  Please see past updates related to this goal by clicking on the "Past Updates" button in the selected goal           Materials Provided: Verbal education about dementia provided by phone  Follow Up Plan: SW to follow up once discussing status of aide and attendance application with provider office   [name], BSW, CDP [facility] / [facility] Care Management Social Worker [phone]

## 2018-05-18 ENCOUNTER — Telehealth: Payer: Self-pay

## 2018-05-19 ENCOUNTER — Telehealth: Payer: Self-pay

## 2018-05-22 ENCOUNTER — Ambulatory Visit: Payer: Medicare Other

## 2018-05-22 DIAGNOSIS — I10 Essential (primary) hypertension: Secondary | ICD-10-CM

## 2018-05-22 DIAGNOSIS — R413 Other amnesia: Secondary | ICD-10-CM

## 2018-05-22 DIAGNOSIS — E782 Mixed hyperlipidemia: Secondary | ICD-10-CM

## 2018-05-22 NOTE — Patient Instructions (Signed)
Social Worker Visit Information  Goals we discussed today:  Goals Addressed            This Visit's Progress     Patient Stated   . "I want to go back home" (pt-stated)       Current Barriers:  . Limited social support . Level of care concerns . ADL IADL limitations . Limited access to caregiver . Memory Deficits  Clinical Social Work Clinical Goal(s):  [name] Over the next 30 days, patient will work with SW to address needs related to iADL limitation concerns when the patient returns home  . Over the next 45 days, client will follow up with [facility] as directed by SW not met; re-established as a goal on 05/22/18  Interventions:  Telephonic follow up by [place] application status - the patient and her family have yet to receive mailed copy  Confirmed mailing address  Completed application on behalf of the patient and her [place] with the patients primary provider regarding application completion  Discussed plan to e-mail a secure copy to the business manager at the provider office in order to have the document printed and hand delivered to the patients provider  Confirmed the patients POA would like the document mailed to the address listed under the patients demographic tab at the attention of Dorann Ou  Patient Self Care Activities:  . Currently UNABLE TO independently care for self due to decressed skills with executive function including but not limited to managing money and decision making.  . The patient is UNABLE TO independently drive, perform iADL's without prompting, or administer medications  Please see past updates related to this goal by clicking on the "Past Updates" button in the selected goal          Materials Provided: No: Patient declined  Follow Up Plan: SW will follow up with patient by phone over the next two weeks   Daneen Schick, BSW, CDP [facility] / [place] Management Social Worker [phone]

## 2018-05-22 NOTE — Chronic Care Management (AMB) (Signed)
  Chronic Care Management    Clinical Social Work Follow Up Note  05/22/2018 Name: Tammy [name] MRN: [id number] DOB: 06/07/1933  Tammy [name] is a 85 y.o. year old female who is a primary care patient of [name], [place]. The CCM team was consulted for assistance with [employer].   Review of patient status, including review of consultants reports, other relevant assessments, and collaboration with appropriate care team members and the patient's provider was performed as part of comprehensive patient evaluation and provision of chronic care management services.     Goals Addressed            This Visit's Progress     Patient Stated   . "I want to go back home" (pt-stated)       Current Barriers:  . Limited social support . Level of care concerns . ADL IADL limitations . Limited access to caregiver . Memory Deficits  Clinical Social Work Clinical Goal(s):  [name] Over the next 30 days, patient will work with SW to address needs related to iADL limitation concerns when the patient returns home  . Over the next 45 days, client will follow up with [facility] as directed by SW not met; re-established as a goal on 05/22/18  Interventions:  Telephonic follow up by [place] application status - the patient and her family have yet to receive mailed copy  Confirmed mailing address  Completed application on behalf of the patient and her [place] with the patients primary provider regarding application completion  Discussed plan to e-mail a secure copy to the business manager at the provider office in order to have the document printed and hand delivered to the patients provider  Confirmed the patients POA would like the document mailed to the address listed under the patients demographic tab at the attention of Dorann Ou  Patient Self Care Activities:  . Currently UNABLE TO independently care for self due to decressed skills with executive  function including but not limited to managing money and decision making.  . The patient is UNABLE TO independently drive, perform iADL's without prompting, or administer medications  Please see past updates related to this goal by clicking on the "Past Updates" button in the selected goal          Follow Up Plan: SW will follow up with patient by phone over the next two weeks.   Daneen Schick, BSW, CDP [facility] / [facility] Care Management Social Worker [phone]  Total time spent performing care coordination and/or care management activities with the patient by phone or face to face = 25 minutes.

## 2018-05-23 ENCOUNTER — Telehealth: Payer: Self-pay

## 2018-05-23 NOTE — Telephone Encounter (Signed)
Patient's forms have been completed and mailed to patient's niece [name]. YRL,RMA

## 2018-05-24 ENCOUNTER — Telehealth: Payer: Self-pay

## 2018-05-25 ENCOUNTER — Telehealth: Payer: Medicare Other

## 2018-05-25 ENCOUNTER — Other Ambulatory Visit: Payer: Self-pay

## 2018-05-25 ENCOUNTER — Ambulatory Visit: Payer: Self-pay

## 2018-05-25 DIAGNOSIS — E782 Mixed hyperlipidemia: Secondary | ICD-10-CM | POA: Diagnosis not present

## 2018-05-25 DIAGNOSIS — I10 Essential (primary) hypertension: Secondary | ICD-10-CM

## 2018-05-25 DIAGNOSIS — R413 Other amnesia: Secondary | ICD-10-CM

## 2018-05-25 NOTE — Chronic Care Management (AMB) (Signed)
  Chronic Care Management   Follow Up Note   05/25/2018 Name: Tammy [name] MRN: [id number] DOB: 10-28-1933  Referred by: Arnette Felts, FNP Reason for referral : Chronic Care Management (CCM RN Telephone Outreach)   Tammy [name] is a 85 y.o. year old female who is a primary care patient of Arnette Felts, FNP. The CCM team was consulted for assistance with chronic disease management and care coordination needs.    Review of patient status, including review of consultants reports, relevant laboratory and other test results, and collaboration with appropriate care team members and the patient's provider was performed as part of comprehensive patient evaluation and provision of chronic care management services.    I spoke with patient's POA [name] by telephone today.   Goals Addressed            This Visit's Progress   . "My aunt's memory is getting worse"       Niece stated  Current Barriers:  [name] Knowledge Deficits related to disease process for Dementia and Self Health management  . Cognitive Deficits  Nurse Case Manager Clinical Goal(s):  [name] Over the next 30 days, niece [name] [facility]) will verbalize understanding of the plan for patient to follow up with a Neurologist. Completed . Over the next 60 days, niece [name] will have an increased understanding of the disease process related to Dementia and knowing what to expect.  . Over the next 90 days, niece [name] will have a plan in place for long term care of the patient.    Interventions:   Completed CCM follow up call with POA [name]   Confirmed knowledge of patients upcomming neurology referral on June 9th to see Dr. Epimenio Foot with [facility]  Discussed patients recent agitation regarding wanting to go home  Provided active listening and positive reinforcement for the patients family member's efforts to keep the patient safe and well cared for   Patient Self Care Activities:   . Currently UNABLE TO independently perform Self Health Care (sister Florentina Addison and niece [name] are assisting with all care needs and transportation)  Please see past updates related to this goal by clicking on the "Past Updates" button in the selected goal         The CM team will reach out to the patient again over the next 2-3 weeks.    Delsa Sale, RN,CCM Care Management Coordinator [facility] Care Management/[facility]  Direct Phone: [phone]

## 2018-05-30 ENCOUNTER — Ambulatory Visit: Payer: Self-pay

## 2018-05-30 DIAGNOSIS — E782 Mixed hyperlipidemia: Secondary | ICD-10-CM

## 2018-05-30 DIAGNOSIS — I10 Essential (primary) hypertension: Secondary | ICD-10-CM

## 2018-05-30 DIAGNOSIS — R413 Other amnesia: Secondary | ICD-10-CM

## 2018-05-30 NOTE — Chronic Care Management (AMB) (Signed)
  Chronic Care Management    Clinical Social Work Follow Up Note  05/30/2018 Name: CLAIRE DOLORES MRN: [id number] DOB: 1933/09/10  MERILYN PAGAN is a 85 y.o. year old female who is a primary care patient of [name], [place]. The CCM team was consulted for assistance with [employer] and Level of Care Concerns.   Review of patient status, including review of consultants reports, other relevant assessments, and collaboration with appropriate care team members and the patient's provider was performed as part of comprehensive patient evaluation and provision of chronic care management services.    I received a telephone call from the patients niece, [name], who is the patients power of attorney.   Goals Addressed            This Visit's Progress     Patient Stated   . "I want to go back home" (pt-stated)   On track    Current Barriers:  . Limited social support . Level of care concerns . ADL IADL limitations . Limited access to caregiver . Memory Deficits  Clinical Social Work Clinical Goal(s):  [name] Over the next 30 days, patient will work with SW to address needs related to iADL limitation concerns when the patient returns home  . Over the next 45 days, client will follow up with [facility] as directed by SW not met; re-established as a goal on 05/22/18  Interventions:  Confirmed patient's niece received aid and attendance application   Reviewed application and explained steps to have completed  Advised [name] to submit the application to the [place] as soon as possible to initiation the application  Patient Self Care Activities:  . Currently UNABLE TO independently care for self due to decressed skills with executive function including but not limited to managing money and decision making.  . The patient is UNABLE TO independently drive, perform iADL's without prompting, or administer medications  Please see past updates related to this goal by clicking  on the "Past Updates" button in the selected goal          Follow Up Plan: SW will follow up with the patients and her niece over the next 3-4 weeks.   Daneen Schick, BSW, CDP [facility] / [facility] Care Management Social Worker [phone]  Total time spent performing care coordination and/or care management activities with the patient by phone or face to face = 9 minutes.

## 2018-05-30 NOTE — Patient Instructions (Signed)
Social Worker Visit Information  Goals we discussed today:  Goals Addressed            This Visit's Progress     Patient Stated   . "I want to go back home" (pt-stated)   On track    Current Barriers:  . Limited social support . Level of care concerns . ADL IADL limitations . Limited access to caregiver . Memory Deficits  Clinical Social Work Clinical Goal(s):  [name] Over the next 30 days, patient will work with SW to address needs related to iADL limitation concerns when the patient returns home  . Over the next 45 days, client will follow up with [facility] as directed by SW not met; re-established as a goal on 05/22/18  Interventions:  Confirmed patient's niece received aid and attendance application   Reviewed application and explained steps to have completed  Advised [name] to submit the application to the [place] as soon as possible to initiation the application  Patient Self Care Activities:  . Currently UNABLE TO independently care for self due to decressed skills with executive function including but not limited to managing money and decision making.  . The patient is UNABLE TO independently drive, perform iADL's without prompting, or administer medications  Please see past updates related to this goal by clicking on the "Past Updates" button in the selected goal          Materials Provided: Verbal education about aid and attendance provided by phone  Follow Up Plan: SW will follow up with patient by phone over the next 3-4 weeks   Daneen Schick, [place], CDP [facility] / [place] Management Social Worker [phone]

## 2018-05-31 ENCOUNTER — Telehealth: Payer: Self-pay

## 2018-06-09 ENCOUNTER — Telehealth: Payer: Self-pay

## 2018-06-12 ENCOUNTER — Other Ambulatory Visit: Payer: Self-pay | Admitting: Nurse Practitioner

## 2018-06-12 DIAGNOSIS — I10 Essential (primary) hypertension: Secondary | ICD-10-CM

## 2018-06-22 ENCOUNTER — Ambulatory Visit: Payer: Medicare Other

## 2018-06-22 ENCOUNTER — Telehealth: Payer: Medicare Other

## 2018-06-22 ENCOUNTER — Ambulatory Visit (INDEPENDENT_AMBULATORY_CARE_PROVIDER_SITE_OTHER): Payer: Medicare Other

## 2018-06-22 ENCOUNTER — Other Ambulatory Visit: Payer: Self-pay

## 2018-06-22 DIAGNOSIS — R413 Other amnesia: Secondary | ICD-10-CM

## 2018-06-22 DIAGNOSIS — I10 Essential (primary) hypertension: Secondary | ICD-10-CM

## 2018-06-22 NOTE — Chronic Care Management (AMB) (Signed)
Chronic Care Management   Follow Up Note   06/22/2018 Name: Tammy [name] MRN: [id number] DOB: [birth year]  Referred by: [name], FNP Reason for referral : Chronic Care Management (CCM RN Telephone Follow Up )   Tammy [name] is a 85 y.o. year old female who is a primary care patient of [name], [place]. The CCM team was consulted for assistance with chronic disease management and care coordination needs.    Review of patient status, including review of consultants reports, relevant laboratory and other test results, and collaboration with appropriate care team members and the patient's provider was performed as part of comprehensive patient evaluation and provision of chronic care management services.    I spoke with niece and POA Tammy [name] by telephone today to f/u on patient's memory and behavior changes.   Goals Addressed      Patient Stated   . "I want to go back home" (pt-stated)       Current Barriers:  . Limited social support . Level of care concerns . ADL IADL limitations . Limited access to caregiver . Memory Deficits  Clinical Social Work Clinical Goal(s):  [name] Over the next 30 days, patient will work with SW to address needs related to iADL limitation concerns when the patient returns home  . Over the next 45 days, client will follow up with [facility] as directed by SW not met; re-established as a goal on 05/22/18 COMPLETED . New Goal 06/22/18: Over the next 60 days the patient and her POA will work with the [place] regarding caregiver needs.  CCM SW Interventions: Completed on 06/22/18: completed with POA Tammy [name]   Confirmed the patients aide and attendance application has been submitted to the [place] office - Tammy [name] reports she received a letter confirming the application had been received and would be processed  Encouraged Tammy [name] to remain in contact with the [place] office and provide any new relevant physician information in the  coming weeks  Patient Self Care Activities:  . Currently UNABLE TO independently care for self due to decressed skills with executive function including but not limited to managing money and decision making.  . The patient is UNABLE TO independently drive, perform iADL's without prompting, or administer medications  Please see past updates related to this goal by clicking on the "Past Updates" button in the selected goal        Other   . "My aunt's memory is getting worse"       Niece stated  Current Barriers:  [name] Knowledge Deficits related to disease process for Dementia and Self Health management  . Cognitive Deficits  Nurse Case Manager Clinical Goal(s):  [name] Over the next 30 days, niece [facility]my [name]) will verbalize understanding of the plan for patient to follow up with a Neurologist. Completed . Over the next 60 days, niece Tammy Millin will have an increased understanding of the disease process related to Dementia and knowing what to expect.  . Over the next 90 days, niece Tammy Millin will have a plan in place for long term care of the patient.    CCM SW Interventions:  Completed 06/22/18  Outbound call to the patients niece and [place] for changes in patients memory- Tammy [name] reports the patient beginning to ask the same question up to 6 times within a 15 minutes conversation. Reports of increased agitation regarding the patients desire to drive and return home to live  Reviewed signs and symptoms  of UTI and advised Tammy [name] to observe the patient for other changes to rule out physical conditions versus changes to the patients cognition due to dementia  Advised Tammy [name] to contact the patients primary care physician if other new symptoms are observed to schedule an office visit  Collaboration with patients primary provider as well as CCM RN Case Manager regarding changes to patients behavior  Encouraged Tammy [name] to write down new behaviors to share  during Tammy [name] neurology consult  Advised Tammy [name] to have all neurology notes and/or diagnoses sent to the [place] for the purpose of adding to recent aide and attendance application  Educated Tammy [name] on caregiver options other than aide and attendance including adult day programs, senior center, and assisted living memory care  Determined Tammy [name] would like to learn more about VA assistance in terms of memory care placement and financial coverage  Outbound call to [facility] , voice message left requesting a return call  Scheduled appointment with patients POA following upcomming neurologist visit   CCM RN CM Interventions:  Completed on 06/22/18: completed call with niece and POA Tammy [name]   Received update from embedded [employer] with an update regarding niece reports patient is having worsening memory deficit with behavior changes  Placed outbound call to niece and [place]  Comprehensive Neuro assessment completed; assessed for other possible causes for cognitive changes including symptoms suggestive of infection -- niece denies  Verbal education provided related to other potential causes for cognitive changes including infection  Reviewed s/s of infection and when to call the provider to report symptoms early for early diagnosis and treatment  Discussed reported symptoms may be related to the dementia disease process  Discussed niece's plan to take patient to her home at which time she will plan to stay with the patient in her home with anticipation patient's symptoms will improve  Instructed niece to call provider [name], FNP and or the CCM team to report persistent or worsening symptoms and or to schedule a virtual visit-niece verbalizes understanding  Discussed plans for ongoing CCM follow up for chronic disease management and care coordination   Discussed upcoming MD follow up appts including new patient  Neuro appt date/time, discussed next AWV scheduled with PCP [name], FNP date/time  Confirmed niece has RN CM contact # and discussed nurse availability  Scheduled a CCM follow up call following Neuro follow up  Patient update forwarded to [place] and provider [name], FNP  Patient Self Care Activities:  . Currently UNABLE TO independently perform [place] (sister Tammy [name] and niece Tammy Millin are assisting with all care needs and transportation)  Please see past updates related to this goal by clicking on the "Past Updates" button in the selected goal     . COMPLETED: Establishment of Goals related to level of care needs       Current Barriers:  [name] Knowledge Barriers related to resources and support available to address needs related to level of care needs.   Case Manager Clinical Goal(s):  [name] Over the next 30 days, patient will work with BSW to address needs related to level of care concerns 06/22/18: Goal Met  Interventions:  . Collaborated with BSW and initiated plan of care to address needs related to level of care concerns  Patient Self Care Activities:  . Calls provider office for new concerns or questions  Initial goal documentation  Telephone follow up appointment with care management team member scheduled for: 07/13/18   Barb Merino, [facility] Care Management Coordinator [place] [facility]  Direct Phone: [phone]

## 2018-06-22 NOTE — Patient Instructions (Signed)
Social Worker Visit Information  Goals we discussed today:  Goals Addressed            This Visit's Progress     Patient Stated   . "I want to go back home" (pt-stated)   On track    Current Barriers:  . Limited social support . Level of care concerns . ADL IADL limitations . Limited access to caregiver . Memory Deficits  Clinical Social Work Clinical Goal(s):  [name] Over the next 30 days, patient will work with SW to address needs related to iADL limitation concerns when the patient returns home  . Over the next 45 days, client will follow up with [facility] as directed by SW not met; re-established as a goal on 05/22/18 COMPLETED . New Goal 06/22/18: Over the next 60 days the patient and her POA will work with the [place] regarding caregiver needs.  Interventions:  Outbound call to the patients niece and POA [name]  Confirmed the patients aide and attendance application has been submitted to the [place] office - Mrs Fran Lowes reports she received a letter confirming the application had been received and would be processed  Encouraged Mrs Gorden Harms to remain in contact with the [place] office and provide any new relevant physician information in the coming weeks  Patient Self Care Activities:  . Currently UNABLE TO independently care for self due to decressed skills with executive function including but not limited to managing money and decision making.  . The patient is UNABLE TO independently drive, perform iADL's without prompting, or administer medications  Please see past updates related to this goal by clicking on the "Past Updates" button in the selected goal        Other   . "My aunt's memory is getting worse"   On track    Niece stated  Current Barriers:  [name] Knowledge Deficits related to disease process for Dementia and Self Health management  . Cognitive Deficits  Nurse Case Manager Clinical Goal(s):  [name] Over the next 30 days, niece Joelene Millin [facility]) will verbalize  understanding of the plan for patient to follow up with a Neurologist. Completed . Over the next 60 days, niece Joelene Millin will have an increased understanding of the disease process related to Dementia and knowing what to expect.  . Over the next 90 days, niece Joelene Millin will have a plan in place for long term care of the patient.    CCM SW Interventions:  Completed 06/22/18  Outbound call to the patients niece and [place] for changes in patients memory- Mrs Gorden Harms reports the patient beginning to ask the same question up to 6 times within a 15 minutes conversation. Reports of increased agitation regarding the patients desire to drive and return home to live  Reviewed signs and symptoms of UTI and advised Mrs Gorden Harms to observe the patient for other changes to rule out physical conditions versus changes to the patients cognition due to dementia  Advised Mrs Gorden Harms to contact the patients primary care physician if other new symptoms are observed to schedule an office visit  Collaboration with patients primary provider as well as CCM RN Case Manager regarding changes to patients behavior  Encouraged Mrs Gorden Harms to write down new behaviors to share during [place] neurology consult  Advised Mrs Gorden Harms to have all neurology notes and/or diagnoses sent to the [place] for the purpose of adding to recent aide and attendance application  Educated Mrs Gorden Harms on caregiver options other than aide  and attendance including adult day programs, senior center, and assisted living memory care  Determined Mrs Gorden Harms would like to learn more about VA assistance in terms of memory care placement and financial coverage  Outbound call to [facility] , voice message left requesting a return call  Scheduled appointment with patients POA following upcomming neurologist visit    Patient Self Care Activities:  . Currently UNABLE TO independently perform [place] (sister [name] and niece Joelene Millin are assisting with all care needs and transportation)  Please see past updates related to this goal by clicking on the "Past Updates" button in the selected goal           Materials Provided: Verbal education about long term care planning provided by phone  Follow Up Plan: SW will follow up with patient by phone over the next 2-3 weeks   Daneen Schick, BSW, CDP Social Worker, Certified Dementia Practitioner [place] [facility] [place] [facility] [phone]

## 2018-06-22 NOTE — Patient Instructions (Signed)
Visit Information  Goals Addressed      Patient Stated   . "I want to go back home" (pt-stated)       Current Barriers:  . Limited social support . Level of care concerns . ADL IADL limitations . Limited access to caregiver . Memory Deficits  Clinical Social Work Clinical Goal(s):  [name] Over the next 30 days, patient will work with SW to address needs related to iADL limitation concerns when the patient returns home  . Over the next 45 days, client will follow up with [facility] as directed by SW not met; re-established as a goal on 05/22/18 COMPLETED . New Goal 06/22/18: Over the next 60 days the patient and her POA will work with the [place] regarding caregiver needs.  CCM SW Interventions: Completed on 06/22/18: completed with POA [name]   Confirmed the patients aide and attendance application has been submitted to the [place] office - Mrs Fran Lowes reports she received a letter confirming the application had been received and would be processed  Encouraged Mrs Gorden Harms to remain in contact with the [place] office and provide any new relevant physician information in the coming weeks  Patient Self Care Activities:  . Currently UNABLE TO independently care for self due to decressed skills with executive function including but not limited to managing money and decision making.  . The patient is UNABLE TO independently drive, perform iADL's without prompting, or administer medications  Please see past updates related to this goal by clicking on the "Past Updates" button in the selected goal        Other   . "My aunt's memory is getting worse"       Niece stated  Current Barriers:  [name] Knowledge Deficits related to disease process for Dementia and Self Health management  . Cognitive Deficits  Nurse Case Manager Clinical Goal(s):  [name] Over the next 30 days, niece Joelene Millin [facility]) will verbalize understanding of the plan for patient to follow up with a Neurologist. Completed . Over the  next 60 days, niece Joelene Millin will have an increased understanding of the disease process related to Dementia and knowing what to expect.  . Over the next 90 days, niece Joelene Millin will have a plan in place for long term care of the patient.    CCM SW Interventions:  Completed 06/22/18  Outbound call to the patients niece and [place] for changes in patients memory- Mrs Gorden Harms reports the patient beginning to ask the same question up to 6 times within a 15 minutes conversation. Reports of increased agitation regarding the patients desire to drive and return home to live  Reviewed signs and symptoms of UTI and advised Mrs Gorden Harms to observe the patient for other changes to rule out physical conditions versus changes to the patients cognition due to dementia  Advised Mrs Gorden Harms to contact the patients primary care physician if other new symptoms are observed to schedule an office visit  Collaboration with patients primary provider as well as CCM RN Case Manager regarding changes to patients behavior  Encouraged Mrs Gorden Harms to write down new behaviors to share during [place] neurology consult  Advised Mrs Gorden Harms to have all neurology notes and/or diagnoses sent to the [place] for the purpose of adding to recent aide and attendance application  Educated Mrs Gorden Harms on caregiver options other than aide and attendance including adult day programs, senior center, and assisted living memory care  Determined Mrs Gorden Harms would like to learn more  about VA assistance in terms of memory care placement and financial coverage  Outbound call to [facility] , voice message left requesting a return call  Scheduled appointment with patients POA following upcomming neurologist visit   CCM RN CM Interventions:  Completed on 06/22/18: completed call with niece and POA Dorann Ou   Received update from embedded [employer] with an update regarding  niece reports patient is having worsening memory deficit with behavior changes  Placed outbound call to niece and [place]  Comprehensive Neuro assessment completed; assessed for other possible causes for cognitive changes including symptoms suggestive of infection -- niece denies  Verbal education provided related to other potential causes for cognitive changes including infection  Reviewed s/s of infection and when to call the provider to report symptoms early for early diagnosis and treatment  Discussed niece's plan to take patient to her home at which time she will plan to stay with the patient in her home with anticipation patient's symptoms will improve  Instructed niece to call provider [name], FNP and or the CCM team to report persistent or worsening symptoms and or to schedule a virtual visit-niece verbalizes understanding  Discussed reported symptoms may be related to the dementia disease process  Discussed plans for ongoing CCM follow up for chronic disease management and care coordination   Discussed upcoming MD follow up appts including new patient Neuro appt date/time, discussed next AWV scheduled with PCP [name], FNP date/time  Confirmed niece has RN CM contact # and discussed nurse availability  Scheduled a CCM follow up call following Neuro follow up  Patient update forwarded to [place] and provider [name], FNP   Patient Self Care Activities:  . Currently UNABLE TO independently perform [place] (sister [name] and niece Joelene Millin are assisting with all care needs and transportation)  Please see past updates related to this goal by clicking on the "Past Updates" button in the selected goal      . COMPLETED: Establishment of Goals related to level of care needs       Current Barriers:  [name] Knowledge Barriers related to resources and support available to address needs related to level of care needs.   Case Manager  Clinical Goal(s):  [name] Over the next 30 days, patient will work with BSW to address needs related to level of care concerns 06/22/18: Goal Met  Interventions:  . Collaborated with BSW and initiated plan of care to address needs related to level of care concerns  Patient Self Care Activities:  . Calls provider office for new concerns or questions  Initial goal documentation        The patient verbalized understanding of instructions provided today and declined a print copy of patient instruction materials.   Telephone follow up appointment with care management team member scheduled for: 07/13/18  Barb Merino, [facility] Care Management Coordinator [place] [facility]  Direct Phone: [phone]

## 2018-06-22 NOTE — Chronic Care Management (AMB) (Signed)
Chronic Care Management    Clinical Social Work Follow Up Note  06/22/2018 Name: Tammy [name] MRN: [id number] DOB: Apr 17, 1933  Tammy [name] is a 85 y.o. year old female who is a primary care patient of [name], [place]. The CCM team was consulted for assistance with [employer] and Level of Care Concerns.   Review of patient status, including review of consultants reports, other relevant assessments, and collaboration with appropriate care team members and the patient's provider was performed as part of comprehensive patient evaluation and provision of chronic care management services.     I placed a follow up call to the patients POA and caregiver [name] to assess progression of patient goals.   Goals Addressed            This Visit's Progress     Patient Stated   . "I want to go back home" (pt-stated)   On track    Current Barriers:  . Limited social support . Level of care concerns . ADL IADL limitations . Limited access to caregiver . Memory Deficits  Clinical Social Work Clinical Goal(s):  [name] Over the next 30 days, patient will work with SW to address needs related to iADL limitation concerns when the patient returns home  . Over the next 45 days, client will follow up with [facility] as directed by SW not met; re-established as a goal on 05/22/18 COMPLETED . New Goal 06/22/18: Over the next 60 days the patient and her POA will work with the [place] regarding caregiver needs.  Interventions:  Outbound call to the patients niece and POA [name]  Confirmed the patients aide and attendance application has been submitted to the [place] office - Mrs Fran Lowes reports she received a letter confirming the application had been received and would be processed  Encouraged Mrs Gorden Harms to remain in contact with the [place] office and provide any new relevant physician information in the coming weeks  Patient Self Care Activities:  . Currently UNABLE TO  independently care for self due to decressed skills with executive function including but not limited to managing money and decision making.  . The patient is UNABLE TO independently drive, perform iADL's without prompting, or administer medications  Please see past updates related to this goal by clicking on the "Past Updates" button in the selected goal        Other   . "My aunt's memory is getting worse"   On track    Niece stated  Current Barriers:  [name] Knowledge Deficits related to disease process for Dementia and Self Health management  . Cognitive Deficits  Nurse Case Manager Clinical Goal(s):  [name] Over the next 30 days, niece Joelene Millin [facility]) will verbalize understanding of the plan for patient to follow up with a Neurologist. Completed . Over the next 60 days, niece Joelene Millin will have an increased understanding of the disease process related to Dementia and knowing what to expect.  . Over the next 90 days, niece Joelene Millin will have a plan in place for long term care of the patient.    CCM SW Interventions:  Completed 06/22/18  Outbound call to the patients niece and [place] for changes in patients memory- Mrs Gorden Harms reports the patient beginning to ask the same question up to 6 times within a 15 minutes conversation. Reports of increased agitation regarding the patients desire to drive and return home to live  Reviewed signs and symptoms of UTI and advised  Mrs Gorden Harms to observe the patient for other changes to rule out physical conditions versus changes to the patients cognition due to dementia  Advised Mrs Gorden Harms to contact the patients primary care provider if any new symptoms are observed to schedule an office visit  Collaboration with patients primary provider as well as CCM RN Case Manager regarding changes to patients behavior  Encouraged Mrs Gorden Harms to write down new behaviors to share during [place] neurology consult  Advised Mrs Gorden Harms to  have all neurology notes and/or diagnoses sent to the [place] for the purpose of adding to recent aide and attendance application  Educated Mrs Gorden Harms on caregiver options other than aide and attendance including adult day programs, senior center, and assisted living memory care  Determined Mrs Gorden Harms would like to learn more about VA assistance in terms of memory care placement and financial coverage  Outbound call to [facility] , voice message left requesting a return call  Scheduled appointment with patients POA following upcomming neurologist visit    Patient Self Care Activities:  . Currently UNABLE TO independently perform [place] (sister [name] and niece Joelene Millin are assisting with all care needs and transportation)  Please see past updates related to this goal by clicking on the "Past Updates" button in the selected goal        Follow Up Plan: SW will follow up with patient by phone over the next 2-3 weeks   Daneen Schick, BSW, CDP Social Worker, Certified Dementia Practitioner [place] / [place] [facility] [phone]  Total time spent performing care coordination and/or care management activities with the patient by phone or face to face = 50 minutes.

## 2018-07-04 ENCOUNTER — Other Ambulatory Visit: Payer: Self-pay

## 2018-07-04 ENCOUNTER — Telehealth: Payer: Self-pay | Admitting: Neurology

## 2018-07-04 ENCOUNTER — Encounter: Payer: Self-pay | Admitting: Neurology

## 2018-07-04 ENCOUNTER — Ambulatory Visit (INDEPENDENT_AMBULATORY_CARE_PROVIDER_SITE_OTHER): Payer: Medicare Other | Admitting: Neurology

## 2018-07-04 VITALS — BP 145/83 | HR 87 | Temp 97.7°F | Ht 66.0 in | Wt 153.0 lb

## 2018-07-04 DIAGNOSIS — Z9181 History of falling: Secondary | ICD-10-CM | POA: Diagnosis not present

## 2018-07-04 DIAGNOSIS — G301 Alzheimer's disease with late onset: Secondary | ICD-10-CM

## 2018-07-04 DIAGNOSIS — R413 Other amnesia: Secondary | ICD-10-CM | POA: Diagnosis not present

## 2018-07-04 DIAGNOSIS — R269 Unspecified abnormalities of gait and mobility: Secondary | ICD-10-CM

## 2018-07-04 DIAGNOSIS — G8929 Other chronic pain: Secondary | ICD-10-CM | POA: Diagnosis not present

## 2018-07-04 DIAGNOSIS — F0280 Dementia in other diseases classified elsewhere without behavioral disturbance: Secondary | ICD-10-CM | POA: Insufficient documentation

## 2018-07-04 DIAGNOSIS — M25561 Pain in right knee: Secondary | ICD-10-CM | POA: Insufficient documentation

## 2018-07-04 MED ORDER — DONEPEZIL HCL 10 MG PO TABS: 10.0000 mg | ORAL_TABLET | Freq: Every day | ORAL | 4 refills | Status: DC

## 2018-07-04 NOTE — Telephone Encounter (Signed)
Medicare/tricare order sent to [facility]. No auth they will reach out to the patient to schedule.  

## 2018-07-04 NOTE — Progress Notes (Signed)
[facility]  PATIENT: Tammy [name] DOB: 04/26/1933  REFERRING DOCTOR OR PCP:  Arnette FeltsJanece Moore, FNP  SOURCE: Patient, notes from primary care, laboratory results, [place] cognitive assessment.  _________________________________   HISTORICAL  CHIEF COMPLAINT:  Chief Complaint  Patient presents with  . New Patient (Initial Visit)    RM [id number] with niece, Selena BattenKim. Internal referral for memory loss. Did MOCA today.     HISTORY OF PRESENT ILLNESS:  I had the pleasure of seeing your patient, Tammy [name], [facility] for neurologic consultation regarding her memory loss.  She is an 85 yo woman who has been noted by family to have issues with her memory.    She has not been handling her finances but feels she is able to do this.   She does not currently drive and does not do much shopping or cooking.    She was doing all of these tasks independently in 2018.   Her niece noticed changes in Tammy [name] about 18 months ago.   Initially, she noted that her aunt had trouble with finances and organization.   She had difficulty doing complex tasks.  She has not driven for about a year (sister or niece do most chores for her).   Her niece notes that her cognitive issues were first noted by family after Tammy [name] had a long hospital for diverticulitis and blood loss (had transfusions).      She has no imaging studies.  Labs 08/24/2017 showed normal TSH, B12 and low normal Vit D (33.2).     Although she has her own apartment, downsized two years ago from a home, she has lived with family the last 7 months.    She sleeps well at night getting 7 hours most nights.   She does not take naps.   She has soft snoring but no OSA signs.   She denies depression but has some anxiety.   Her gait is slightly worse over the last 2 years, not picking up her feet.   Bladder is fine.     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  Her mother who is 289 and living with  her and her sister has difficulty with memory and has sundowning.  REVIEW OF SYSTEMS: Constitutional: No fevers, chills, sweats, or change in appetite Eyes: No visual changes, double vision, eye pain Ear, nose and throat: No hearing loss, ear pain, nasal congestion, sore throat Cardiovascular: No chest pain, palpitations Respiratory: No shortness of breath at rest or with exertion.   No wheezes GastrointestinaI: No nausea, vomiting, diarrhea, abdominal pain, fecal incontinence Genitourinary: No dysuria, urinary retention or frequency.  No nocturia. Musculoskeletal: No neck pain, back pain Integumentary: No rash, pruritus, skin lesions Neurological: as above Psychiatric: No depression at this time.  No anxiety Endocrine: No palpitations, diaphoresis, change in appetite, change in weigh or increased thirst Hematologic/Lymphatic: No anemia, purpura, petechiae. Allergic/Immunologic: No itchy/runny eyes, nasal congestion, recent allergic reactions, rashes  ALLERGIES: Allergies  Allergen Reactions  . Advil [Ibuprofen] Itching  . Ciprofloxacin Other (See Comments)    REACTION: hives  . Zolpidem Tartrate Other (See Comments)    REACTION: trembling  . Unisom [Doxylamine] Swelling and Rash    HOME MEDICATIONS:  Current Outpatient Medications:  .  acetaminophen (TYLENOL) 500 MG tablet, Take 500 mg by mouth every 6 (six) hours as needed for mild pain., Disp: , Rfl:  .  Amlodipine-Olmesartan (AZOR PO), Take 1 tablet by mouth daily.,  Disp: , Rfl:  .  amlodipine-olmesartan (AZOR) 10-20 MG tablet, TAKE 1 TABLET BY MOUTH DAILY., Disp: 90 tablet, Rfl: 1 .  bimatoprost (LUMIGAN) 0.03 % ophthalmic solution, Place 1 drop into both eyes at bedtime., Disp: , Rfl:  .  calcium-vitamin D (OSCAL WITH D) 500-200 MG-UNIT per tablet, Take 1 tablet by mouth daily., Disp: , Rfl:  .  donepezil (ARICEPT) 10 MG tablet, Take 1 tablet (10 mg total) by mouth at bedtime., Disp: 90 tablet, Rfl: 4 .  omeprazole  (PRILOSEC) 20 MG capsule, Take 1 capsule (20 mg total) by mouth daily., Disp: 90 capsule, Rfl: 1  PAST MEDICAL HISTORY: Past Medical History:  Diagnosis Date  . Acid reflux   . Glaucoma   . High cholesterol   . Hypertension     PAST SURGICAL HISTORY: Past Surgical History:  Procedure Laterality Date  . ABDOMINAL HYSTERECTOMY    . COLONOSCOPY N/A 09/30/2015   Procedure: COLONOSCOPY;  Surgeon: Carol Ada, MD;  Location: [facility];  Service: Endoscopy;  Laterality: N/A;  . JOINT REPLACEMENT    . REVISION TOTAL HIP ARTHROPLASTY Bilateral    2011 and 2012    FAMILY HISTORY: Family History  Problem Relation Age of Onset  . Hypertension Mother   . Hypertension Sister   . Hypertension Brother   . Heart disease Brother     SOCIAL HISTORY:  Social History   Socioeconomic History  . Marital status: Widowed    Spouse name: Not on file  . Number of children: 0  . Years of education: some college  . Highest education level: Not on file  Occupational History  . 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 Financial resource strain: Not very hard  . Food insecurity:    Worry: Never true    Inability: Never true  . Transportation needs:    Medical: No    Non-medical: No  Tobacco Use  . Smoking status: Never Smoker  . Smokeless tobacco: Never Used  Substance and Sexual Activity  . Alcohol use: No  . Drug use: No  . Sexual activity: Not on file  Lifestyle  . Physical activity:    Days per week: Not on file    Minutes per session: Not on file  . Stress: Not on file  Relationships  . Social connections:    Talks on phone: Not on file    Gets together: Not on file    Attends religious service: Not on file    Active member of club or organization: Not on file    Attends meetings of clubs or organizations: Not on file    Relationship status: Not on file  . Intimate partner violence:    Fear of current or ex partner: Not on file    Emotionally abused: Not on file     Physically abused: Not on file    Forced sexual activity: Not on file  Other Topics Concern  . Not on file  Social History Narrative   Lives with mother who is 80 and sister currently (07/04/2018)   Right handed    Caffeine use: none     PHYSICAL EXAM  Vitals:   07/04/18 1003  BP: (!) 145/83  Pulse: 87  Temp: 97.7 F (36.5 C)  Weight: 153 lb (69.4 kg)  Height: 5\' 6"  (1.676 m)    Body mass index is 24.69 kg/m.   General: The patient is well-developed and well-nourished and in no acute distress  HEENT:  Head is Lloyd/AT.  Sclera are anicteric.  Funduscopic exam shows normal optic discs and retinal vessels.  Neck: No carotid bruits are noted.  The neck is nontender.  Cardiovascular: The heart has a regular rate and rhythm with a normal S1 and S2. There were no murmurs, gallops or rubs.    Skin: Extremities are without rash or  edema.   Neurologic Exam  Mental status: The patient is alert and oriented to name, place and year but not month or date.  She has reduced short-term memory (0/5) and reduced executive function, visual-spatial abilities and abstraction.[name].   Speech is normal.  Cranial nerves: Extraocular movements are full. Pupils are equal, round, and reactive to light and accomodation.   Facial symmetry is present. There is good facial sensation to soft touch bilaterally.Facial strength is normal.  Trapezius and sternocleidomastoid strength is normal. No dysarthria is noted.  The tongue is midline, and the patient has symmetric elevation of the soft palate. No obvious hearing deficits are noted.  Motor:  Muscle bulk is normal.   Tone is normal. Strength is  5 / 5 in all 4 extremities.   Sensory: Sensory testing is intact to pinprick, soft touch and vibration sensation in all 4 extremities.  Coordination: Cerebellar testing reveals good finger-nose-finger and heel-to-shin bilaterally.  Gait and station: Station is normal.   Gait is arthritic (right knee stiff). Tandem  gait is poor.   180 degree turn in 4 steps and mild retropulsion.. Romberg is negative.   Reflexes: Deep tendon reflexes are symmetric and normal bilaterally.   Plantar responses are flexor.    DIAGNOSTIC DATA (LABS, IMAGING, TESTING) - I reviewed patient records, labs, notes, testing and imaging myself where available.  Lab Results  Component Value Date   WBC 5.6 08/24/2017   HGB 13.8 08/24/2017   HCT 44 08/24/2017   MCV 82.4 10/19/2016   PLT 275 08/24/2017      Component Value Date/Time   NA 146 (H) 04/05/2018 1220   K 4.5 04/05/2018 1220   CL 105 04/05/2018 1220   CO2 24 04/05/2018 1220   GLUCOSE 84 04/05/2018 1220   GLUCOSE 123 (H) 10/17/2016 1110   BUN 14 04/05/2018 1220   CREATININE 0.84 04/05/2018 1220   CALCIUM 9.5 04/05/2018 1220   PROT 4.9 (L) 09/30/2015 0423   ALBUMIN 3.1 (L) 09/30/2015 0423   AST 21 08/24/2017 0952   ALT 15 08/24/2017 0952   ALKPHOS 108 08/24/2017 0952   BILITOT 0.7 09/30/2015 0423   GFRNONAA 65 04/05/2018 1220   GFRAA 75 04/05/2018 1220   Lab Results  Component Value Date   CHOL 183 04/05/2018   HDL 63 04/05/2018   LDLCALC 109 (H) 04/05/2018   TRIG 56 04/05/2018   CHOLHDL 2.9 04/05/2018   Lab Results  Component Value Date   HGBA1C 5.6 08/24/2017   Lab Results  Component Value Date   VITAMINB12 734 08/24/2017   Lab Results  Component Value Date   TSH 1.15 08/24/2017       ASSESSMENT AND PLAN  Late onset Alzheimer's disease without behavioral disturbance (HCC)  Memory loss - Plan: donepezil (ARICEPT) 10 MG tablet, MR BRAIN WO CONTRAST  Gait disturbance  History of falling  Chronic pain of right knee   In summary, Tammy [name] is an 85 year old woman with an 6743-month history of progressive memory and cognitive decline.  She scored 14/30 [facility] the [facility] cognitive assessment consistent with moderate dementia.   I discussed with her and her niece that Alzheimer's is  the most likely diagnosis.  We do need to check an MRI  of the brain without contrast to determine if there is any evidence of normal pressure hydrocephalus, hygromas, multiple infarcts or other change that could also lead to cognitive dysfunction.  This will also allow us to assess the extent of atrophy and the pattern.  Additionally, this will allow us to determine if there is a CNS cause of her mild gait dysfunction she has blood work a year ago showing normal B12 and TSH.  She is currently on donepezil 5 mg.  I will have her increase to 10 mg.  We had a discussion about her living arrangement.  She is currently living with family and that arrangement has worked out well.  I would be concerned about Tammy [name] living by herself.  She no longer drives.  She will return to see me in 6 months or sooner if there are new or worsening neurologic symptoms.  Thank you for asking me to see Tammy [name].  Please let me know if I can be of further assistance with her or other patients in the future.   Jeriel Vivanco A. Epimenio FootSater, MD, [facility],FAAN 07/04/2018, 12:47 PM Certified in Neurology, Clinical Neurophysiology, Sleep Medicine and Neuroimaging  [facility] [address], [address] [place], [place] [address] [phone]

## 2018-07-06 ENCOUNTER — Ambulatory Visit: Payer: Medicare Other

## 2018-07-06 DIAGNOSIS — R413 Other amnesia: Secondary | ICD-10-CM

## 2018-07-06 DIAGNOSIS — F0280 Dementia in other diseases classified elsewhere without behavioral disturbance: Secondary | ICD-10-CM

## 2018-07-06 NOTE — Patient Instructions (Signed)
Social Worker Visit Information  Goals we discussed today:  Goals Addressed            This Visit's Progress     Patient Stated   . COMPLETED: "I want to go back home" (pt-stated)       Current Barriers:  . Limited social support . Level of care concerns . ADL IADL limitations . Limited access to caregiver . Memory Deficits  Clinical Social Work Clinical Goal(s):  [name] Over the next 30 days, patient will work with SW to address needs related to iADL limitation concerns when the patient returns home  . Over the next 45 days, client will follow up with [facility] as directed by SW not met; re-established as a goal on 05/22/18 COMPLETED . New Goal 06/22/18: Over the next 60 days the patient and her POA will work with the [place] regarding caregiver needs.  CCM SW Interventions: Completed on 07/06/18: completed with [place] call to the patients POA [name] to assess outcome of neurology referral  Determined the patient has been diagnosed with Alzheimer's Disease and does not intend to return home as the neurologist felt that unless the patient had family close by to check on her twice daily this would not be a safe decision. The patient will remain living in the home with her mother and sister.    Patient Self Care Activities:  . Currently UNABLE TO independently care for self due to decressed skills with executive function including but not limited to managing money and decision making.  . The patient is UNABLE TO independently drive, perform iADL's without prompting, or administer medications  Please see past updates related to this goal by clicking on the "Past Updates" button in the selected goal        Other   . "My aunt's memory is getting worse"       Niece stated  Current Barriers:  [name] Knowledge Deficits related to disease process for Dementia and Self Health management  . Cognitive Deficits  Nurse Case Manager Clinical Goal(s):  [name] Over the  next 30 days, niece Joelene Millin [facility]) will verbalize understanding of the plan for patient to follow up with a Neurologist. Completed . Over the next 60 days, niece Joelene Millin will have an increased understanding of the disease process related to Dementia and knowing what to expect. Completed 07/06/18 . Over the next 90 days, niece Joelene Millin will have a plan in place for long term care of the patient.    CCM SW Interventions:  Completed 07/06/18 with [place] call to the patients niece and POA [name] to assess outcome of patients recent neurologist visit  Determined the patient has been diagnosed with Alzheimer's Disease without behavioral disturbance. The patients Aricept has been increased from 5 mg to 10 mg and a MRI w/o contrast has been ordered   Assessed for understanding of diagnosis - [name] reports the family is aware of Alzheimer's disease progression as her grand-father was diagnosed with this disease as well. [name] has plans to bring furniture from the patients home to her current living situation to allow the patient to feel more comfortable  Initiated discussion surrounding VA benefits and difference of patient being placed versus receiving in home benefits via aide and attendance (application previously submitted). At this time it seems the patient is not ready for placement. Call cut short due to [name] receiving an inbound call from an urgent care  provider where her mother was currently receiving treatment.   CCM RN CM Interventions:  Completed on 06/22/18: completed call with niece and POA Dorann Ou   Received update from embedded [employer] with an update regarding niece reports patient is having worsening memory deficit with behavior changes  Placed outbound call to niece and [place]  Comprehensive Neuro assessment completed; assessed for other possible causes for cognitive changes including symptoms  suggestive of infection -- niece denies  Verbal education provided related to other potential causes for cognitive changes including infection  Reviewed s/s of infection and when to call the provider to report symptoms early for early diagnosis and treatment  Discussed niece's plan to take patient to her home at which time she will plan to stay with the patient in her home with anticipation patient's symptoms will improve  Instructed niece to call provider [name], FNP and or the CCM team to report persistent or worsening symptoms and or to schedule a virtual visit-niece verbalizes understanding  Discussed reported symptoms may be related to the dementia disease process  Discussed plans for ongoing CCM follow up for chronic disease management and care coordination   Discussed upcoming MD follow up appts including new patient Neuro appt date/time, discussed next AWV scheduled with PCP [name], FNP date/time  Confirmed niece has RN CM contact # and discussed nurse availability  Scheduled a CCM follow up call following Neuro follow up  Patient update forwarded to [place] and provider [name], FNP   Patient Self Care Activities:  . Currently UNABLE TO independently perform [place]  (sister [name] and niece Joelene Millin are assisting with all care needs and transportation)  Please see past updates related to this goal by clicking on the "Past Updates" button in the selected goal            Follow Up Plan: SW will follow up with patient by phone over the next 7 days  Daneen Schick, BSW, CDP Social Worker, Certified Dementia Practitioner [place] [facility] [place] [facility] [phone]

## 2018-07-06 NOTE — Chronic Care Management (AMB) (Signed)
Chronic Care Management     Social Work Follow Up Note  07/06/2018 Name: Tammy [name] MRN: [id number] DOB: February 04, 1933  Tammy [name] is a 85 y.o. year old female who is a primary care patient of [name], [facility]. The CCM team was consulted for assistance with Level of Care Concerns.   Review of patient status, including review of consultants reports, other relevant assessments, and collaboration with appropriate care team members and the patient's provider was performed as part of comprehensive patient evaluation and provision of chronic care management services.     Care coordination call completed with the patients POA and niece Tammy [name].  Goals Addressed            This Visit's Progress     Patient Stated   . COMPLETED: "I want to go back home" (pt-stated)       Current Barriers:  . Limited social support . Level of care concerns . ADL IADL limitations . Limited access to caregiver . Memory Deficits  Clinical Social Work Clinical Goal(s):  [name] Over the next 30 days, patient will work with SW to address needs related to iADL limitation concerns when the patient returns home  . Over the next 45 days, client will follow up with [facility] as directed by SW not met; re-established as a goal on 05/22/18 COMPLETED . New Goal 06/22/18: Over the next 60 days the patient and her POA will work with the [place] regarding caregiver needs.  CCM SW Interventions: Completed on 07/06/18: completed with [place] call to the patients POA Tammy [name] to assess outcome of neurology referral  Determined the patient has been diagnosed with Alzheimer's Disease and does not intend to return home as the neurologist felt that unless the patient had family close by to check on her twice daily this would not be a safe decision. The patient will remain living in the home with her mother and sister.    Patient Self Care Activities:  . Currently UNABLE TO independently  care for self due to decressed skills with executive function including but not limited to managing money and decision making.  . The patient is UNABLE TO independently drive, perform iADL's without prompting, or administer medications  Please see past updates related to this goal by clicking on the "Past Updates" button in the selected goal        Other   . "My aunt's memory is getting worse"       Niece stated  Current Barriers:  [name] Knowledge Deficits related to disease process for Dementia and Self Health management  . Cognitive Deficits  Nurse Case Manager Clinical Goal(s):  [name] Over the next 30 days, niece Tammy [name] [facility]) will verbalize understanding of the plan for patient to follow up with a Neurologist. Completed . Over the next 60 days, niece Tammy [name] will have an increased understanding of the disease process related to Dementia and knowing what to expect. Completed 07/06/18 . Over the next 90 days, niece Tammy [name] will have a plan in place for long term care of the patient.    CCM SW Interventions:  Completed 07/06/18 with [place] call to the patients niece and POA Tammy [name] to assess outcome of patients recent neurologist visit  Determined the patient has been diagnosed with Alzheimer's Disease without behavioral disturbance. The patients Aricept has been increased from 5 mg to 10 mg and a MRI w/o contrast has been ordered  Assessed for understanding of diagnosis - Tammy [name] reports the family is aware of Alzheimer's disease progression as her grand-father was diagnosed with this disease as well. Tammy [name] has plans to bring furniture from the patients home to her current living situation to allow the patient to feel more comfortable  Initiated discussion surrounding VA benefits and difference of patient being placed versus receiving in home benefits via aide and attendance (application previously submitted). At this time it seems the patient is not  ready for placement. Call cut short due to Tammy [name] receiving an inbound call from an urgent care provider where her mother was currently receiving treatment.   CCM RN CM Interventions:  Completed on 06/22/18: completed call with niece and POA Tammy [name]   Received update from embedded [employer] with an update regarding niece reports patient is having worsening memory deficit with behavior changes  Placed outbound call to niece and [place]  Comprehensive Neuro assessment completed; assessed for other possible causes for cognitive changes including symptoms suggestive of infection -- niece denies  Verbal education provided related to other potential causes for cognitive changes including infection  Reviewed s/s of infection and when to call the provider to report symptoms early for early diagnosis and treatment  Discussed niece's plan to take patient to her home at which time she will plan to stay with the patient in her home with anticipation patient's symptoms will improve  Instructed niece to call provider [name], FNP and or the CCM team to report persistent or worsening symptoms and or to schedule a virtual visit-niece verbalizes understanding  Discussed reported symptoms may be related to the dementia disease process  Discussed plans for ongoing CCM follow up for chronic disease management and care coordination   Discussed upcoming MD follow up appts including new patient Neuro appt date/time, discussed next AWV scheduled with PCP [name], FNP date/time  Confirmed niece has RN CM contact # and discussed nurse availability  Scheduled a CCM follow up call following Neuro follow up  Patient update forwarded to Tammy [name] and provider [name], FNP   Patient Self Care Activities:  . Currently UNABLE TO independently perform [place]  (sister Tammy [name] and niece Tammy [name] are assisting with all care needs and transportation)   Please see past updates related to this goal by clicking on the "Past Updates" button in the selected goal           Follow Up Plan: SW will follow up with patient by phone over the next 7 days   Daneen Schick, BSW, CDP Social Worker, Certified Dementia Practitioner [place] [facility] [place] [facility] [phone]  Total time spent performing care coordination and/or care management activities with the patient by phone or face to face = 18 minutes.

## 2018-07-13 ENCOUNTER — Ambulatory Visit (INDEPENDENT_AMBULATORY_CARE_PROVIDER_SITE_OTHER): Payer: Medicare Other

## 2018-07-13 ENCOUNTER — Telehealth: Payer: Self-pay

## 2018-07-13 ENCOUNTER — Ambulatory Visit: Payer: Self-pay

## 2018-07-13 DIAGNOSIS — F0280 Dementia in other diseases classified elsewhere without behavioral disturbance: Secondary | ICD-10-CM | POA: Diagnosis not present

## 2018-07-13 DIAGNOSIS — G301 Alzheimer's disease with late onset: Secondary | ICD-10-CM | POA: Diagnosis not present

## 2018-07-13 DIAGNOSIS — R413 Other amnesia: Secondary | ICD-10-CM

## 2018-07-13 DIAGNOSIS — I10 Essential (primary) hypertension: Secondary | ICD-10-CM

## 2018-07-13 NOTE — Patient Instructions (Signed)
Social Worker Visit Information  Goals we discussed today:  Goals    . "My aunt's memory is getting worse     Niece stated  Current Barriers:  [name] Knowledge Deficits related to disease process for Dementia and Self Health management  . Cognitive Deficits  Nurse Case Manager Clinical Goal(s):  [name] Over the next 30 days, niece Joelene Millin [facility]) will verbalize understanding of the plan for patient to follow up with a Neurologist. Completed . Over the next 60 days, niece Joelene Millin will have an increased understanding of the disease process related to Dementia and knowing what to expect. Completed 07/06/18 . Over the next 90 days, niece Joelene Millin will have a plan in place for long term care of the patient.    CCM SW Interventions:  Completed 07/13/18 with [place] call to the patients niece and POA [name] to review VA benefits for long-term care placement  Assessed for patients desire for placement - [name] reports the patient continues to reside with her sister and mother. The patient has mentioned she may want to move into a retirement community in the future  Explained to [name] that although the patient may be entitled to some financial assistance, the VA benefit will most likely not cover the entire cost of placement  Advised [name] to contact the [facility] [phone]) to discuss patient desire for placement and request assistance with application   CCM RN CM Interventions:  Completed on 06/22/18: completed call with niece and POA Dorann Ou   Received update from embedded [employer] with an update regarding niece reports patient is having worsening memory deficit with behavior changes  Placed outbound call to niece and [place]  Comprehensive Neuro assessment completed; assessed for other possible causes for cognitive changes including symptoms suggestive of infection -- niece denies  Verbal education provided related  to other potential causes for cognitive changes including infection  Reviewed s/s of infection and when to call the provider to report symptoms early for early diagnosis and treatment  Discussed niece's plan to take patient to her home at which time she will plan to stay with the patient in her home with anticipation patient's symptoms will improve  Instructed niece to call provider [name], FNP and or the CCM team to report persistent or worsening symptoms and or to schedule a virtual visit-niece verbalizes understanding  Discussed reported symptoms may be related to the dementia disease process  Discussed plans for ongoing CCM follow up for chronic disease management and care coordination   Discussed upcoming MD follow up appts including new patient Neuro appt date/time, discussed next AWV scheduled with PCP [name], FNP date/time  Confirmed niece has RN CM contact # and discussed nurse availability  Scheduled a CCM follow up call following Neuro follow up  Patient update forwarded to [place] and provider [name], FNP   Patient Self Care Activities:  . Currently UNABLE TO independently perform [place]  (sister [name] and niece Joelene Millin are assisting with all care needs and transportation)  Please see past updates related to this goal by clicking on the "Past Updates" button in the selected goal           Materials Provided: Verbal education about VA programs  provided by phone  Follow Up Plan: SW will follow up with patient by phone over the next 4-5 weeks   Daneen Schick, BSW, CDP Social Worker, Teacher, adult education /  [facility] Care Management [phone]

## 2018-07-13 NOTE — Chronic Care Management (AMB) (Signed)
  Chronic Care Management   Follow Up Note   07/13/2018 Name: Tammy [name] MRN: [id number] DOB: [birth year]  Referred by: [name], FNP Reason for referral : Chronic Care Management (CCM RNCM Case Review and Collaboration )   Tammy [name] is a 85 y.o. year old female who is a primary care patient of [name], [place]. The CCM team was consulted for assistance with chronic disease management and care coordination needs.    Review of patient status, including review of consultants reports, relevant laboratory and other test results, and collaboration with appropriate care team members and the patient's provider was performed as part of comprehensive patient evaluation and provision of chronic care management services.    Received patient update from embedded [place] stating [name] was admitted into the hospital about a week ago per patient's niece Tammy [name]. She took [name] into an Urgent Care Ctr for shortness of breath at which time the patient was transferred to the hospital for admission (facility unknown at this time).   CCM RN CM will plan to outreach to [name]'s niece Tammy [name] in 3-5 business days.    Barb Merino, RN, BSN, CCM Care Management Coordinator [place] [facility]  Direct Phone: [phone]

## 2018-07-13 NOTE — Chronic Care Management (AMB) (Signed)
Chronic Care Management     Social Work Follow Up Note  07/13/2018 Name: Tammy [name] MRN: [id number] DOB: 02/09/1933  Tammy [name] is a 85 y.o. year old female who is a primary care patient of Tammy [name], [place]. The CCM team was consulted for assistance with [employer].   Review of patient status, including review of consultants reports, other relevant assessments, and collaboration with appropriate care team members and the patient's provider was performed as part of comprehensive patient evaluation and provision of chronic care management services.     Goals    . "My aunt's memory is getting worse     Niece stated  Current Barriers:  [name] Knowledge Deficits related to disease process for Dementia and Self Health management  . Cognitive Deficits  Nurse Case Manager Clinical Goal(s):  [name] Over the next 30 days, niece Tammy [name] [facility]) will verbalize understanding of the plan for patient to follow up with a Neurologist. Completed . Over the next 60 days, niece Tammy [name] will have an increased understanding of the disease process related to Dementia and knowing what to expect. Completed 07/06/18 . Over the next 90 days, niece Tammy [name] will have a plan in place for long term care of the patient.    CCM SW Interventions:  Completed 07/13/18 with Tammy [name] call to the patients niece and POA Tammy [name] to review VA benefits for long-term care placement  Assessed for patients desire for placement - [name] reports the patient continues to reside with her sister and mother. The patient has mentioned she may want to move into a retirement community in the future  Explained to [name] that although the patient may be entitled to some financial assistance, the VA benefit will most likely not cover the entire cost of placement  Advised [name] to contact the [facility] [phone]) to discuss patient desire for placement and request assistance with  application   CCM RN CM Interventions:  Completed on 06/22/18: completed call with niece and POA Tammy [name]   Received update from embedded [employer] with an update regarding niece reports patient is having worsening memory deficit with behavior changes  Placed outbound call to niece and [place]  Comprehensive Neuro assessment completed; assessed for other possible causes for cognitive changes including symptoms suggestive of infection -- niece denies  Verbal education provided related to other potential causes for cognitive changes including infection  Reviewed s/s of infection and when to call the provider to report symptoms early for early diagnosis and treatment  Discussed niece's plan to take patient to her home at which time she will plan to stay with the patient in her home with anticipation patient's symptoms will improve  Instructed niece to call provider Tammy Brine, FNP and or the CCM team to report persistent or worsening symptoms and or to schedule a virtual visit-niece verbalizes understanding  Discussed reported symptoms may be related to the dementia disease process  Discussed plans for ongoing CCM follow up for chronic disease management and care coordination   Discussed upcoming MD follow up appts including new patient Neuro appt date/time, discussed next AWV scheduled with PCP Tammy Brine, FNP date/time  Confirmed niece has RN CM contact # and discussed nurse availability  Scheduled a CCM follow up call following Neuro follow up  Patient update forwarded to Tammy [name] and provider Tammy Brine, FNP   Patient Self Care Activities:  . Currently UNABLE TO independently perform Self  Health Care  (sister Tammy [name] and niece Tammy [name] are assisting with all care needs and transportation)  Please see past updates related to this goal by clicking on the "Past Updates" button in the selected goal           Follow Up Plan: SW will  follow up with patient by phone over the next 4-5 weeks.   Tammy NgoKendra Janah [name], BSW, CDP Social Worker, Certified Dementia Practitioner [facility] / [facility] Care Management [phone]  Total time spent performing care coordination and/or care management activities with the patient by phone or face to face = 23 minutes.

## 2018-07-19 ENCOUNTER — Telehealth: Payer: Self-pay

## 2018-08-04 ENCOUNTER — Telehealth: Payer: Self-pay

## 2018-08-30 ENCOUNTER — Ambulatory Visit (INDEPENDENT_AMBULATORY_CARE_PROVIDER_SITE_OTHER): Payer: Medicare Other | Admitting: Nurse Practitioner

## 2018-08-30 ENCOUNTER — Ambulatory Visit (INDEPENDENT_AMBULATORY_CARE_PROVIDER_SITE_OTHER): Payer: Medicare Other

## 2018-08-30 ENCOUNTER — Encounter: Payer: Self-pay | Admitting: Nurse Practitioner

## 2018-08-30 ENCOUNTER — Other Ambulatory Visit: Payer: Self-pay

## 2018-08-30 VITALS — BP 130/82 | HR 66 | Temp 98.5°F | Ht 66.6 in | Wt 149.0 lb

## 2018-08-30 VITALS — BP 130/82 | HR 66 | Temp 98.5°F | Ht 66.6 in | Wt 149.6 lb

## 2018-08-30 DIAGNOSIS — E782 Mixed hyperlipidemia: Secondary | ICD-10-CM

## 2018-08-30 DIAGNOSIS — G8929 Other chronic pain: Secondary | ICD-10-CM

## 2018-08-30 DIAGNOSIS — Z0000 Encounter for general adult medical examination without abnormal findings: Secondary | ICD-10-CM

## 2018-08-30 DIAGNOSIS — I10 Essential (primary) hypertension: Secondary | ICD-10-CM

## 2018-08-30 DIAGNOSIS — M25561 Pain in right knee: Secondary | ICD-10-CM | POA: Diagnosis not present

## 2018-08-30 DIAGNOSIS — M79674 Pain in right toe(s): Secondary | ICD-10-CM

## 2018-08-30 DIAGNOSIS — Z23 Encounter for immunization: Secondary | ICD-10-CM

## 2018-08-30 LAB — POCT URINALYSIS DIPSTICK
Bilirubin, UA: NEGATIVE
Glucose, UA: NEGATIVE
Ketones, UA: NEGATIVE
Nitrite, UA: NEGATIVE
Protein, UA: POSITIVE — AB
Spec Grav, UA: 1.025 (ref 1.010–1.025)
Urobilinogen, UA: 0.2 E.U./dL
pH, UA: 7 (ref 5.0–8.0)

## 2018-08-30 MED ORDER — DICLOFENAC SODIUM 1 % TD GEL: 2.0000 g | Freq: Four times a day (QID) | TRANSDERMAL | 2 refills | Status: DC

## 2018-08-30 MED ORDER — PREVNAR 13 IM SUSP: 0.5000 mL | INTRAMUSCULAR | 0 refills | Status: AC

## 2018-08-30 NOTE — Patient Instructions (Signed)
Tammy [name] , Thank you for taking time to come for your Medicare Wellness Visit. I appreciate your ongoing commitment to your health goals. Please review the following plan we discussed and let me know if I can assist you in the future.   Screening recommendations/referrals: Colonoscopy: not required Mammogram: not required Bone Density: 08/2016 Recommended yearly ophthalmology/optometry visit for glaucoma screening and checkup Recommended yearly dental visit for hygiene and checkup  Vaccinations: Influenza vaccine: 10/2017 Pneumococcal vaccine: sent to pharmacy Tdap vaccine: 02/2009 Shingles vaccine: discussed    Advanced directives: Advance directive discussed with you today. Even though you declined this today please call our office should you change your mind and we can give you the proper paperwork for you to fill out.   Conditions/risks identified: Dementia  Next appointment: 08/30/2018   Preventive Care 83 Years and Older, Female Preventive care refers to lifestyle choices and visits with your health care provider that can promote health and wellness. What does preventive care include?  A yearly physical exam. This is also called an annual well check.  Dental exams once or twice a year.  Routine eye exams. Ask your health care provider how often you should have your eyes checked.  Personal lifestyle choices, including:  Daily care of your teeth and gums.  Regular physical activity.  Eating a healthy diet.  Avoiding tobacco and drug use.  Limiting alcohol use.  Practicing safe sex.  Taking low-dose aspirin every day.  Taking vitamin and mineral supplements as recommended by your health care provider. What happens during an annual well check? The services and screenings done by your health care provider during your annual well check will depend on your age, overall health, lifestyle risk factors, and family history of disease. Counseling  Your health care  provider may ask you questions about your:  Alcohol use.  Tobacco use.  Drug use.  Emotional well-being.  Home and relationship well-being.  Sexual activity.  Eating habits.  History of falls.  Memory and ability to understand (cognition).  Work and work Statistician.  Reproductive health. Screening  You may have the following tests or measurements:  Height, weight, and BMI.  Blood pressure.  Lipid and cholesterol levels. These may be checked every 5 years, or more frequently if you are over 61 years old.  Skin check.  Lung cancer screening. You may have this screening every year starting at age 62 if you have a 30-pack-year history of smoking and currently smoke or have quit within the past 15 years.  Fecal occult blood test (FOBT) of the stool. You may have this test every year starting at age 3.  Flexible sigmoidoscopy or colonoscopy. You may have a sigmoidoscopy every 5 years or a colonoscopy every 10 years starting at age 71.  Hepatitis C blood test.  Hepatitis B blood test.  Sexually transmitted disease (STD) testing.  Diabetes screening. This is done by checking your blood sugar (glucose) after you have not eaten for a while (fasting). You may have this done every 1-3 years.  Bone density scan. This is done to screen for osteoporosis. You may have this done starting at age 72.  Mammogram. This may be done every 1-2 years. Talk to your health care provider about how often you should have regular mammograms. Talk with your health care provider about your test results, treatment options, and if necessary, the need for more tests. Vaccines  Your health care provider may recommend certain vaccines, such as:  Influenza vaccine. This is  recommended every year.  Tetanus, diphtheria, and acellular pertussis (Tdap, Td) vaccine. You may need a Td booster every 10 years.  Zoster vaccine. You may need this after age 61.  Pneumococcal 13-valent conjugate (PCV13)  vaccine. One dose is recommended after age 37.  Pneumococcal polysaccharide (PPSV23) vaccine. One dose is recommended after age 42. Talk to your health care provider about which screenings and vaccines you need and how often you need them. This information is not intended to replace advice given to you by your health care provider. Make sure you discuss any questions you have with your health care provider. Document Released: 02/07/2015 Document Revised: 10/01/2015 Document Reviewed: 11/12/2014 Elsevier Interactive Patient Education  2017 [place] Prevention in the Home Falls can cause injuries. They can happen to people of all ages. There are many things you can do to make your home safe and to help prevent falls. What can I do on the outside of my home?  Regularly fix the edges of walkways and driveways and fix any cracks.  Remove anything that might make you trip as you walk through a door, such as a raised step or threshold.  Trim any bushes or trees on the path to your home.  Use bright outdoor lighting.  Clear any walking paths of anything that might make someone trip, such as rocks or tools.  Regularly check to see if handrails are loose or broken. Make sure that both sides of any steps have handrails.  Any raised decks and porches should have guardrails on the edges.  Have any leaves, snow, or ice cleared regularly.  Use sand or salt on walking paths during winter.  Clean up any spills in your garage right away. This includes oil or grease spills. What can I do in the bathroom?  Use night lights.  Install grab bars by the toilet and in the tub and shower. Do not use towel bars as grab bars.  Use non-skid mats or decals in the tub or shower.  If you need to sit down in the shower, use a plastic, non-slip stool.  Keep the floor dry. Clean up any water that spills on the floor as soon as it happens.  Remove soap buildup in the tub or shower regularly.   Attach bath mats securely with double-sided non-slip rug tape.  Do not have throw rugs and other things on the floor that can make you trip. What can I do in the bedroom?  Use night lights.  Make sure that you have a light by your bed that is easy to reach.  Do not use any sheets or blankets that are too big for your bed. They should not hang down onto the floor.  Have a firm chair that has side arms. You can use this for support while you get dressed.  Do not have throw rugs and other things on the floor that can make you trip. What can I do in the kitchen?  Clean up any spills right away.  Avoid walking on wet floors.  Keep items that you use a lot in easy-to-reach places.  If you need to reach something above you, use a strong step stool that has a grab bar.  Keep electrical cords out of the way.  Do not use floor polish or wax that makes floors slippery. If you must use wax, use non-skid floor wax.  Do not have throw rugs and other things on the floor that can make you trip.  What can I do with my stairs?  Do not leave any items on the stairs.  Make sure that there are handrails on both sides of the stairs and use them. Fix handrails that are broken or loose. Make sure that handrails are as long as the stairways.  Check any carpeting to make sure that it is firmly attached to the stairs. Fix any carpet that is loose or worn.  Avoid having throw rugs at the top or bottom of the stairs. If you do have throw rugs, attach them to the floor with carpet tape.  Make sure that you have a light switch at the top of the stairs and the bottom of the stairs. If you do not have them, ask someone to add them for you. What else can I do to help prevent falls?  Wear shoes that:  Do not have high heels.  Have rubber bottoms.  Are comfortable and fit you well.  Are closed at the toe. Do not wear sandals.  If you use a stepladder:  Make sure that it is fully opened. Do not climb  a closed stepladder.  Make sure that both sides of the stepladder are locked into place.  Ask someone to hold it for you, if possible.  Clearly mark and make sure that you can see:  Any grab bars or handrails.  First and last steps.  Where the edge of each step is.  Use tools that help you move around (mobility aids) if they are needed. These include:  Canes.  Walkers.  Scooters.  Crutches.  Turn on the lights when you go into a dark area. Replace any light bulbs as soon as they burn out.  Set up your furniture so you have a clear path. Avoid moving your furniture around.  If any of your floors are uneven, fix them.  If there are any pets around you, be aware of where they are.  Review your medicines with your doctor. Some medicines can make you feel dizzy. This can increase your chance of falling. Ask your doctor what other things that you can do to help prevent falls. This information is not intended to replace advice given to you by your health care provider. Make sure you discuss any questions you have with your health care provider. Document Released: 11/07/2008 Document Revised: 06/19/2015 Document Reviewed: 02/15/2014 Elsevier Interactive Patient Education  2017 [employer].

## 2018-08-30 NOTE — Progress Notes (Signed)
Subjective:   Tammy [name] is a 85 y.o. female who presents for Medicare Annual (Subsequent) preventive examination.  Review of Systems:  n/a Cardiac Risk Factors include: advanced age (>1955men, 85>65 women);dyslipidemia;hypertension     Objective:     Vitals: BP 130/82 (BP Location: Left Arm, Patient Position: Sitting)   Pulse 66   Temp 98.5 F (36.9 C) (Oral)   Ht 5' 6.6" (1.692 m)   Wt 149 lb (67.6 kg)   BMI 23.62 kg/m   Body mass index is 23.62 kg/m.  Advanced Directives 08/30/2018 10/17/2016 10/17/2016 04/12/2016 09/30/2015 09/26/2015 09/25/2015  Does Patient Have a Medical Advance Directive? No No No No No No No  Would patient like information on creating a medical advance directive? - No - Patient declined No - Patient declined - - - No - patient declined information    Tobacco Social History   Tobacco Use  Smoking Status Never Smoker  Smokeless Tobacco Never Used     Counseling given: Not Answered   Clinical Intake:  Pre-visit preparation completed: Yes  Pain : 0-10 Pain Score: 5  Pain Type: Acute pain Pain Location: Knee Pain Orientation: Right Pain Radiating Towards: none Pain Descriptors / Indicators: Aching Pain Onset: More than a month ago Pain Frequency: Intermittent Pain Relieving Factors: knee brace helps  Pain Relieving Factors: knee brace helps  Nutritional Status: BMI of 19-24  Normal Nutritional Risks: None Diabetes: No  How often do you need to have someone help you when you read instructions, pamphlets, or other written materials from your doctor or pharmacy?: 1 - Never What is the last grade level you completed in school?: some college  Interpreter Needed?: No  Information entered by :: NAllen  Past Medical History:  Diagnosis Date  . Acid reflux   . Glaucoma   . High cholesterol   . Hypertension    Past Surgical History:  Procedure Laterality Date  . ABDOMINAL HYSTERECTOMY    . COLONOSCOPY N/A 09/30/2015   Procedure:  COLONOSCOPY;  Surgeon: Jeani HawkingPatrick Hung, MD;  Location: [facility];  Service: Endoscopy;  Laterality: N/A;  . JOINT REPLACEMENT    . REVISION TOTAL HIP ARTHROPLASTY Bilateral    2011 and 2012   Family History  Problem Relation Age of Onset  . Hypertension Mother   . Hypertension Sister   . Hypertension Brother   . Heart disease Brother    Social History   Socioeconomic History  . Marital status: Widowed    Spouse name: Not on file  . Number of children: 0  . Years of education: some college  . Highest education level: Not on file  Occupational History  . 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 Financial resource strain: Not very hard  . Food insecurity    Worry: Never true    Inability: Never true  . Transportation needs    Medical: No    Non-medical: No  Tobacco Use  . Smoking status: Never Smoker  . Smokeless tobacco: Never Used  Substance and Sexual Activity  . Alcohol use: No  . Drug use: No  . Sexual activity: Not Currently  Lifestyle  . Physical activity    Days per week: 0 days    Minutes per session: 0 min  . Stress: Not at all  Relationships  . Social Musicianconnections    Talks on phone: Not on file    Gets together: Not on file    Attends religious service: Not on file  Active member of club or organization: Not on file    Attends meetings of clubs or organizations: Not on file    Relationship status: Not on file  Other Topics Concern  . Not on file  Social History Narrative   Lives with mother who is [id number] and sister currently (07/04/2018)   Right handed    Caffeine use: none    Outpatient Encounter Medications as of 08/30/2018  Medication Sig  . acetaminophen (TYLENOL) 500 MG tablet Take 500 mg by mouth every 6 (six) hours as needed for mild pain.  [name]. amlodipine-olmesartan (AZOR) 10-20 MG tablet TAKE 1 TABLET BY MOUTH DAILY.  . bimatoprost (LUMIGAN) 0.03 % ophthalmic solution Place 1 drop into both eyes at bedtime.  . calcium-vitamin D (OSCAL WITH D) 500-200  MG-UNIT per tablet Take 1 tablet by mouth daily.  [name]. donepezil (ARICEPT) 10 MG tablet Take 1 tablet (10 mg total) by mouth at bedtime.  [name]. omeprazole (PRILOSEC) 20 MG capsule Take 1 capsule (20 mg total) by mouth daily.   No facility-administered encounter medications on file as of 08/30/2018.     Activities of Daily Living In your present state of health, do you have any difficulty performing the following activities: 08/30/2018 04/07/2018  Hearing? N N  Vision? N N  Difficulty concentrating or making decisions? Y Y  Comment a little -  Walking or climbing stairs? N Y  Dressing or bathing? N N  Doing errands, shopping? N Y  Quarry managerreparing Food and eating ? N Y  Using the Toilet? N N  In the past six months, have you accidently leaked urine? N N  Do you have problems with loss of bowel control? N N  Managing your Medications? N Y  Managing your Finances? N Y  Housekeeping or managing your Housekeeping? N Y  Some recent data might be hidden    Patient Care Team: Arnette FeltsMoore, Janece, FNP as PCP - General (General Practice) Bevelyn NgoHumble, Kendra as Social Worker Little, Karma LewAngel L, RN as Case Manager    Assessment:   This is a routine wellness examination for Tammy [name].  Exercise Activities and Dietary recommendations Current Exercise Habits: The patient does not participate in regular exercise at present  Goals    . "My aunt's memory is getting worse     Niece stated  Current Barriers:  [name]. Knowledge Deficits related to disease process for Dementia and Self Health management  . Cognitive Deficits  Nurse Case Manager Clinical Goal(s):  [name]. Over the next 30 days, niece Tammy BradfordKimberly [facility](POA) will verbalize understanding of the plan for patient to follow up with a Neurologist. Completed . Over the next 60 days, niece Tammy BradfordKimberly will have an increased understanding of the disease process related to Dementia and knowing what to expect. Completed 07/06/18 . Over the next 90 days, niece Tammy BradfordKimberly will have a plan in place  for long term care of the patient.    CCM SW Interventions:  Completed 07/13/18 with POA Tammy [name]  Outbound call to the patients niece and POA Tammy [name] to review VA benefits for long-term care placement  Assessed for patients desire for placement - Mrs. Lin GivensJeffries reports the patient continues to reside with her sister and mother. The patient has mentioned she may want to move into a retirement community in the future  Explained to Mrs. Lin GivensJeffries that although the patient may be entitled to some financial assistance, the VA benefit will most likely not cover the entire cost of placement  Advised Mrs. Lin GivensJeffries  to contact the [facility] [phone]) to discuss patient desire for placement and request assistance with application   CCM RN CM Interventions:  Completed on 06/22/18: completed call with niece and POA Tammy [name]   Received update from embedded [employer] Kendra Humble with an update regarding niece reports patient is having worsening memory deficit with behavior changes  Placed outbound call to niece and POA Tammy [name]  Comprehensive Neuro assessment completed; assessed for other possible causes for cognitive changes including symptoms suggestive of infection -- niece denies  Verbal education provided related to other potential causes for cognitive changes including infection  Reviewed s/s of infection and when to call the provider to report symptoms early for early diagnosis and treatment  Discussed niece's plan to take patient to her home at which time she will plan to stay with the patient in her home with anticipation patient's symptoms will improve  Instructed niece to call provider Arnette FeltsJanece Moore, FNP and or the CCM team to report persistent or worsening symptoms and or to schedule a virtual visit-niece verbalizes understanding  Discussed reported symptoms may be related to the dementia disease process  Discussed plans for ongoing CCM follow up for chronic disease  management and care coordination   Discussed upcoming MD follow up appts including new patient Neuro appt date/time, discussed next AWV scheduled with PCP Arnette FeltsJanece Moore, FNP date/time  Confirmed niece has RN CM contact # and discussed nurse availability  Scheduled a CCM follow up call following Neuro follow up  Patient update forwarded to BSW Bevelyn NgoKendra Humble and provider Arnette FeltsJanece Moore, FNP   Patient Self Care Activities:  . Currently UNABLE TO independently perform Self Health Care  (sister Tammy [name] and niece Tammy BradfordKimberly are assisting with all care needs and transportation)  Please see past updates related to this goal by clicking on the "Past Updates" button in the selected goal          Fall Risk Fall Risk  08/30/2018 08/30/2018 04/07/2018 12/01/2017 08/16/2016  Falls in the past year? 1 0 1 1 Yes  Comment - - - - Emmi Telephone Survey: data to providers prior to load  Number falls in past yr: (No Data) - 1 1 1   Comment tripped over the curb - - - Emmi Telephone Survey Actual Response = 1  Injury with Fall? - - 0 0 Yes  Risk for fall due to : History of fall(s);Medication side effect - History of fall(s);Impaired balance/gait;Other (Comment) - -  Risk for fall due to: Comment - - dementia - -  Follow up Falls evaluation completed;Education provided;Falls prevention discussed - Falls prevention discussed;Education provided - -   Is the patient's home free of loose throw rugs in walkways, pet beds, electrical cords, etc?   yes      Grab bars in the bathroom? yes      Handrails on the stairs?   n/a      Adequate lighting?   yes  Timed Get Up and Go performed: n/a  Depression Screen PHQ 2/9 Scores 08/30/2018 08/30/2018 04/07/2018 12/01/2017  PHQ - 2 Score 0 0 0 0  PHQ- 9 Score 0 - - -     Cognitive Function   Montreal Cognitive Assessment  07/04/2018  Visuospatial/ Executive (0/5) 2  Naming (0/3) 2  Attention: Read list of digits (0/2) 2  Attention: Read list of letters (0/1) 1  Attention:  Serial 7 subtraction starting at 100 (0/3) 2  Language: Repeat phrase (0/2) 2  Language : Fluency (0/1) 0  Abstraction (0/2) 0  Delayed Recall (0/5) 0  Orientation (0/6) 3  Total 14  Adjusted Score (based on education) 14   6CIT Screen 08/30/2018  What Year? 0 points  What month? 3 points  What time? 0 points  Count back from 20 0 points  Months in reverse 2 points  Repeat phrase 8 points  Total Score 13    Immunization History  Administered Date(s) Administered  . Influenza, High Dose Seasonal PF 10/19/2016  . Influenza-Unspecified 10/25/2012, 11/10/2017  . Tdap 03/05/2009    Qualifies for Shingles Vaccine? yes  Screening Tests Health Maintenance  Topic Date Due  . PNA vac Low Risk Adult (1 of 2 - PCV13) 09/02/2000  . INFLUENZA VACCINE  08/26/2018  . TETANUS/TDAP  03/06/2019  . DEXA SCAN  Completed    Cancer Screenings: Lung: Low Dose CT Chest recommended if Age 23-80 years, 30 pack-year currently smoking OR have quit w/in 15years. Patient does not qualify. Breast:  Up to date on Mammogram? Yes   Up to date of Bone Density/Dexa? Yes Colorectal: not required   Additional Screenings: : Hepatitis C Screening: n/a     Plan:    Prevnar 13 sent to the pharmacy.   I have personally reviewed and noted the following in the patient's chart:   . Medical and social history . Use of alcohol, tobacco or illicit drugs  . Current medications and supplements . Functional ability and status . Nutritional status . Physical activity . Advanced directives . List of other physicians . Hospitalizations, surgeries, and ER visits in previous 12 months . Vitals . Screenings to include cognitive, depression, and falls . Referrals and appointments  In addition, I have reviewed and discussed with patient certain preventive protocols, quality metrics, and best practice recommendations. A written personalized care plan for preventive services as well as general preventive health  recommendations were provided to patient.     Kellie Simmering, LPN  01/31/9676

## 2018-08-30 NOTE — Progress Notes (Addendum)
Subjective:     Patient ID: Tammy HillockLillie M Barillas , female    DOB: 09/12/1933 , 85 y.o.   MRN: [id number]   Chief Complaint  Patient presents with  . Annual Exam   The patient states she uses status post hysterectomy for birth control. Last LMP was No LMP recorded. Patient has had a hysterectomy.. Negative for Dysmenorrhea and Negative for Menorrhagia  Negative for: breast discharge, breast lump(s), breast pain and breast self exam.  Pertinent negatives include abnormal bleeding (hematology), anxiety, decreased libido, depression, difficulty falling sleep, dyspareunia, history of infertility, nocturia, sexual dysfunction, sleep disturbances, urinary incontinence, urinary urgency, vaginal discharge and vaginal itching. Diet regular. The patient states her exercise level is  minimal.     The patient's tobacco use is:  Social History   Tobacco Use  Smoking Status Never Smoker  Smokeless Tobacco Never Used   She has been exposed to passive smoke. The patient's alcohol use is:  Social History   Substance and Sexual Activity  Alcohol Use No    HPI  Here for HM  Hypertension This is a chronic problem. The current episode started more than 1 year ago. The problem is unchanged. The problem is controlled. Pertinent negatives include no anxiety or headaches. There are no known risk factors for coronary artery disease. The current treatment provides no improvement. There are no compliance problems.  There is no history of angina or kidney disease. Hyperparathyroidism: ref podiatry.     Past Medical History:  Diagnosis Date  . Acid reflux   . Glaucoma   . High cholesterol   . Hypertension      Family History  Problem Relation Age of Onset  . Hypertension Mother   . Hypertension Sister   . Hypertension Brother   . Heart disease Brother      Current Outpatient Medications:  .  acetaminophen (TYLENOL) 500 MG tablet, Take 500 mg by mouth every 6 (six) hours as needed for mild pain.,  Disp: , Rfl:  .  amlodipine-olmesartan (AZOR) 10-20 MG tablet, TAKE 1 TABLET BY MOUTH DAILY., Disp: 90 tablet, Rfl: 1 .  bimatoprost (LUMIGAN) 0.03 % ophthalmic solution, Place 1 drop into both eyes at bedtime., Disp: , Rfl:  .  calcium-vitamin D (OSCAL WITH D) 500-200 MG-UNIT per tablet, Take 1 tablet by mouth daily., Disp: , Rfl:  .  donepezil (ARICEPT) 10 MG tablet, Take 1 tablet (10 mg total) by mouth at bedtime., Disp: 90 tablet, Rfl: 4 .  omeprazole (PRILOSEC) 20 MG capsule, Take 1 capsule (20 mg total) by mouth daily., Disp: 90 capsule, Rfl: 1 .  pneumococcal 13-valent conjugate vaccine (PREVNAR 13) SUSP injection, Inject 0.5 mLs into the muscle tomorrow at 10 am for 1 dose., Disp: 0.5 mL, Rfl: 0   Allergies  Allergen Reactions  . Advil [Ibuprofen] Itching  . Ciprofloxacin Other (See Comments)    REACTION: hives  . Zolpidem Tartrate Other (See Comments)    REACTION: trembling  . Unisom [Doxylamine] Swelling and Rash     Review of Systems  Constitutional: Negative.   HENT: Negative.   Eyes: Negative.   Respiratory: Negative.   Cardiovascular: Negative.   Gastrointestinal: Negative.   Endocrine: Negative.   Genitourinary: Negative.   Musculoskeletal: Positive for arthralgias (right knee pain).  Skin: Negative.   Allergic/Immunologic: Negative.   Neurological: Negative.  Negative for dizziness and headaches.  Hematological: Negative.   Psychiatric/Behavioral: Negative.      Today's Vitals   08/30/18 1052  BP: 130/82  Pulse: 66  Temp: 98.5 F (36.9 C)  TempSrc: Oral  Weight: 149 lb 9.6 oz (67.9 kg)  Height: 5' 6.6" (1.692 m)  PainSc: 5   PainLoc: Knee   Body mass index is 23.71 kg/m.   Objective:  Physical Exam Constitutional:      Appearance: Normal appearance. She is well-developed.  HENT:     Head: Normocephalic and atraumatic.     Right Ear: Hearing, tympanic membrane, ear canal and external ear normal.     Left Ear: Hearing, tympanic membrane, ear  canal and external ear normal.  Eyes:     General: Lids are normal.     Extraocular Movements: Extraocular movements intact.     Conjunctiva/sclera: Conjunctivae normal.     Pupils: Pupils are equal, round, and reactive to light.     Funduscopic exam:    Right eye: No papilledema.        Left eye: No papilledema.  Neck:     Musculoskeletal: Full passive range of motion without pain, normal range of motion and neck supple.     Thyroid: No thyroid mass.     Vascular: No carotid bruit.  Cardiovascular:     Rate and Rhythm: Normal rate and regular rhythm.     Pulses: Normal pulses.     Heart sounds: Normal heart sounds. No murmur.  Pulmonary:     Effort: Pulmonary effort is normal.     Breath sounds: Normal breath sounds.  Abdominal:     General: Abdomen is flat. Bowel sounds are normal.     Palpations: Abdomen is soft.  Musculoskeletal: Normal range of motion.        General: No swelling or tenderness (negative pain on palpation to left toe).     Right lower leg: No edema.     Left lower leg: No edema.  Skin:    General: Skin is warm and dry.     Capillary Refill: Capillary refill takes less than 2 seconds.  Neurological:     General: No focal deficit present.     Mental Status: She is alert and oriented to person, place, and time.     Cranial Nerves: No cranial nerve deficit.     Sensory: No sensory deficit.     Comments: At times has some forgetfulness  Psychiatric:        Mood and Affect: Mood normal.        Behavior: Behavior normal.        Thought Content: Thought content normal.        Judgment: Judgment normal.         Assessment And Plan:     1. Health maintenance examination  She had her medicare annual wellness done as well this visit by [facility] . Behavior modifications discussed and diet history reviewed.   . Pt will continue to exercise regularly and modify diet with low GI, plant based foods and decrease intake of processed foods.  . Recommend intake of daily  multivitamin, Vitamin D, and calcium.  . Recommend mammogram for preventive screenings, as well as recommend immunizations that include TDAP  2. Essential hypertension . B/P is controlled.  [name] BMP ordered to check renal function.  . The importance of regular exercise and dietary modification was stressed to the patient.  . EKG done, no change from previous - POCT Urinalysis Dipstick (81002) - POCT UA - Microalbumin - EKG 12-Lead - BMP8+Anion Gap - CBC no Diff  3. Mixed hyperlipidemia  Chronic, controlled  Continue with current medications - Lipid Profile  4. Pain of toe of right foot  She is having intermittent pain to right foot toe, today doing better  5. Chronic pain of right knee  Causing her decreased ambulation, will check xray and provide diclofenac gel to apply to knee - DG Knee Complete 4 Views Right; Future - diclofenac sodium (VOLTAREN) 1 % GEL; Apply 2 g topically 4 (four) times daily.  Dispense: 100 g; Refill: 2   Arnette FeltsJanece Kyrin Gratz, FNP    THE PATIENT IS ENCOURAGED TO PRACTICE SOCIAL DISTANCING DUE TO THE COVID-19 PANDEMIC.

## 2018-08-31 LAB — LIPID PANEL
Chol/HDL Ratio: 2.9 ratio (ref 0.0–4.4)
Cholesterol, Total: 214 mg/dL — ABNORMAL HIGH (ref 100–199)
HDL: 74 mg/dL (ref >39)
LDL Calculated: 125 mg/dL — ABNORMAL HIGH (ref 0–99)
Triglycerides: 77 mg/dL (ref 0–149)
VLDL Cholesterol Cal: 15 mg/dL (ref 5–40)

## 2018-08-31 LAB — BMP8+ANION GAP
Anion Gap: 16 mmol/L (ref 10.0–18.0)
BUN/Creatinine Ratio: 18 (ref 12–28)
BUN: 13 mg/dL (ref 8–27)
CO2: 23 mmol/L (ref 20–29)
Calcium: 9.9 mg/dL (ref 8.7–10.3)
Chloride: 103 mmol/L (ref 96–106)
Creatinine, Ser: 0.74 mg/dL (ref 0.57–1.00)
GFR calc Af Amer: 87 mL/min/{1.73_m2} (ref >59)
GFR calc non Af Amer: 76 mL/min/{1.73_m2} (ref >59)
Glucose: 94 mg/dL (ref 65–99)
Potassium: 4.4 mmol/L (ref 3.5–5.2)
Sodium: 142 mmol/L (ref 134–144)

## 2018-08-31 LAB — CBC
Hematocrit: 44.3 % (ref 34.0–46.6)
Hemoglobin: 14.2 g/dL (ref 11.1–15.9)
MCH: 26.4 pg — ABNORMAL LOW (ref 26.6–33.0)
MCHC: 32.1 g/dL (ref 31.5–35.7)
MCV: 82 fL (ref 79–97)
Platelets: 274 10*3/uL (ref 150–450)
RBC: 5.38 x10E6/uL — ABNORMAL HIGH (ref 3.77–5.28)
RDW: 13.2 % (ref 11.7–15.4)
WBC: 6.6 10*3/uL (ref 3.4–10.8)

## 2018-08-31 LAB — POCT UA - MICROALBUMIN
Creatinine, POC: 100 mg/dL
Microalbumin Ur, POC: 80 mg/L

## 2018-09-01 ENCOUNTER — Telehealth: Payer: Self-pay

## 2018-09-01 ENCOUNTER — Ambulatory Visit: Payer: Self-pay

## 2018-09-01 DIAGNOSIS — G301 Alzheimer's disease with late onset: Secondary | ICD-10-CM

## 2018-09-01 DIAGNOSIS — I10 Essential (primary) hypertension: Secondary | ICD-10-CM

## 2018-09-01 DIAGNOSIS — F0280 Dementia in other diseases classified elsewhere without behavioral disturbance: Secondary | ICD-10-CM

## 2018-09-01 NOTE — Chronic Care Management (AMB) (Signed)
  Chronic Care Management   Social Work Note  09/01/2018 Name: Tammy [name] MRN: [id number] DOB: 05-16-1933  CCM SW placed an outbound call to the patients niece and caregiver [name] who reports the patients mother has recently passed away. [name] requests appointment be moved to a later date.   Follow Up Plan: CCM SW will reschedule patient outreach in two weeks.  Daneen Schick, BSW, CDP Social Worker, Certified Dementia Practitioner [place] [facility] [place] [facility] [phone]

## 2018-09-07 ENCOUNTER — Telehealth: Payer: Self-pay

## 2018-09-11 ENCOUNTER — Ambulatory Visit: Payer: Medicare Other

## 2018-09-11 DIAGNOSIS — F0280 Dementia in other diseases classified elsewhere without behavioral disturbance: Secondary | ICD-10-CM

## 2018-09-11 DIAGNOSIS — I10 Essential (primary) hypertension: Secondary | ICD-10-CM

## 2018-09-11 NOTE — Patient Instructions (Signed)
Social Worker Visit Information  Goals we discussed today:  Goals Addressed            This Visit's Progress   . "My aunt's memory is getting worse       Niece stated  Current Barriers:  [name] Knowledge Deficits related to disease process for Dementia and Self Health management  . Cognitive Deficits  Nurse Case Manager Clinical Goal(s):  [name] Over the next 30 days, niece Joelene Millin [facility]) will verbalize understanding of the plan for patient to follow up with a Neurologist. Completed . Over the next 60 days, niece Joelene Millin will have an increased understanding of the disease process related to Dementia and knowing what to expect. Completed 07/06/18 . Over the next 90 days, niece Joelene Millin will have a plan in place for long term care of the patient.    CCM SW Interventions:  Completed 09/11/18 with [place] call to the patients niece and POA [name] to assess progression of aide and attendance application  Determined [name] has received two letters from the [place] indicating the patients application was received and is in process. Unfortunately, there has not yet been a determination if the patient is eligible  Assessed for patient plan- the patient is currently residing with her sister who is primary caregiver. The patients mother recently passed away within the last two weeks whom she also lived with  Advised by [name] the patient is no longer interested in placement but would rather remain in the home with her sister while receiving in home aide and attendance  Encouraged [name] to contact CCM SW as needed for caregiver support and assistance  Scheduled outbound call over the next month to assess for progression of patient goal   Patient Self Care Activities:  . Currently UNABLE TO independently perform [place]  (sister [name] and niece Joelene Millin are assisting with all care needs and transportation)  Please see past updates related to this  goal by clicking on the "Past Updates" button in the selected goal          Follow Up Plan: SW will follow up with patient by phone over the next month.   Daneen Schick, BSW, CDP Social Worker, Certified Dementia Practitioner [place] [facility] [place] [facility] [phone]

## 2018-09-11 NOTE — Chronic Care Management (AMB) (Signed)
  Chronic Care Management    Work Follow Up Note  09/11/2018 Name: Tammy [name] MRN: [id number] DOB: 1933-02-19  Tammy [name] is a 85 y.o. year old female who is a primary care patient of Tammy [name], [place]. The CCM team was consulted for assistance with [employer]  and Caregiver Stress.   Review of patient status, including review of consultants reports, other relevant assessments, and collaboration with appropriate care team members and the patient's provider was performed as part of comprehensive patient evaluation and provision of chronic care management services.     Goals Addressed            This Visit's Progress   . "My aunt's memory is getting worse       Niece stated  Current Barriers:  [name] Knowledge Deficits related to disease process for Dementia and Self Health management  . Cognitive Deficits  Nurse Case Manager Clinical Goal(s):  [name] Over the next 30 days, niece Tammy [name] [name]) will verbalize understanding of the plan for patient to follow up with a Neurologist. Completed . Over the next 60 days, niece Tammy [name] will have an increased understanding of the disease process related to Dementia and knowing what to expect. Completed 07/06/18 . Over the next 90 days, niece Tammy [name] will have a plan in place for long term care of the patient.    CCM SW Interventions:  Completed 09/11/18 with Tammy [name] call to the patients niece and POA Tammy [name] to assess progression of aide and attendance application  Determined Tammy [name] has received two letters from the [place] indicating the patients application was received and is in process. Unfortunately, there has not yet been a determination if the patient is eligible  Assessed for patient plan- the patient is currently residing with her sister who is primary caregiver. The patients mother recently passed away within the last two weeks whom she also lived with  Advised by Tammy [name] the patient is no  longer interested in placement but would rather remain in the home with her sister while receiving in home aide and attendance  Encouraged Tammy [name] to contact CCM SW as needed for caregiver support and assistance  Scheduled outbound call over the next month to assess for progression of patient goal  Patient Self Care Activities:  . Currently UNABLE TO independently perform [place]  (sister Tammy [name] and niece Tammy [name] are assisting with all care needs and transportation)  Please see past updates related to this goal by clicking on the "Past Updates" button in the selected goal           Follow Up Plan: SW will follow up with patient by phone over the next month.   Tammy [name], BSW, CDP Social Worker, Certified Dementia Practitioner [place] [facility] [place] [facility] [phone]  Total time spent performing care coordination and/or care management activities with the patient by phone or face to face = 12 minutes.

## 2018-09-13 ENCOUNTER — Telehealth: Payer: Medicare Other

## 2018-09-17 ENCOUNTER — Encounter: Payer: Self-pay | Admitting: Nurse Practitioner

## 2018-09-25 ENCOUNTER — Ambulatory Visit
Admission: RE | Admit: 2018-09-25 | Discharge: 2018-09-25 | Disposition: A | Discharge status: Home / Self Care | Payer: Medicare Other | Source: Ambulatory Visit | Attending: Nurse Practitioner | Admitting: Nurse Practitioner

## 2018-09-25 ENCOUNTER — Other Ambulatory Visit: Payer: Self-pay

## 2018-09-25 DIAGNOSIS — M25561 Pain in right knee: Secondary | ICD-10-CM | POA: Diagnosis not present

## 2018-09-25 DIAGNOSIS — G8929 Other chronic pain: Secondary | ICD-10-CM

## 2018-10-03 ENCOUNTER — Ambulatory Visit (INDEPENDENT_AMBULATORY_CARE_PROVIDER_SITE_OTHER): Payer: Medicare Other

## 2018-10-03 DIAGNOSIS — F0280 Dementia in other diseases classified elsewhere without behavioral disturbance: Secondary | ICD-10-CM

## 2018-10-03 DIAGNOSIS — G301 Alzheimer's disease with late onset: Secondary | ICD-10-CM

## 2018-10-03 DIAGNOSIS — I10 Essential (primary) hypertension: Secondary | ICD-10-CM

## 2018-10-03 NOTE — Chronic Care Management (AMB) (Signed)
Chronic Care Management    Social Work Follow Up Note  10/03/2018 Name: Tammy [name] MRN: [id number] DOB: 07-Dec-1933  Tammy [name] is a 85 y.o. year old female who is a primary care patient of [name], [place]. The CCM team was consulted for assistance with [employer] .   Review of patient status, including review of consultants reports, other relevant assessments, and collaboration with appropriate care team members and the patient's provider was performed as part of comprehensive patient evaluation and provision of chronic care management services.     Outpatient Encounter Medications as of 10/03/2018  Medication Sig  . acetaminophen (TYLENOL) 500 MG tablet Take 500 mg by mouth every 6 (six) hours as needed for mild pain.  [name] amlodipine-olmesartan (AZOR) 10-20 MG tablet TAKE 1 TABLET BY MOUTH DAILY.  . bimatoprost (LUMIGAN) 0.03 % ophthalmic solution Place 1 drop into both eyes at bedtime.  . calcium-vitamin D (OSCAL WITH D) 500-200 MG-UNIT per tablet Take 1 tablet by mouth daily.  . diclofenac sodium (VOLTAREN) 1 % GEL Apply 2 g topically 4 (four) times daily.  [name] donepezil (ARICEPT) 10 MG tablet Take 1 tablet (10 mg total) by mouth at bedtime.  [name] omeprazole (PRILOSEC) 20 MG capsule Take 1 capsule (20 mg total) by mouth daily.   No facility-administered encounter medications on file as of 10/03/2018.      Goals Addressed            This Visit's Progress   . "[facility] said [facility] needs to be [facility]"       POA Tammy [name] Stated:  Current Barriers:  . Incomplete paperwork submission . Unable to update forms without physician approval  Clinical Social Work Clinical Goal(s):  [name] Over the next 30 days, the patient and her physician will work with SW to update VA aide and attendance forms.  CCM SW Interventions: Completed 10/03/2018 . Communication with the patients [place] who reported needing the patients aide and attendance application to be updated  with diagnoses related to dementia . Advised Tammy [name] CCM SW would contact the patients primary provider to request updated forms . Communication with the patients provider via in basket message regarding patient goal  Patient Self Care Activities:  . Attends all scheduled provider appointments . Calls provider office for new concerns or questions  Initial goal documentation     . "we are worried about her agitation and wondering if she needs ativan"       POA Tammy [name] stated  Current Barriers:  [name] Memory Deficits . Limited knowledge of medication options to address agitation  Clinical Social Work Clinical Goal(s):  [name] Over the next 30 days, client will follow up with her neurologist as directed by SW  CCM SW Interventions: Completed with the patients POA Tammy [name] on 10/03/2018 . Outbound call placed to Tammy [name] in response to a voice message CCM SW received . Assessed for patient disease progression . Determined the patient has began experiencing agitation most days especially during the evening hours "I am wondering if she needs Ativan" . Discussed the importance of following up with the patients physician to reports new onset of symptoms and determine if medication is appropriate for intervention . Provided brief education on the difference in a scheduled medication and a PRN as well as the difference in utilizing a mood stabilizer versus an anxiolytic . Assessed for possibility of the patient experiencing agitation due to alternate causes such as a UTI;  determined the patient has experienced agitation "for a while" with no signs or symptoms of fever, increased urgency, frequency, or odor . Advised Tammy [name] to keep track of frequency of agitation as well as timing to aide in the physician determining a medication schedule . Scheduled follow up call to the patient's POA over the next 3 weeks to assess progression of patient goal  Patient Self Care Activities:  .  Attends all scheduled provider appointments . Performs ADL's independently . Calls provider office for new concerns or questions  Initial goal documentation         Follow Up Plan: SW will follow up with patient by phone over the next three weeks.   Tammy [name], BSW, CDP Social Worker, Certified Dementia Practitioner [facility] [phone]  Total time spent performing care coordination and/or care management activities with the patient by phone or face to face = 23 minutes.

## 2018-10-03 NOTE — Patient Instructions (Signed)
Social Worker Visit Information  Goals we discussed today:  Goals Addressed            This Visit's Progress   . "The [facility] said the paperwork needs to be adjusted"       POA [name] Stated:  Current Barriers:  . Incomplete paperwork submission . Unable to update forms without physician approval  Clinical Social Work Clinical Goal(s):  [name] Over the next 30 days, the patient and her physician will work with SW to update VA aide and attendance forms.  CCM SW Interventions: Completed 10/03/2018 . Communication with the patients [place] who reported needing the patients aide and attendance application to be updated with diagnoses related to dementia . Advised [name] CCM SW would contact the patients primary provider to request updated forms . Communication with the patients provider via in basket message regarding patient goal  Patient Self Care Activities:  . Attends all scheduled provider appointments . Calls provider office for new concerns or questions  Initial goal documentation     . "we are worried about her agitation and wondering if she needs ativan"       POA [name] stated  Current Barriers:  [name] Memory Deficits . Limited knowledge of medication options to address agitation  Clinical Social Work Clinical Goal(s):  [name] Over the next 30 days, client will follow up with her neurologist as directed by SW  CCM SW Interventions: Completed with the patients POA [name] on 10/03/2018 . Outbound call placed to [name] in response to a voice message CCM SW received . Assessed for patient disease progression . Determined the patient has began experiencing agitation most days especially during the evening hours "I am wondering if she needs Ativan" . Discussed the importance of following up with the patients physician to reports new onset of symptoms and determine if medication is appropriate for intervention . Provided brief education on the  difference in a scheduled medication and a PRN as well as the difference in utilizing a mood stabilizer versus an anxiolytic . Assessed for possibility of the patient experiencing agitation due to alternate causes such as a UTI; determined the patient has experienced agitation "for a while" with no signs or symptoms of fever, increased urgency, frequency, or odor . Advised [name] to keep track of frequency of agitation as well as timing to aide in the physician determining a medication schedule . Scheduled follow up call to the patient's POA over the next 3 weeks to assess progression of patient goal  Patient Self Care Activities:  . Attends all scheduled provider appointments . Performs ADL's independently . Calls provider office for new concerns or questions  Initial goal documentation        Follow Up Plan: SW will follow up with patient by phone over the next three weeks.   Daneen Schick, BSW, CDP Social Worker, Certified Dementia Practitioner [place] [facility] [place] [facility] [phone]

## 2018-10-12 ENCOUNTER — Telehealth: Payer: Medicare Other

## 2018-10-16 ENCOUNTER — Ambulatory Visit: Payer: Medicare Other

## 2018-10-16 DIAGNOSIS — I10 Essential (primary) hypertension: Secondary | ICD-10-CM

## 2018-10-16 DIAGNOSIS — F0280 Dementia in other diseases classified elsewhere without behavioral disturbance: Secondary | ICD-10-CM

## 2018-10-16 NOTE — Chronic Care Management (AMB) (Signed)
Chronic Care Management    Social Work Follow Up Note  10/16/2018 Name: MAYANA IRIGOYEN MRN: [id number] DOB: 11/18/1933  MELODI HAPPEL is a 85 y.o. year old female who is a primary care patient of [name], [place]. The CCM team was consulted for assistance with [employer] .   Review of patient status, including review of consultants reports, other relevant assessments, and collaboration with appropriate care team members and the patient's provider was performed as part of comprehensive patient evaluation and provision of chronic care management services.     Outpatient Encounter Medications as of 10/16/2018  Medication Sig  . acetaminophen (TYLENOL) 500 MG tablet Take 500 mg by mouth every 6 (six) hours as needed for mild pain.  [name] amlodipine-olmesartan (AZOR) 10-20 MG tablet TAKE 1 TABLET BY MOUTH DAILY.  . bimatoprost (LUMIGAN) 0.03 % ophthalmic solution Place 1 drop into both eyes at bedtime.  . calcium-vitamin D (OSCAL WITH D) 500-200 MG-UNIT per tablet Take 1 tablet by mouth daily.  . diclofenac sodium (VOLTAREN) 1 % GEL Apply 2 g topically 4 (four) times daily.  [name] donepezil (ARICEPT) 10 MG tablet Take 1 tablet (10 mg total) by mouth at bedtime.  [name] omeprazole (PRILOSEC) 20 MG capsule Take 1 capsule (20 mg total) by mouth daily.   No facility-administered encounter medications on file as of 10/16/2018.      Goals Addressed            This Visit's Progress   . "The [facility] said the paperwork needs to be adjusted"       POA [name] Stated:  Current Barriers:  . Incomplete paperwork submission . Unable to update forms without physician approval  Clinical Social Work Clinical Goal(s):  [name] Over the next 30 days, the patient and her physician will work with SW to update VA aide and attendance forms.  CCM SW Interventions: Completed 10/16/2018 . Outbound call to the patients niece and caregiver [name] . Informed the patients provider has the [place] documents in hand  and plans to update them with new diagnoses . [name] reports she has an appointment to see the patients provider in the coming 2 weeks and will plan to pick up documents at that time if they are ready . Scheduled follow up call over the next 4-6 weeks to assess progression of aide and attendance application  Patient Self Care Activities:  . Attends all scheduled provider appointments . Calls provider office for new concerns or questions  Please see past updates related to this goal by clicking on the "Past Updates" button in the selected goal      . "we are worried about her agitation and wondering if she needs ativan"   On track    [place] stated  Current Barriers:  [name] Memory Deficits . Limited knowledge of medication options to address agitation  Clinical Social Work Clinical Goal(s):  [name] Over the next 30 days, client will follow up with her neurologist as directed by SW  CCM SW Interventions: Completed with the patients POA [name] on 10/16/2018 . Outbound call placed to [name] to assess progression of patient stated goal . Determined the patients agitation has improved some with altered approach by family members . [name] stated "I told aunt Valetta Fuller that when she gets agitated to give her some space and not keep talking about it" . Reinforced the importance of how caregivers respond to someone with Alzheimer's in order to have a  successful day . Scheduled follow up call over the next 4-6 weeks to assess for further behavior concerns prior to closing goal  Patient Self Care Activities:  . Attends all scheduled provider appointments . Performs ADL's independently . Calls provider office for new concerns or questions  Please see past updates related to this goal by clicking on the "Past Updates" button in the selected goal          Follow Up Plan: SW will follow up with patient by phone over the next month.   [name], BSW, CDP Social  Worker, Certified Dementia Practitioner [facility] Care Management [phone]  Total time spent performing care coordination and/or care management activities with the patient by phone or face to face = 7 minutes.

## 2018-10-16 NOTE — Patient Instructions (Signed)
Social Worker Visit Information  Goals we discussed today:  Goals Addressed            This Visit's Progress   . "The [facility] said the paperwork needs to be adjusted"       POA [name] Stated:  Current Barriers:  . Incomplete paperwork submission . Unable to update forms without physician approval  Clinical Social Work Clinical Goal(s):  [name] Over the next 30 days, the patient and her physician will work with SW to update VA aide and attendance forms.  CCM SW Interventions: Completed 10/16/2018 . Outbound call to the patients niece and caregiver [name] . Informed the patients provider has the [place] documents in hand and plans to update them with new diagnoses . [name] reports she has an appointment to see the patients provider in the coming 2 weeks and will plan to pick up documents at that time if they are ready . Scheduled follow up call over the next 4-6 weeks to assess progression of aide and attendance application  Patient Self Care Activities:  . Attends all scheduled provider appointments . Calls provider office for new concerns or questions  Please see past updates related to this goal by clicking on the "Past Updates" button in the selected goal      . "we are worried about her agitation and wondering if she needs ativan"   On track    [place] stated  Current Barriers:  [name] Memory Deficits . Limited knowledge of medication options to address agitation  Clinical Social Work Clinical Goal(s):  [name] Over the next 30 days, client will follow up with her neurologist as directed by SW  CCM SW Interventions: Completed with the patients POA [name] on 10/16/2018 . Outbound call placed to [name] to assess progression of patient stated goal . Determined the patients agitation has improved some with altered approach by family members . [name] stated "I told aunt Valetta Fuller that when she gets agitated to give her some space and not keep talking about  it" . Reinforced the importance of how caregivers respond to someone with Alzheimer's in order to have a successful day . Scheduled follow up call over the next 4-6 weeks to assess for further behavior concerns prior to closing goal  Patient Self Care Activities:  . Attends all scheduled provider appointments . Performs ADL's independently . Calls provider office for new concerns or questions  Please see past updates related to this goal by clicking on the "Past Updates" button in the selected goal          Follow Up Plan: SW will follow up with patient by phone over the next month.   Daneen Schick, BSW, CDP Social Worker, Certified Dementia Practitioner [place] [facility] [place] [facility] [phone]

## 2018-10-24 ENCOUNTER — Telehealth: Payer: Self-pay

## 2018-11-16 ENCOUNTER — Ambulatory Visit: Payer: Medicare Other

## 2018-11-16 DIAGNOSIS — F0280 Dementia in other diseases classified elsewhere without behavioral disturbance: Secondary | ICD-10-CM

## 2018-11-16 DIAGNOSIS — G301 Alzheimer's disease with late onset: Secondary | ICD-10-CM

## 2018-11-16 NOTE — Patient Instructions (Signed)
Social Worker Visit Information  Goals we discussed today:  Goals Addressed            This Visit's Progress   . "The VA said the paperwork needs to be adjusted"   Not on track    [place] Stated:  Current Barriers:  . Incomplete paperwork submission . Unable to update forms without physician approval  Clinical Social Work Clinical Goal(s):  [name] Over the next 30 days, the patient and her physician will work with SW to update VA aide and attendance forms. Completed . New 11/16/2018- Over the next 60 days the patient will be knowledgeable of available resources to assist with VA benefit navigation  CCM SW Interventions: Completed 11/16/2018 . Outbound call to the patients niece and caregiver Tammy [name] to assess progression of patient goal . Determined the patient's aide and attendance application has been denied due to ruling the patient is not in need of caregiver resources at this time . Assessed for ability to appeal ruling - Tammy [name] is unsure if an appeal is an option . Discussed opportunity to involve a VA benefit specialist to assist with future application process . Provided information to [name] via [place] . Scheduled follow up call over the next month to assess progression of patient goal  Patient Self Care Activities:  . Attends all scheduled provider appointments . Calls provider office for new concerns or questions  Please see past updates related to this goal by clicking on the "Past Updates" button in the selected goal      . COMPLETED: "we are worried about her agitation and wondering if she needs ativan"       POA Tammy [name] stated  Current Barriers:  [name] Memory Deficits . Limited knowledge of medication options to address agitation  Clinical Social Work Clinical Goal(s):  [name] Over the next 30 days, client will follow up with her neurologist as directed by SW  CCM SW Interventions: Completed with the patients POA Tammy [name] on 11/16/2018 . Outbound call placed to Tammy. [name] to assess progression of patient stated goal . Determined the patients agitation has improved with not recent episodes of anxiety or agitation toward family . Advised Tammy. [name] to contact the patients care team with future concerns . Goal Met  Patient Self Care Activities:  . Attends all scheduled provider appointments . Performs ADL's independently . Calls provider office for new concerns or questions  Please see past updates related to this goal by clicking on the "Past Updates" button in the selected goal          Materials Provided: Verbal education about VA benefit specialist provided by phone  Follow Up Plan: SW will follow up with patient by phone over the next month  Tammy [name], BSW, CDP Social Worker, Certified Dementia Practitioner [place] [facility] [place] [facility] [phone]

## 2018-11-16 NOTE — Chronic Care Management (AMB) (Signed)
Chronic Care Management   Social Work Follow Up Note  11/16/2018 Name: Tammy [name] MRN: [id number] DOB: 1933-10-31  Tammy [name] is a 85 y.o. year old female who is a primary care patient of [name], [place]. The CCM team was consulted for assistance with [employer] .   Review of patient status, including review of consultants reports, other relevant assessments, and collaboration with appropriate care team members and the patient's provider was performed as part of comprehensive patient evaluation and provision of chronic care management services.    SW placed an outbound call to the patient niece [name] to assess progression of aide and attendance application. See care plan below.  Outpatient Encounter Medications as of 11/16/2018  Medication Sig  . acetaminophen (TYLENOL) 500 MG tablet Take 500 mg by mouth every 6 (six) hours as needed for mild pain.  Tammy [name] amlodipine-olmesartan (AZOR) 10-20 MG tablet TAKE 1 TABLET BY MOUTH DAILY.  . bimatoprost (LUMIGAN) 0.03 % ophthalmic solution Place 1 drop into both eyes at bedtime.  . calcium-vitamin D (OSCAL WITH D) 500-200 MG-UNIT per tablet Take 1 tablet by mouth daily.  . diclofenac sodium (VOLTAREN) 1 % GEL Apply 2 g topically 4 (four) times daily.  Tammy [name] donepezil (ARICEPT) 10 MG tablet Take 1 tablet (10 mg total) by mouth at bedtime.  Tammy [name] omeprazole (PRILOSEC) 20 MG capsule Take 1 capsule (20 mg total) by mouth daily.   No facility-administered encounter medications on file as of 11/16/2018.      Goals Addressed            This Visit's Progress   . "The VA said the paperwork needs to be adjusted"   Not on track    [place] Stated:  Current Barriers:  . Incomplete paperwork submission . Unable to update forms without physician approval  Clinical Social Work Clinical Goal(s):  Tammy [name] Over the next 30 days, the patient and her physician will work with SW to update VA aide and attendance forms. Completed . New  11/16/2018- Over the next 60 days the patient will be knowledgeable of available resources to assist with VA benefit navigation  CCM SW Interventions: Completed 11/16/2018 . Outbound call to the patients niece and caregiver [name] to assess progression of patient goal . Determined the patient's aide and attendance application has been denied due to ruling the patient is not in need of caregiver resources at this time . Assessed for ability to appeal ruling - Mrs Gorden Harms is unsure if an appeal is an option . Discussed opportunity to involve a VA benefit specialist to assist with future application process . Provided information to [place] Specialist via [place] . Scheduled follow up call over the next month to assess progression of patient goal  Patient Self Care Activities:  . Attends all scheduled provider appointments . Calls provider office for new concerns or questions  Please see past updates related to this goal by clicking on the "Past Updates" button in the selected goal      . COMPLETED: "we are worried about her agitation and wondering if she needs ativan"       POA [name] stated  Current Barriers:  Tammy [name] Memory Deficits . Limited knowledge of medication options to address agitation  Clinical Social Work Clinical Goal(s):  Tammy [name] Over the next 30 days, client will follow up with her neurologist as directed by SW  CCM SW Interventions: Completed with the patients POA [name] on 11/16/2018 .  Outbound call placed to [name] to assess progression of patient stated goal . Determined the patients agitation has improved with not recent episodes of anxiety or agitation toward family . Advised [name] to contact the patients care team with future concerns . Goal Met  Patient Self Care Activities:  . Attends all scheduled provider appointments . Performs ADL's independently . Calls provider office for new concerns or questions  Please see  past updates related to this goal by clicking on the "Past Updates" button in the selected goal          Follow Up Plan: SW will follow up with patient by phone over the next month.  Daneen Schick, BSW, CDP Social Worker, Certified Dementia Practitioner [place] [facility] [place] [facility] [phone]  Total time spent performing care coordination and/or care management activities with the patient by phone or face to face = 11 minutes.

## 2018-11-23 ENCOUNTER — Telehealth: Payer: Self-pay

## 2018-11-24 ENCOUNTER — Ambulatory Visit (INDEPENDENT_AMBULATORY_CARE_PROVIDER_SITE_OTHER): Payer: Medicare Other

## 2018-11-24 DIAGNOSIS — I10 Essential (primary) hypertension: Secondary | ICD-10-CM

## 2018-11-24 DIAGNOSIS — F0280 Dementia in other diseases classified elsewhere without behavioral disturbance: Secondary | ICD-10-CM | POA: Diagnosis not present

## 2018-11-24 DIAGNOSIS — E782 Mixed hyperlipidemia: Secondary | ICD-10-CM

## 2018-11-24 DIAGNOSIS — G301 Alzheimer's disease with late onset: Secondary | ICD-10-CM

## 2018-11-27 NOTE — Patient Instructions (Signed)
Visit Information  Goals Addressed    . "My aunt's memory is getting worse       Niece stated  Current Barriers:  [name] Knowledge Deficits related to disease process for Dementia and Self Health management  . Cognitive Deficits  Nurse Case Manager Clinical Goal(s):  [name] Over the next 30 days, niece [name] [facility]) will verbalize understanding of the plan for patient to follow up with a Neurologist. Completed . Over the next 60 days, niece [name] will have an increased understanding of the disease process related to Dementia and knowing what to expect. Completed 07/06/18 . Over the next 90 days, niece [name] will have a plan in place for long term care of the patient. Completed 11/27/18 . New - 11/27/18 Over the next 90 days, patient will remain Neurologically and Cognitively stable and without ED visits and or IP admissions secondary to Alzheimer's Dementia  CCM RN CM Interventions:  11/27/18: completed call with niece and POA [name]  . Evaluation of current treatment plan related to Alzheimer's Dementia and patient's adherence to plan as established by provider. . Reviewed medications with patient and discussed patient continues to take Aricept as directed with improvements noted in her memory and behavior; discussed patient has been able to cope with the recent loss of her mother in August 2020; discussed patient is currently aware of other current situations and recently drove her car a few miles to her niece Kimberly's home a couple of blocks away; discussed Ms. Halbleib continues to rely on niece [name] and her sister Florentina Addison for caregiver assistance and remains to be living with her sister Florentina Addison . Discussed plans with patient for ongoing care management follow up and provided patient with direct contact information for care management team . Discussed patient continues to have her Dementia managed by Neurologist Dr. Despina Arias; discussed next upcoming appointment is scheduled for  01/03/19 @11 :30 AM - discussed has the contact information needed for this provider . Discussed patient will also follow up with PCP provider [name], FNP on 01/03/19 @9 :00 AM . Positive reinforcement and support provided to caregiver 14/9/20 as on of the patient's primary caregiver's  . Reiterated the importance of notifying the CCM team and or PCP of any/all changes noted to patient's overall health status including the Dementia . Encouraged niece to notify the CCM team of Care Coordination and or DME needs as they arise  Patient Self Care Activities:  . Currently UNABLE TO independently perform Self Health Care  (sister [name] and niece [name] are assisting with all care needs and transportation)  Please see past updates related to this goal by clicking on the "Past Updates" button in the selected goal       . "The VA said the paperwork needs to be adjusted"       POA Florentina Addison Stated:  Current Barriers:  . Incomplete paperwork submission . Unable to update forms without physician approval  Clinical Social Work Clinical Goal(s):  [name] Over the next 30 days, the patient and her physician will work with SW to update VA aide and attendance forms. Completed . New 11/16/2018- Over the next 60 days the patient will be knowledgeable of available resources to assist with VA benefit navigation  CCM SW Interventions: Completed 11/16/2018 . Outbound call to the patients niece and caregiver 11/18/2018 to assess progression of patient goal . Determined the patient's aide and attendance application has been denied due to ruling the patient is not in need  of caregiver resources at this time . Assessed for ability to appeal ruling - Mrs Gorden Harms is unsure if an appeal is an option . Discussed opportunity to involve a VA benefit specialist to assist with future application process . Provided information to [place] Specialist via [place] . Scheduled follow up  call over the next month to assess progression of patient goal  CCM RN CM Interventions: 11/27/18 call completed with niece and caregiver Dorann Ou  . Evaluation of caregiver plan and progress toward f/u with the [facility] Specialist . Discussed niece Joelene Millin has not been able to follow up with the [place]; discussed she has been overwhelmed with being one of the primary caregivers for the patient and is trying to manage her own health related conditions . Provided active listening and validated niece's feelings of caregiver stress; encouraged her to f/u with the [facility] as soon as able to explore her options for patient VA benefits and or appealing the [place] decision for denial of VA aide and attendance  . Collaborated with embedded BSW Kendra Humble via in basket message regarding exploration of any/all other resources that may be available for patient to offer respite care as needed . Discussed plans with patient for ongoing care management follow up and provided patient with direct contact information for care management team  Patient Self Care Activities:  . Attends all scheduled provider appointments . Calls provider office for new concerns or questions  Please see past updates related to this goal by clicking on the "Past Updates" button in the selected goal         The patient verbalized understanding of instructions provided today and declined a print copy of patient instruction materials.   Telephone follow up appointment with care management team member scheduled for: 01/05/19  Barb Merino, RN, BSN, CCM Care Management Coordinator [place] [facility]  Direct Phone: [phone]

## 2018-11-27 NOTE — Chronic Care Management (AMB) (Signed)
Chronic Care Management   Follow Up Note   11/27/2018 Name: Darrelyn HillockLillie M Heinkel MRN: [id number] DOB: 07/24/1933  Referred by: Arnette FeltsMoore, Janece, FNP Reason for referral : Chronic Care Management (CCM RNCM Telephone Outreach )   Darrelyn HillockLillie M Torrez is a 85 y.o. year old female who is a primary care patient of Arnette FeltsMoore, Janece, FNP. The CCM team was consulted for assistance with chronic disease management and care coordination needs.    Review of patient status, including review of consultants reports, relevant laboratory and other test results, and collaboration with appropriate care team members and the patient's provider was performed as part of comprehensive patient evaluation and provision of chronic care management services.    SDOH (Social Determinants of Health) screening performed today: Stress. See Care Plan for related entries.   Advanced Directives Status: N See Care Plan and [place] application for related entries.  CCM RN CM outbound call was placed to patient's niece Margarita RanaKimberly Jeffries today for a CCM follow up.   Outpatient Encounter Medications as of 11/24/2018  Medication Sig  . acetaminophen (TYLENOL) 500 MG tablet Take 500 mg by mouth every 6 (six) hours as needed for mild pain.  [name]. amlodipine-olmesartan (AZOR) 10-20 MG tablet TAKE 1 TABLET BY MOUTH DAILY.  . bimatoprost (LUMIGAN) 0.03 % ophthalmic solution Place 1 drop into both eyes at bedtime.  . calcium-vitamin D (OSCAL WITH D) 500-200 MG-UNIT per tablet Take 1 tablet by mouth daily.  . diclofenac sodium (VOLTAREN) 1 % GEL Apply 2 g topically 4 (four) times daily.  [name]. donepezil (ARICEPT) 10 MG tablet Take 1 tablet (10 mg total) by mouth at bedtime.  [name]. omeprazole (PRILOSEC) 20 MG capsule Take 1 capsule (20 mg total) by mouth daily.   No facility-administered encounter medications on file as of 11/24/2018.      Goals Addressed    . "My aunt's memory is getting worse       Niece stated  Current Barriers:  [name]. Knowledge Deficits  related to disease process for Dementia and Self Health management  . Cognitive Deficits  Nurse Case Manager Clinical Goal(s):  [name]. Over the next 30 days, [facility]) will verbalize understanding of the plan for patient to follow up with a Neurologist. Completed . Over the next 60 days, niece Cala BradfordKimberly will have an increased understanding of the disease process related to Dementia and knowing what to expect. Completed 07/06/18 . Over the next 90 days, niece Cala BradfordKimberly will have a plan in place for long term care of the patient. Completed 11/27/18 . New - 11/27/18 Over the next 90 days, patient will remain Neurologically and Cognitively stable and without ED visits and or IP admissions secondary to Alzheimer's Dementia  CCM RN CM Interventions:  11/27/18: completed call with niece and POA Margarita RanaKimberly Jeffries  . Evaluation of current treatment plan related to Alzheimer's Dementia and patient's adherence to plan as established by provider. . Reviewed medications with patient and discussed patient continues to take Aricept as directed with improvements noted in her memory and behavior; discussed patient has been able to cope with the recent loss of her mother in August 2020; discussed patient is currently aware of other current situations and recently drove her car a few miles to her niece Kimberly's home a couple of blocks away; discussed Ms. Theadora RamaClanton continues to rely on niece Cala BradfordKimberly and her sister Florentina AddisonKatie for caregiver assistance and remains to be living with her sister Florentina AddisonKatie . Discussed plans with patient for ongoing care management follow  up and provided patient with direct contact information for care management team . Discussed patient continues to have her Dementia managed by Neurologist Dr. Arlice Colt; discussed next upcoming appointment is scheduled for 01/03/19 @11 :30 AM - discussed Joelene Millin has the contact information needed for this provider . Discussed patient will also follow up with PCP  provider [name], FNP on 01/03/19 @9 :00 AM . Positive reinforcement and support provided to caregiver Joelene Millin as on of the patient's primary caregiver's  . Reiterated the importance of Joelene Millin notifying the CCM team and or PCP of any/all changes noted to patient's overall health status including the Dementia . Encouraged niece to notify the CCM team of Care Coordination and or DME needs as they arise  Patient Self Care Activities:  . Currently UNABLE TO independently perform [place]  (sister [name] and niece Joelene Millin are assisting with all care needs and transportation)  Please see past updates related to this goal by clicking on the "Past Updates" button in the selected goal      . "The VA said the paperwork needs to be adjusted"       POA [name] Stated:  Current Barriers:  . Incomplete paperwork submission . Unable to update forms without physician approval  Clinical Social Work Clinical Goal(s):  [name] Over the next 30 days, the patient and her physician will work with SW to update VA aide and attendance forms. Completed . New 11/16/2018- Over the next 60 days the patient will be knowledgeable of available resources to assist with VA benefit navigation  CCM SW Interventions: Completed 11/16/2018 . Outbound call to the patients niece and caregiver [name] to assess progression of patient goal . Determined the patient's aide and attendance application has been denied due to ruling the patient is not in need of caregiver resources at this time . Assessed for ability to appeal ruling - Mrs Gorden Harms is unsure if an appeal is an option . Discussed opportunity to involve a VA benefit specialist to assist with future application process . Provided information to [place] Specialist via [place] . Scheduled follow up call over the next month to assess progression of patient goal  CCM RN CM Interventions: 11/27/18 call completed with niece and caregiver  Dorann Ou  . Evaluation of caregiver plan and progress toward f/u with the [facility] Specialist . Discussed niece Joelene Millin has not been able to follow up with the [place]; discussed she has been overwhelmed with being one of the primary caregivers for the patient and is trying to manage her own health related conditions . Provided active listening and validated niece's feelings of caregiver stress; encouraged her to f/u with the [facility] as soon as able to explore her options for patient VA benefits and or appealing the [place] decision for denial of VA aide and attendance  . Collaborated with embedded BSW Kendra Humble via in basket message regarding exploration of any/all other resources that may be available for patient to offer respite care as needed . Discussed plans with patient for ongoing care management follow up and provided patient with direct contact information for care management team  Patient Self Care Activities:  . Attends all scheduled provider appointments . Calls provider office for new concerns or questions  Please see past updates related to this goal by clicking on the "Past Updates" button in the selected goal          Telephone follow up appointment with care management  team member scheduled for: 01/05/19   Delsa Sale, RN, BSN, CCM Care Management Coordinator [facility]  Direct Phone: [phone]

## 2018-11-28 ENCOUNTER — Ambulatory Visit: Payer: Self-pay

## 2018-11-28 DIAGNOSIS — F0280 Dementia in other diseases classified elsewhere without behavioral disturbance: Secondary | ICD-10-CM

## 2018-11-28 NOTE — Chronic Care Management (AMB) (Signed)
  Chronic Care Management     Social Work Follow Up Note  11/28/2018 Name: Tammy [name] MRN: [id number] DOB: Oct 20, 1933  Tammy [name] is a 85 y.o. year old female who is a primary care patient of [name], [place]. The CCM team was consulted for assistance with care coordination.   Review of patient status, including review of consultants reports, other relevant assessments, and collaboration with appropriate care team members and the patient's provider was performed as part of comprehensive patient evaluation and provision of chronic care management services.     Outpatient Encounter Medications as of 11/28/2018  Medication Sig  . acetaminophen (TYLENOL) 500 MG tablet Take 500 mg by mouth every 6 (six) hours as needed for mild pain.  [name] amlodipine-olmesartan (AZOR) 10-20 MG tablet TAKE 1 TABLET BY MOUTH DAILY.  . bimatoprost (LUMIGAN) 0.03 % ophthalmic solution Place 1 drop into both eyes at bedtime.  . calcium-vitamin D (OSCAL WITH D) 500-200 MG-UNIT per tablet Take 1 tablet by mouth daily.  . diclofenac sodium (VOLTAREN) 1 % GEL Apply 2 g topically 4 (four) times daily.  [name] donepezil (ARICEPT) 10 MG tablet Take 1 tablet (10 mg total) by mouth at bedtime.  [name] omeprazole (PRILOSEC) 20 MG capsule Take 1 capsule (20 mg total) by mouth daily.   No facility-administered encounter medications on file as of 11/28/2018.      Goals Addressed            This Visit's Progress   . "The VA said the paperwork needs to be adjusted"       POA [name] Stated:  Current Barriers:  . Incomplete paperwork submission . Unable to update forms without physician approval  Clinical Social Work Clinical Goal(s):  [name] Over the next 30 days, the patient and her physician will work with SW to update VA aide and attendance forms. Completed . New 11/16/2018- Over the next 60 days the patient will be knowledgeable of available resources to assist with VA benefit navigation  CCM SW Interventions:  Completed 11/28/2018 . Received communication from RN Case Manager [place] requesting assistance with caregiver stress and resources to assist with patient care needs . Collaboration with Carole Binning, Director of [facility] Service program requesting resource information to assist with patient care needs . Follow up planned to patients niece over the next week  Patient Self Care Activities:  . Attends all scheduled provider appointments . Calls provider office for new concerns or questions  Please see past updates related to this goal by clicking on the "Past Updates" button in the selected goal          Follow Up Plan: SW will follow up with patient by phone over the next week.   Daneen Schick, BSW, CDP Social Worker, Certified Dementia Practitioner [place] [facility] [place] [facility] [phone]  Total time spent performing care coordination and/or care management activities with the patient by phone or face to face = 5 minutes.

## 2018-11-28 NOTE — Patient Instructions (Signed)
Social Worker Visit Information  Goals we discussed today:  Goals Addressed            This Visit's Progress   . "The VA said the paperwork needs to be adjusted"       POA [name] Stated:  Current Barriers:  . Incomplete paperwork submission . Unable to update forms without physician approval  Clinical Social Work Clinical Goal(s):  [name] Over the next 30 days, the patient and her physician will work with SW to update VA aide and attendance forms. Completed . New 11/16/2018- Over the next 60 days the patient will be knowledgeable of available resources to assist with VA benefit navigation  CCM SW Interventions: Completed 11/28/2018 . Received communication from RN Case Manager [place] requesting assistance with caregiver stress and resources to assist with patient care needs . Collaboration with Carole Binning, Director of [facility] Service program requesting resource information to assist with patient care needs . Follow up planned to patients niece over the next week  Patient Self Care Activities:  . Attends all scheduled provider appointments . Calls provider office for new concerns or questions  Please see past updates related to this goal by clicking on the "Past Updates" button in the selected goal          Follow Up Plan: SW will follow up with patient by phone over the next week.   Daneen Schick, BSW, CDP Social Worker, Certified Dementia Practitioner [place] [facility] [place] [facility] [phone]

## 2018-12-01 ENCOUNTER — Ambulatory Visit: Payer: Medicare Other

## 2018-12-01 DIAGNOSIS — F0280 Dementia in other diseases classified elsewhere without behavioral disturbance: Secondary | ICD-10-CM

## 2018-12-01 NOTE — Patient Instructions (Signed)
Social Worker Visit Information  Goals we discussed today:  Goals Addressed            This Visit's Progress   . "The VA said the paperwork needs to be adjusted"       POA [name] Stated:  Current Barriers:  . Incomplete paperwork submission . Unable to update forms without physician approval  Clinical Social Work Clinical Goal(s):  [name] Over the next 30 days, the patient and her physician will work with SW to update VA aide and attendance forms. Completed . New 11/16/2018- Over the next 60 days the patient will be knowledgeable of available resources to assist with VA benefit navigation  CCM SW Interventions: Completed 12/01/2018 . Outbound call to the patients niece and caregiver [name] . Discussed SW recent outreach to [facility] to inquire caregiver resources . Determined Mrs Gorden Harms is not interested in resources at this time stating the patient is doing well  . Encouraged Mrs Gorden Harms to contact CM team in the event she changes her mind . Scheduled follow up call over the next month to assess patient/caregiver need  CCM RN CM Interventions: 11/27/18 call completed with niece and caregiver Dorann Ou  . Evaluation of caregiver plan and progress toward f/u with the [facility] . Discussed niece Joelene Millin has not been able to follow up with the [place]; discussed she has been overwhelmed with being one of the primary caregivers for the patient and is trying to manage her own health related conditions . Provided active listening and validated niece's feelings of caregiver stress; encouraged her to f/u with the [facility] as soon as able to explore her options for patient VA benefits and or appealing the [place] decision for denial of VA aide and attendance  . Collaborated with embedded BSW Melicia Esqueda via in basket message regarding exploration of any/all other resources that may be available for patient to offer respite  care as needed . Discussed plans with patient for ongoing care management follow up and provided patient with direct contact information for care management team  Patient Self Care Activities:  . Attends all scheduled provider appointments . Calls provider office for new concerns or questions  Please see past updates related to this goal by clicking on the "Past Updates" button in the selected goal          Follow Up Plan: SW will follow up with patient by phone over the next month  Daneen Schick, BSW, CDP Social Worker, Certified Dementia Practitioner [place] [facility] [place] [facility] [phone]

## 2018-12-01 NOTE — Chronic Care Management (AMB) (Signed)
  Chronic Care Management    Social Work Follow Up Note  12/01/2018 Name: Tammy [name] MRN: [id number] DOB: [birth year]  Tammy [name] is a 85 y.o. year old female who is a primary care patient of [name], [place]. The CCM team was consulted for assistance with care coordination.   Review of patient status, including review of consultants reports, other relevant assessments, and collaboration with appropriate care team members and the patient's provider was performed as part of comprehensive patient evaluation and provision of chronic care management services.    SW placed an outbound call to the patient niece, [name] to assess with care coordination needs.  Outpatient Encounter Medications as of 12/01/2018  Medication Sig  . acetaminophen (TYLENOL) 500 MG tablet Take 500 mg by mouth every 6 (six) hours as needed for mild pain.  [name] amlodipine-olmesartan (AZOR) 10-20 MG tablet TAKE 1 TABLET BY MOUTH DAILY.  . bimatoprost (LUMIGAN) 0.03 % ophthalmic solution Place 1 drop into both eyes at bedtime.  . calcium-vitamin D (OSCAL WITH D) 500-200 MG-UNIT per tablet Take 1 tablet by mouth daily.  . diclofenac sodium (VOLTAREN) 1 % GEL Apply 2 g topically 4 (four) times daily.  [name] donepezil (ARICEPT) 10 MG tablet Take 1 tablet (10 mg total) by mouth at bedtime.  [name] omeprazole (PRILOSEC) 20 MG capsule Take 1 capsule (20 mg total) by mouth daily.   No facility-administered encounter medications on file as of 12/01/2018.      Goals Addressed            This Visit's Progress   . "The VA said the paperwork needs to be adjusted"       POA [name] Stated:  Current Barriers:  . Incomplete paperwork submission . Unable to update forms without physician approval  Clinical Social Work Clinical Goal(s):  [name] Over the next 30 days, the patient and her physician will work with SW to update VA aide and attendance forms. Completed . New 11/16/2018- Over the next 60 days the patient will be  knowledgeable of available resources to assist with VA benefit navigation  CCM SW Interventions: Completed 12/01/2018 . Outbound call to the patients niece and caregiver [name] . Discussed SW recent outreach to [facility] to inquire caregiver resources . Determined Mrs Gorden Harms is not interested in resources at this time stating the patient is doing well  . Encouraged Mrs Gorden Harms to contact CM team in the event she changes her mind . Scheduled follow up call over the next month to assess patient/caregiver need  Patient Self Care Activities:  . Attends all scheduled provider appointments . Calls provider office for new concerns or questions  Please see past updates related to this goal by clicking on the "Past Updates" button in the selected goal          Follow Up Plan: SW will follow up with patient by phone over the next month   Daneen Schick, BSW, CDP Social Worker, Certified Dementia Practitioner [place] [facility] [place] [facility] [phone]  Total time spent performing care coordination and/or care management activities with the patient by phone or face to face = 9 minutes.

## 2018-12-13 ENCOUNTER — Telehealth: Payer: Self-pay

## 2018-12-27 ENCOUNTER — Other Ambulatory Visit: Payer: Self-pay

## 2018-12-27 DIAGNOSIS — I10 Essential (primary) hypertension: Secondary | ICD-10-CM

## 2018-12-27 MED ORDER — AMLODIPINE-OLMESARTAN 10-20 MG PO TABS: 1.0000 | ORAL_TABLET | Freq: Every day | ORAL | 1 refills | Status: DC

## 2019-01-01 ENCOUNTER — Ambulatory Visit: Payer: Medicare Other | Admitting: Nurse Practitioner

## 2019-01-01 ENCOUNTER — Telehealth: Payer: Self-pay | Admitting: *Deleted

## 2019-01-01 NOTE — Telephone Encounter (Signed)
Called and spoke with niece, [name] (on [place]). Advised MD has meeting 12/04/18 at 1130am and wanting to r/s appt to 930am that day or 01/02/19 at 1130am instead. Neither appt worked for them. Pt doing well, no new sx. Rescheduled to 03/15/19 at 4pm with Dr. Felecia Shelling. They will call if they have any questions/concerns prior to appt.

## 2019-01-02 ENCOUNTER — Ambulatory Visit: Payer: Medicare Other

## 2019-01-02 DIAGNOSIS — F0280 Dementia in other diseases classified elsewhere without behavioral disturbance: Secondary | ICD-10-CM

## 2019-01-02 DIAGNOSIS — G301 Alzheimer's disease with late onset: Secondary | ICD-10-CM

## 2019-01-03 ENCOUNTER — Ambulatory Visit: Payer: Medicare Other | Admitting: Neurology

## 2019-01-03 ENCOUNTER — Other Ambulatory Visit: Payer: Self-pay

## 2019-01-03 ENCOUNTER — Ambulatory Visit (INDEPENDENT_AMBULATORY_CARE_PROVIDER_SITE_OTHER): Payer: Medicare Other | Admitting: Nurse Practitioner

## 2019-01-03 ENCOUNTER — Encounter: Payer: Self-pay | Admitting: Nurse Practitioner

## 2019-01-03 VITALS — BP 142/80 | HR 76 | Temp 98.8°F | Ht 66.6 in | Wt 159.2 lb

## 2019-01-03 DIAGNOSIS — G3184 Mild cognitive impairment, so stated: Secondary | ICD-10-CM | POA: Diagnosis not present

## 2019-01-03 DIAGNOSIS — M25561 Pain in right knee: Secondary | ICD-10-CM

## 2019-01-03 DIAGNOSIS — G8929 Other chronic pain: Secondary | ICD-10-CM | POA: Diagnosis not present

## 2019-01-03 DIAGNOSIS — E782 Mixed hyperlipidemia: Secondary | ICD-10-CM

## 2019-01-03 DIAGNOSIS — R450 Nervousness: Secondary | ICD-10-CM | POA: Diagnosis not present

## 2019-01-03 DIAGNOSIS — I10 Essential (primary) hypertension: Secondary | ICD-10-CM

## 2019-01-03 MED ORDER — DONEPEZIL HCL 10 MG PO TABS: 10.0000 mg | ORAL_TABLET | Freq: Every day | ORAL | 4 refills | Status: DC

## 2019-01-03 MED ORDER — MAGNESIUM 250 MG PO TABS: ORAL_TABLET | ORAL | 1 refills | Status: DC

## 2019-01-03 MED ORDER — OMEPRAZOLE 20 MG PO CPDR: 20.0000 mg | DELAYED_RELEASE_CAPSULE | Freq: Every day | ORAL | 1 refills | Status: DC

## 2019-01-03 MED ORDER — AMLODIPINE-OLMESARTAN 10-20 MG PO TABS: 1.0000 | ORAL_TABLET | Freq: Every day | ORAL | 1 refills | Status: DC

## 2019-01-03 MED ORDER — DICLOFENAC SODIUM 1 % EX GEL: 2.0000 g | Freq: Four times a day (QID) | CUTANEOUS | 3 refills | Status: DC

## 2019-01-03 NOTE — Chronic Care Management (AMB) (Signed)
  Chronic Care Management   Social Work Follow Up Note  01/03/2019 Name: Tammy [name] MRN: [id number] DOB: 1933-07-26  Tammy [name] is a 85 y.o. year old female who is a primary care patient of [name], [place]. The CCM team was consulted for assistance with care coordination.   Review of patient status, including review of consultants reports, other relevant assessments, and collaboration with appropriate care team members and the patient's provider was performed as part of comprehensive patient evaluation and provision of chronic care management services.    SW placed an outbound call to the patients niece and caregiver Tammy [name] to follow up on patient care coordination needs. [name] reports the patient is doing well with no new concerns to report.  Outpatient Encounter Medications as of 01/02/2019  Medication Sig  . acetaminophen (TYLENOL) 500 MG tablet Take 500 mg by mouth every 6 (six) hours as needed for mild pain.  . bimatoprost (LUMIGAN) 0.03 % ophthalmic solution Place 1 drop into both eyes at bedtime.  . calcium-vitamin D (OSCAL WITH D) 500-200 MG-UNIT per tablet Take 1 tablet by mouth daily.  . [DISCONTINUED] amlodipine-olmesartan (AZOR) 10-20 MG tablet Take 1 tablet by mouth daily.  . [DISCONTINUED] diclofenac sodium (VOLTAREN) 1 % GEL Apply 2 g topically 4 (four) times daily.  . [DISCONTINUED] donepezil (ARICEPT) 10 MG tablet Take 1 tablet (10 mg total) by mouth at bedtime.  . [DISCONTINUED] omeprazole (PRILOSEC) 20 MG capsule Take 1 capsule (20 mg total) by mouth daily.   No facility-administered encounter medications on file as of 01/02/2019.     Follow Up Plan: SW will follow up with patient by phone over the next 90 days   Tammy [name], BSW, CDP Social Worker, Certified Dementia Practitioner [place] [facility] [place] [facility] [phone]

## 2019-01-03 NOTE — Progress Notes (Signed)
Subjective:     Patient ID: Tammy [name] , female    DOB: [birth year] , 85 y.o.   MRN: [id number]   Chief Complaint  Patient presents with  . Hypertension    HPI   She has    Hypertension This is a chronic problem. The current episode started more than 1 year ago. The problem is unchanged. The problem is controlled. Pertinent negatives include no chest pain, palpitations or shortness of breath. Risk factors for coronary artery disease include sedentary lifestyle. The current treatment provides no improvement. There are no compliance problems.  There is no history of chronic renal disease.     Past Medical History:  Diagnosis Date  . Acid reflux   . Glaucoma   . High cholesterol   . Hypertension      Family History  Problem Relation Age of Onset  . Hypertension Mother   . Hypertension Sister   . Hypertension Brother   . Heart disease Brother      Current Outpatient Medications:  .  acetaminophen (TYLENOL) 500 MG tablet, Take 500 mg by mouth every 6 (six) hours as needed for mild pain., Disp: , Rfl:  .  amlodipine-olmesartan (AZOR) 10-20 MG tablet, Take 1 tablet by mouth daily., Disp: 90 tablet, Rfl: 1 .  bimatoprost (LUMIGAN) 0.03 % ophthalmic solution, Place 1 drop into both eyes at bedtime., Disp: , Rfl:  .  calcium-vitamin D (OSCAL WITH D) 500-200 MG-UNIT per tablet, Take 1 tablet by mouth daily., Disp: , Rfl:  .  diclofenac sodium (VOLTAREN) 1 % GEL, Apply 2 g topically 4 (four) times daily., Disp: 100 g, Rfl: 2 .  donepezil (ARICEPT) 10 MG tablet, Take 1 tablet (10 mg total) by mouth at bedtime., Disp: 90 tablet, Rfl: 4 .  omeprazole (PRILOSEC) 20 MG capsule, Take 1 capsule (20 mg total) by mouth daily., Disp: 90 capsule, Rfl: 1   Allergies  Allergen Reactions  . Advil [Ibuprofen] Itching  . Ciprofloxacin Other (See Comments)    REACTION: hives  . Zolpidem Tartrate Other (See Comments)    REACTION: trembling  . Unisom [Doxylamine] Swelling and Rash      Review of Systems  Constitutional: Negative for chills.  Respiratory: Negative for cough and shortness of breath.   Cardiovascular: Negative for chest pain, palpitations and leg swelling.  Endocrine: Negative for polydipsia, polyphagia and polyuria.     Today's Vitals   01/03/19 0855  BP: (!) 142/80  Pulse: 76  Temp: 98.8 F (37.1 C)  TempSrc: Oral  Weight: 159 lb 3.2 oz (72.2 kg)  Height: 5' 6.6" (1.692 m)  PainSc: 0-No pain   Body mass index is 25.23 kg/m.   Objective:  Physical Exam Vitals signs reviewed.  Constitutional:      Appearance: Normal appearance.  Cardiovascular:     Rate and Rhythm: Normal rate and regular rhythm.     Pulses: Normal pulses.  Neurological:     General: No focal deficit present.     Mental Status: She is alert and oriented to person, place, and time.     Cranial Nerves: No cranial nerve deficit.  Psychiatric:        Mood and Affect: Mood normal.         Assessment And Plan:   1. Essential hypertension  Chronic, blood pressure is slightly elevated  Advised to continue to monitor and drink adequate amounts of water.   - amlodipine-olmesartan (AZOR) 10-20 MG tablet; Take 1 tablet by mouth daily.  Dispense: 90 tablet; Refill: 1 - omeprazole (PRILOSEC) 20 MG capsule; Take 1 capsule (20 mg total) by mouth daily.  Dispense: 90 capsule; Refill: 1  2. Mixed hyperlipidemia  Chronic, controlled  Continue with current medications - Lipid Profile - CMP14+EGFR  3. Mild cognitive impairment  Chronic, stable  She is having some mood changes since her mother passed away - donepezil (ARICEPT) 10 MG tablet; Take 1 tablet (10 mg total) by mouth at bedtime.  Dispense: 90 tablet; Refill: 4  4. Chronic pain of right knee  Encouraged to use her pain cream regularly to help alleviate the pain before it gets extreme to the point of having to take more medication for good effect  5. Nervousness  Niece is concerned about her mood changes and  nervousness  She is to take magnesium with evening meal to start with conservative treatment.  - Magnesium 250 MG TABS; Take 1 tablet by mouth with evening meal  Dispense: 90 tablet; Refill: [place], FNP

## 2019-01-04 LAB — CMP14+EGFR
ALT: 9 IU/L (ref 0–32)
AST: 14 IU/L (ref 0–40)
Albumin/Globulin Ratio: 1.6 (ref 1.2–2.2)
Albumin: 4.2 g/dL (ref 3.6–4.6)
Alkaline Phosphatase: 110 IU/L (ref 39–117)
BUN/Creatinine Ratio: 16 (ref 12–28)
BUN: 14 mg/dL (ref 8–27)
Bilirubin Total: 0.3 mg/dL (ref 0.0–1.2)
CO2: 25 mmol/L (ref 20–29)
Calcium: 9.6 mg/dL (ref 8.7–10.3)
Chloride: 109 mmol/L — ABNORMAL HIGH (ref 96–106)
Creatinine, Ser: 0.88 mg/dL (ref 0.57–1.00)
GFR calc Af Amer: 70 mL/min/{1.73_m2} (ref >59)
GFR calc non Af Amer: 61 mL/min/{1.73_m2} (ref >59)
Globulin, Total: 2.6 g/dL (ref 1.5–4.5)
Glucose: 95 mg/dL (ref 65–99)
Potassium: 4.2 mmol/L (ref 3.5–5.2)
Sodium: 144 mmol/L (ref 134–144)
Total Protein: 6.8 g/dL (ref 6.0–8.5)

## 2019-01-04 LAB — LIPID PANEL
Chol/HDL Ratio: 3.1 ratio (ref 0.0–4.4)
Cholesterol, Total: 193 mg/dL (ref 100–199)
HDL: 63 mg/dL (ref >39)
LDL Chol Calc (NIH): 118 mg/dL — ABNORMAL HIGH (ref 0–99)
Triglycerides: 65 mg/dL (ref 0–149)
VLDL Cholesterol Cal: 12 mg/dL (ref 5–40)

## 2019-01-05 ENCOUNTER — Telehealth: Payer: Self-pay

## 2019-03-15 ENCOUNTER — Telehealth: Payer: Medicare Other | Admitting: Neurology

## 2019-03-15 ENCOUNTER — Encounter: Payer: Self-pay | Admitting: *Deleted

## 2019-03-15 MED ORDER — HYDROXYZINE HCL 25 MG PO TABS: 25.0000 mg | ORAL_TABLET | Freq: Every day | ORAL | 2 refills | Status: DC | PRN

## 2019-03-15 NOTE — Progress Notes (Signed)
I spoke to her niece.  She was under the impression that Tammy [name]'s appointment was canceled today.   We will try to set up another visit.  She did report that Tammy [name] has been agitated sometimes.  I will send in a prescription for hydroxyzine to take if she has agitation.  I did discuss that it might make her sleepy.

## 2019-03-20 ENCOUNTER — Telehealth: Payer: Self-pay | Admitting: *Deleted

## 2019-03-20 NOTE — Telephone Encounter (Signed)
Called, LVM for niece, [name] (primary care taker) to call office to r/s pt appt from 03/15/19  Advised they can schedule with either Dr. Epimenio Foot or Amy L, NP. They would like it to be virtual. Please schedule if she calls back

## 2019-03-21 NOTE — Telephone Encounter (Signed)
Checked and appt made for 04/19/19 at 2:30pm with AL,NP

## 2019-03-22 ENCOUNTER — Ambulatory Visit (INDEPENDENT_AMBULATORY_CARE_PROVIDER_SITE_OTHER): Payer: Medicare Other

## 2019-03-22 DIAGNOSIS — F0280 Dementia in other diseases classified elsewhere without behavioral disturbance: Secondary | ICD-10-CM

## 2019-03-22 DIAGNOSIS — G301 Alzheimer's disease with late onset: Secondary | ICD-10-CM

## 2019-03-23 NOTE — Chronic Care Management (AMB) (Signed)
Chronic Care Management    Social Work Follow Up Note  03/22/2019 Name: Tammy [name] MRN: [id number] DOB: 1933-07-11  IMAJEAN [name] is a 86 y.o. year old female who is a primary care patient of [name], [place]. The CCM team was consulted for assistance with care coordination.   Review of patient status, including review of consultants reports, other relevant assessments, and collaboration with appropriate care team members and the patient's provider was performed as part of comprehensive patient evaluation and provision of chronic care management services.    SDOH (Social Determinants of Health) assessments performed: No    Outpatient Encounter Medications as of 03/22/2019  Medication Sig  . acetaminophen (TYLENOL) 500 MG tablet Take 500 mg by mouth every 6 (six) hours as needed for mild pain.  [name] amlodipine-olmesartan (AZOR) 10-20 MG tablet Take 1 tablet by mouth daily.  . bimatoprost (LUMIGAN) 0.03 % ophthalmic solution Place 1 drop into both eyes at bedtime.  . calcium-vitamin D (OSCAL WITH D) 500-200 MG-UNIT per tablet Take 1 tablet by mouth daily.  . diclofenac Sodium (VOLTAREN) 1 % GEL Apply 2 g topically 4 (four) times daily.  [name] donepezil (ARICEPT) 10 MG tablet Take 1 tablet (10 mg total) by mouth at bedtime.  . hydrOXYzine (ATARAX/VISTARIL) 25 MG tablet Take 1 tablet (25 mg total) by mouth daily as needed. For agitation  . Magnesium 250 MG TABS Take 1 tablet by mouth with evening meal  . omeprazole (PRILOSEC) 20 MG capsule Take 1 capsule (20 mg total) by mouth daily.   No facility-administered encounter medications on file as of 03/22/2019.     Goals Addressed            This Visit's Progress   . COMPLETED: "The VA said the paperwork needs to be adjusted"       POA [name] Stated:  Current Barriers:  . Incomplete paperwork submission . Unable to update forms without physician approval  Clinical Social Work Clinical Goal(s):  [name] Over the next 30 days, the  patient and her physician will work with SW to update VA aide and attendance forms. Completed . New 11/16/2018- Over the next 60 days the patient will be knowledgeable of available resources to assist with VA benefit navigation  CCM SW Interventions: Completed 03/22/2019 with [name] . Outbound call to the patients niece and caregiver [name] to assess progression of patient goal . Determined Tammy [name] has been in contact with [facility] worker and is anticipating a visit from a [place] benefit specialist to review patient benefits once the county is allowed to make home visits due to [place] 19 pandemic  Patient Self Care Activities:  . Attends all scheduled provider appointments . Calls provider office for new concerns or questions  Please see past updates related to this goal by clicking on the "Past Updates" button in the selected goal      . Collaborate with RN Case Manager to assist with care coordination needs       CARE PLAN ENTRY (see longtitudinal plan of care for additional care plan information)  Current Barriers:  . Progression of Alzheimer's Disease resulting in periods of agitation . Occasional difficulty performs ADL's and IADL's independently . Limited caregiver support  Social Work Clinical Goal(s):  [name] Over the next 120 days, the patient and her niece will work with CM team to assist with ongoing care coordination needs  CCM SW Interventions: Completed 03/22/2019 with [name] . Outbound call placed  to the patients caregiver and niece [name] . Determined the patient has stopped taking Aricept due to dreams o Discussed Tammy [name] has contacted the patients neurologist regarding this change and Dr. Epimenio Foot is aware.  o Performed chart review to note new prescription of Vistaril to assist with patient agitation - Tammy [name] reports she has yet to be contacted by the pharmacy regarding medication being available - Outbound call placed to the  patients preferred pharmacy, confirmed prescription is ready for pick up - Confirmed Tammy [name] would plan to pick the medication up for the patient . Reviewed patient needs for a caregiver to assist some throughout the week to give family members respite time o Tammy [name] has contacted [facility] and placed the patient on a wait list for in home aide services. The wait is estimated at 1 year o Determined Tammy [name] plans to apply for Medicaid to establish if the patient would qualify for PCS benefits o Reviewed opportunity to privately hire a caregiver if needed through an agency versus hiring a private duty Financial controller . Determined the patient received her first done of the Pfizer COVID 19 vaccine on 03/21/19 o No adverse reactions noted at this time o Planned second dose for March 25 o Reviewed possibility of adverse reactions and encourage Tammy [name] to contact the patients primary provider as needed o Collaboration with Arnette Felts, FNP to communicate patients updated vaccine history . Discussed Tammy [name] noted on the patients "MyChart" account that the patient is due for a tetanus vaccine o Collaboration with patients primary provider requesting confirmation vaccine is due  o Requested if the vaccine is due, orders be sent to a local pharmacy to administer the vaccine . Scheduled follow up call over the next two weeks to communicate response regarding tetanus vaccine  Patient Self Care Activities:  . Attends all scheduled provider appointments . Calls provider office for new concerns or questions . Supportive family to assist with patient care needs  Initial goal documentation         Follow Up Plan: SW will follow up with patient by phone over the next two weeks.  [name], BSW, CDP Social Worker, Certified Dementia Practitioner [facility] Care Management [phone]  Total time spent performing care coordination and/or care management activities with the  patient by phone or face to face = 24 minutes.

## 2019-03-23 NOTE — Patient Instructions (Signed)
Social Worker Visit Information  Goals we discussed today:  Goals Addressed            This Visit's Progress   . COMPLETED: "The VA said the paperwork needs to be adjusted"       POA [name] Stated:  Current Barriers:  . Incomplete paperwork submission . Unable to update forms without physician approval  Clinical Social Work Clinical Goal(s):  [name] Over the next 30 days, the patient and her physician will work with SW to update VA aide and attendance forms. Completed . New 11/16/2018- Over the next 60 days the patient will be knowledgeable of available resources to assist with VA benefit navigation  CCM SW Interventions: Completed 03/22/2019 with [name] . Outbound call to the patients niece and caregiver [name] to assess progression of patient goal . Determined [name] has been in contact with [facility] worker and is anticipating a visit from a [place] benefit specialist to review patient benefits once the county is allowed to make home visits due to [place] 19 pandemic  Patient Self Care Activities:  . Attends all scheduled provider appointments . Calls provider office for new concerns or questions  Please see past updates related to this goal by clicking on the "Past Updates" button in the selected goal      . Collaborate with RN Case Manager to assist with care coordination needs       CARE PLAN ENTRY (see longtitudinal plan of care for additional care plan information)  Current Barriers:  . Progression of Alzheimer's Disease resulting in periods of agitation . Occasional difficulty performs ADL's and IADL's independently . Limited caregiver support  Social Work Clinical Goal(s):  [name] Over the next 120 days, the patient and her niece will work with CM team to assist with ongoing care coordination needs  CCM SW Interventions: Completed 03/22/2019 with [name] . Outbound call placed to the patients caregiver and niece [name] . Determined  the patient has stopped taking Aricept due to dreams o Discussed [name] has contacted the patients neurologist regarding this change and Dr. Felecia Shelling is aware.  o Performed chart review to note new prescription of Vistaril to assist with patient agitation - [name] reports she has yet to be contacted by the pharmacy regarding medication being available - Outbound call placed to the patients preferred pharmacy, confirmed prescription is ready for pick up - Confirmed [name] would plan to pick the medication up for the patient . Reviewed patient needs for a caregiver to assist some throughout the week to give family members respite time o [name] has contacted [facility] and placed the patient on a wait list for in home aide services. The wait is estimated at 1 year o Determined [name] plans to apply for Medicaid to establish if the patient would qualify for PCS benefits o Reviewed opportunity to privately hire a caregiver if needed through an agency versus hiring a private duty Insurance underwriter . Determined the patient received her first done of the [place] 19 vaccine on 03/21/19 o No adverse reactions noted at this time o Planned second dose for March 25 o Reviewed possibility of adverse reactions and encourage [name] to contact the patients primary provider as needed o Collaboration with [name], FNP to communicate patients updated vaccine history . Discussed [name] noted on the patients "MyChart" account that the patient is due for a tetanus vaccine o Collaboration with patients primary provider requesting  confirmation vaccine is due  o Requested if the vaccine is due, orders be sent to a local pharmacy to administer the vaccine . Scheduled follow up call over the next two weeks to communicate response regarding tetanus vaccine  Patient Self Care Activities:  . Attends all scheduled provider appointments . Calls provider office for new concerns or  questions . Supportive family to assist with patient care needs  Initial goal documentation          Follow Up Plan: SW will follow up with patient by phone over the next two weeks   [name], BSW, CDP Social Worker, Certified Dementia Practitioner [facility] Care Management [phone]

## 2019-03-30 ENCOUNTER — Ambulatory Visit: Payer: Self-pay

## 2019-03-30 DIAGNOSIS — F0280 Dementia in other diseases classified elsewhere without behavioral disturbance: Secondary | ICD-10-CM

## 2019-03-30 NOTE — Chronic Care Management (AMB) (Signed)
Chronic Care Management    Social Work Follow Up Note  03/30/2019 Name: WILLOW SHIDLER MRN: [id number] DOB: March 27, 1933  ADAIR LEMAR is a 86 y.o. year old female who is a primary care patient of Arnette Felts, FNP. The CCM team was consulted for assistance with care coordination.   Review of patient status, including review of consultants reports, other relevant assessments, and collaboration with appropriate care team members and the patient's provider was performed as part of comprehensive patient evaluation and provision of chronic care management services.    SDOH (Social Determinants of Health) assessments performed: No    Outpatient Encounter Medications as of 03/30/2019  Medication Sig  . acetaminophen (TYLENOL) 500 MG tablet Take 500 mg by mouth every 6 (six) hours as needed for mild pain.  [name] amlodipine-olmesartan (AZOR) 10-20 MG tablet Take 1 tablet by mouth daily.  . bimatoprost (LUMIGAN) 0.03 % ophthalmic solution Place 1 drop into both eyes at bedtime.  . calcium-vitamin D (OSCAL WITH D) 500-200 MG-UNIT per tablet Take 1 tablet by mouth daily.  . diclofenac Sodium (VOLTAREN) 1 % GEL Apply 2 g topically 4 (four) times daily.  [name] donepezil (ARICEPT) 10 MG tablet Take 1 tablet (10 mg total) by mouth at bedtime.  . hydrOXYzine (ATARAX/VISTARIL) 25 MG tablet Take 1 tablet (25 mg total) by mouth daily as needed. For agitation  . Magnesium 250 MG TABS Take 1 tablet by mouth with evening meal  . omeprazole (PRILOSEC) 20 MG capsule Take 1 capsule (20 mg total) by mouth daily.   No facility-administered encounter medications on file as of 03/30/2019.     Goals Addressed            This Visit's Progress   . Collaborate with RN Case Manager to assist with care coordination needs       CARE PLAN ENTRY (see longtitudinal plan of care for additional care plan information)  Current Barriers:  . Progression of Alzheimer's Disease resulting in periods of agitation . Occasional  difficulty performs ADL's and IADL's independently . Limited caregiver support  Social Work Clinical Goal(s):  [name] Over the next 120 days, the patient and her niece will work with CM team to assist with ongoing care coordination needs  CCM SW Interventions: Completed 03/30/2019  . Collaboration with patients primary provider who confirms the patient can receive tetanus vaccination from local pharmacy.  o Informed the patient should wait 90 days after COVID 19 vaccination series is complete to receive tetanus vaccine . Successful outbound call placed to the patients niece and caregiver [name] to communicate above information  Completed 03/22/2019 with [name] . Outbound call placed to the patients caregiver and niece [name] . Determined the patient has stopped taking Aricept due to dreams o Discussed [name] has contacted the patients neurologist regarding this change and Dr. Epimenio Foot is aware.  o Performed chart review to note new prescription of Vistaril to assist with patient agitation - [name] reports she has yet to be contacted by the pharmacy regarding medication being available - Outbound call placed to the patients preferred pharmacy, confirmed prescription is ready for pick up - Confirmed [name] would plan to pick the medication up for the patient . Reviewed patient needs for a caregiver to assist some throughout the week to give family members respite time o [name] has contacted [facility] and placed the patient on a wait list for in home aide services. The wait is estimated at 1  year o Determined [name] plans to apply for Medicaid to establish if the patient would qualify for PCS benefits o Reviewed opportunity to privately hire a caregiver if needed through an agency versus hiring a private duty Insurance underwriter . Determined the patient received her first done of the [place] 19 vaccine on 03/21/19 o No adverse reactions noted at this  time o Planned second dose for March 25 o Reviewed possibility of adverse reactions and encourage [name] to contact the patients primary provider as needed o Collaboration with [name], FNP to communicate patients updated vaccine history . Discussed [name] noted on the patients "MyChart" account that the patient is due for a tetanus vaccine o Collaboration with patients primary provider requesting confirmation vaccine is due  o Requested if the vaccine is due, orders be sent to a local pharmacy to administer the vaccine . Scheduled follow up call over the next two weeks to communicate response regarding tetanus vaccine  Patient Self Care Activities:  . Attends all scheduled provider appointments . Calls provider office for new concerns or questions . Supportive family to assist with patient care needs  Initial goal documentation         Follow Up Plan: SW will follow up with patient by phone over the next month   Daneen Schick, BSW, CDP Social Worker, Certified Dementia Practitioner [place] [facility] [place] [facility] [phone]

## 2019-04-19 ENCOUNTER — Encounter: Payer: Self-pay | Admitting: Family Medicine

## 2019-04-19 ENCOUNTER — Telehealth (INDEPENDENT_AMBULATORY_CARE_PROVIDER_SITE_OTHER): Payer: Medicare Other | Admitting: Family Medicine

## 2019-04-19 DIAGNOSIS — F0280 Dementia in other diseases classified elsewhere without behavioral disturbance: Secondary | ICD-10-CM | POA: Diagnosis not present

## 2019-04-19 DIAGNOSIS — G301 Alzheimer's disease with late onset: Secondary | ICD-10-CM | POA: Diagnosis not present

## 2019-04-19 NOTE — Progress Notes (Signed)
I have read the note, and I agree with the clinical assessment and plan.  Evert Wenrich A. Amier Hoyt, MD, PhD, FAAN Certified in Neurology, Clinical Neurophysiology, Sleep Medicine, Pain Medicine and Neuroimaging  [facility] [address], [address] [place], [place] [address] [phone]  

## 2019-04-19 NOTE — Progress Notes (Signed)
PATIENT: Tammy [name] DOB: [birth year]  REASON FOR VISIT: follow up HISTORY FROM: patient  Virtual Visit via Telephone Note  I connected with Tammy [name] on 04/19/19 at  2:30 PM EDT by telephone and verified that I am speaking with the correct person using two identifiers.   I discussed the limitations, risks, security and privacy concerns of performing an evaluation and management service by telephone and the availability of in person appointments. I also discussed with the patient that there may be a patient responsible charge related to this service. The patient expressed understanding and agreed to proceed.   History of Present Illness:  04/19/19 Tammy [name] is a 86 y.o. female here today for follow up for dementia.  She was last seen by Dr. Epimenio Foot in June 2020.  Her niece, Tammy [name], presents on video with her today and aids in history.  [place] reports that she is feeling well.  She feels that her memory is fairly stable.  She has continued Aricept 10 mg daily.  She does report having some dreams but denies that these are frightening or disruptive in nature.  Tammy [name] reports that there has been some agitation in the evenings.  They have started hydroxyzine which has been beneficial.  She is able to perform ADLs independently.  She has recently moved in with her sister.  She does not drive.  There have been no falls.  She does use a cane for stability.   Observations/Objective:  Generalized: Well developed, in no acute distress  Mentation: Alert oriented to time with exception of specific date and year,  place, and most history taking. Follows all commands speech and language fluent Motor: able to move extremities freely on video and without difficulty.    Assessment and Plan:  86 y.o. year old female  has a past medical history of Acid reflux, Glaucoma, High cholesterol, and Hypertension. here with    ICD-10-CM   1. Late onset Alzheimer's disease without behavioral disturbance  (HCC)  G30.1    F02.80    Tammy [name] doing very well today.  She is tolerating Aricept 10 mg.  She does report having some unusual dreams but states these are not frightening or disabling. Tammy [name], her niece, feels that memory is fairly stable.  In fact, it may be somewhat improved since last being seen in June.  She does have some intermittent agitation in the afternoons.  Hydroxyzine has been helpful in managing agitation.  She continues to be active.  We have discussed safety and fall precautions.  She will continue to use her cane as needed for stability.  We have reviewed memory compensation strategies.  She was encouraged to stay mentally and physically active.  Adequate hydration advised.  She will follow-up with me in 6 months.  May consider adding Namenda in the future if needed.  She and her niece both verbalized understanding and agreement with this plan.   No orders of the defined types were placed in this encounter.   No orders of the defined types were placed in this encounter.    Follow Up Instructions:  I discussed the assessment and treatment plan with the patient. The patient was provided an opportunity to ask questions and all were answered. The patient agreed with the plan and demonstrated an understanding of the instructions.   The patient was advised to call back or seek an in-person evaluation if the symptoms worsen or if the condition fails to improve as anticipated.  I provided 20  minutes of non-face-to-face time during this encounter.  Patient and her niece, Tammy [name], are located at patient's place of residence.  Provider is in the office.   Debbora Presto, NP

## 2019-04-24 ENCOUNTER — Ambulatory Visit: Payer: Medicare Other

## 2019-04-24 DIAGNOSIS — F0280 Dementia in other diseases classified elsewhere without behavioral disturbance: Secondary | ICD-10-CM

## 2019-04-24 DIAGNOSIS — G301 Alzheimer's disease with late onset: Secondary | ICD-10-CM

## 2019-04-24 NOTE — Patient Instructions (Signed)
Social Worker Visit Information  Goals we discussed today:  Goals Addressed            This Visit's Progress   . Collaborate with RN Case Manager to assist with care coordination needs       CARE PLAN ENTRY (see longtitudinal plan of care for additional care plan information)  Current Barriers:  . Progression of Alzheimer's Disease resulting in periods of agitation . Occasional difficulty performs ADL's and IADL's independently . Limited caregiver support  Social Work Clinical Goal(s):  Tammy Kitchen Over the next 120 days, the patient and her niece will work with CM team to assist with ongoing care coordination needs  CCM SW Interventions: Completed 04/24/2019 with Cannon [name] . Successful outbound call to the patients caregiver to assist with care coordination needs . Determined the patient had a good neurology follow up with no medication changes at this time as the patient is tolerating Aricept well . Assessed for ongoing agitation- [name] reports the patients symptoms are being managed with vistaril at this time . Informed [name] is in the process of completing a Medicaid application to determine if the patient will qualify for caregiver assistance. The patient also remains on the wait list for in home aide grant services . Scheduled follow up call over the next tow months   Patient Self Care Activities:  . Attends all scheduled provider appointments . Calls provider office for new concerns or questions . Supportive family to assist with patient care needs  Please see past updates related to this goal by clicking on the "Past Updates" button in the selected goal          Follow Up Plan: SW will follow up with patient by phone over the next two months.   Tammy [name], BSW, CDP Social Worker, Certified Dementia Practitioner Tammy [name] [phone]

## 2019-04-24 NOTE — Chronic Care Management (AMB) (Signed)
Chronic Care Management    Social Work Follow Up Note  04/24/2019 Name: Tammy [name] MRN: [id number] DOB: 1933-07-15  Tammy [name] is a 86 y.o. year old female who is a primary care patient of Arnette Felts, FNP. The CCM team was consulted for assistance with care coordination.   Review of patient status, including review of consultants reports, other relevant assessments, and collaboration with appropriate care team members and the patient's provider was performed as part of comprehensive patient evaluation and provision of chronic care management services.    SDOH (Social Determinants of Health) assessments performed: No    Outpatient Encounter Medications as of 04/24/2019  Medication Sig  . acetaminophen (TYLENOL) 500 MG tablet Take 500 mg by mouth every 6 (six) hours as needed for mild pain.  [name] amlodipine-olmesartan (AZOR) 10-20 MG tablet Take 1 tablet by mouth daily.  . bimatoprost (LUMIGAN) 0.03 % ophthalmic solution Place 1 drop into both eyes at bedtime.  . calcium-vitamin D (OSCAL WITH D) 500-200 MG-UNIT per tablet Take 1 tablet by mouth daily.  . diclofenac Sodium (VOLTAREN) 1 % GEL Apply 2 g topically 4 (four) times daily.  [name] donepezil (ARICEPT) 10 MG tablet Take 1 tablet (10 mg total) by mouth at bedtime.  . hydrOXYzine (ATARAX/VISTARIL) 25 MG tablet Take 1 tablet (25 mg total) by mouth daily as needed. For agitation  . Magnesium 250 MG TABS Take 1 tablet by mouth with evening meal  . omeprazole (PRILOSEC) 20 MG capsule Take 1 capsule (20 mg total) by mouth daily.   No facility-administered encounter medications on file as of 04/24/2019.     Goals Addressed            This Visit's Progress   . Collaborate with RN Case Manager to assist with care coordination needs       CARE PLAN ENTRY (see longtitudinal plan of care for additional care plan information)  Current Barriers:  . Progression of Alzheimer's Disease resulting in periods of agitation . Occasional  difficulty performs ADL's and IADL's independently . Limited caregiver support  Social Work Clinical Goal(s):  [name] Over the next 120 days, the patient and her niece will work with CM team to assist with ongoing care coordination needs  CCM SW Interventions: Completed 04/24/2019 with [name] . Successful outbound call to the patients caregiver to assist with care coordination needs . Determined the patient had a good neurology follow up with no medication changes at this time as the patient is tolerating Aricept well . Assessed for ongoing agitation- [name] reports the patients symptoms are being managed with vistaril at this time . Informed [name] is in the process of completing a Medicaid application to determine if the patient will qualify for caregiver assistance. The patient also remains on the wait list for in home aide grant services . Scheduled follow up call over the next tow months   Patient Self Care Activities:  . Attends all scheduled provider appointments . Calls provider office for new concerns or questions . Supportive family to assist with patient care needs  Please see past updates related to this goal by clicking on the "Past Updates" button in the selected goal          Follow Up Plan: SW will follow up with patient by phone over the next two months   [name], BSW, CDP Social Worker, Certified Dementia Practitioner [facility] Care Management [phone]  Total time spent performing care coordination and/or care  management activities with the patient by phone or face to face = 8 minutes.

## 2019-04-27 DIAGNOSIS — N390 Urinary tract infection, site not specified: Secondary | ICD-10-CM | POA: Diagnosis not present

## 2019-04-27 DIAGNOSIS — R61 Generalized hyperhidrosis: Secondary | ICD-10-CM | POA: Diagnosis not present

## 2019-04-27 DIAGNOSIS — R0602 Shortness of breath: Secondary | ICD-10-CM | POA: Diagnosis not present

## 2019-04-27 DIAGNOSIS — R110 Nausea: Secondary | ICD-10-CM | POA: Diagnosis not present

## 2019-04-27 DIAGNOSIS — K2100 Gastro-esophageal reflux disease with esophagitis, without bleeding: Secondary | ICD-10-CM | POA: Diagnosis not present

## 2019-04-27 DIAGNOSIS — R7303 Prediabetes: Secondary | ICD-10-CM | POA: Diagnosis not present

## 2019-04-27 DIAGNOSIS — R5383 Other fatigue: Secondary | ICD-10-CM | POA: Diagnosis not present

## 2019-04-30 ENCOUNTER — Other Ambulatory Visit: Payer: Self-pay

## 2019-04-30 DIAGNOSIS — I10 Essential (primary) hypertension: Secondary | ICD-10-CM

## 2019-04-30 MED ORDER — OMEPRAZOLE 20 MG PO CPDR: 20.0000 mg | DELAYED_RELEASE_CAPSULE | Freq: Every day | ORAL | 1 refills | Status: DC

## 2019-04-30 MED ORDER — AMLODIPINE-OLMESARTAN 10-20 MG PO TABS: 1.0000 | ORAL_TABLET | Freq: Every day | ORAL | 1 refills | Status: DC

## 2019-05-04 ENCOUNTER — Encounter: Payer: Self-pay | Admitting: Nurse Practitioner

## 2019-05-07 ENCOUNTER — Other Ambulatory Visit: Payer: Self-pay

## 2019-05-07 ENCOUNTER — Encounter: Payer: Self-pay | Admitting: Nurse Practitioner

## 2019-05-07 ENCOUNTER — Ambulatory Visit (INDEPENDENT_AMBULATORY_CARE_PROVIDER_SITE_OTHER): Payer: Medicare Other | Admitting: Nurse Practitioner

## 2019-05-07 VITALS — BP 142/84 | HR 70 | Temp 97.9°F | Ht 65.4 in | Wt 157.6 lb

## 2019-05-07 DIAGNOSIS — N390 Urinary tract infection, site not specified: Secondary | ICD-10-CM

## 2019-05-07 DIAGNOSIS — E782 Mixed hyperlipidemia: Secondary | ICD-10-CM | POA: Diagnosis not present

## 2019-05-07 DIAGNOSIS — G8929 Other chronic pain: Secondary | ICD-10-CM

## 2019-05-07 DIAGNOSIS — M25561 Pain in right knee: Secondary | ICD-10-CM | POA: Diagnosis not present

## 2019-05-07 DIAGNOSIS — I10 Essential (primary) hypertension: Secondary | ICD-10-CM | POA: Diagnosis not present

## 2019-05-07 NOTE — Progress Notes (Signed)
This visit occurred during the SARS-CoV-2 public health emergency.  Safety protocols were in place, including screening questions prior to the visit, additional usage of staff PPE, and extensive cleaning of exam room while observing appropriate contact time as indicated for disinfecting solutions.  Subjective:     Patient ID: Tammy [name] , female    DOB: 1933-10-29 , 86 y.o.   MRN: [id number]   Chief Complaint  Patient presents with  . Hypertension    HPI  Knee pain continues even with use of pain cream and would like a referral to Dr. Cleophas Dunker. Had covid vaccine 2 weeks ago  Had urinary tract infection on April 3rd - completed antibiotics.  Symptoms have improved.  Urgent Care in [place]  Hypertension This is a chronic problem. The current episode started more than 1 year ago. The problem is unchanged. The problem is controlled. Pertinent negatives include no chest pain, headaches, palpitations or shortness of breath. Risk factors for coronary artery disease include sedentary lifestyle. The current treatment provides no improvement. There are no compliance problems.  There is no history of chronic renal disease.  Knee Pain  The incident occurred more than 1 week ago. The pain is present in the right knee. The quality of the pain is described as aching. The symptoms are aggravated by movement. Treatments tried: pain cream.     Past Medical History:  Diagnosis Date  . Acid reflux   . Glaucoma   . High cholesterol   . Hypertension      Family History  Problem Relation Age of Onset  . Hypertension Mother   . Hypertension Sister   . Hypertension Brother   . Heart disease Brother      Current Outpatient Medications:  .  acetaminophen (TYLENOL) 500 MG tablet, Take 500 mg by mouth every 6 (six) hours as needed for mild pain., Disp: , Rfl:  .  amlodipine-olmesartan (AZOR) 10-20 MG tablet, Take 1 tablet by mouth daily., Disp: 90 tablet, Rfl: 1 .  bimatoprost (LUMIGAN) 0.03 %  ophthalmic solution, Place 1 drop into both eyes at bedtime., Disp: , Rfl:  .  diclofenac Sodium (VOLTAREN) 1 % GEL, Apply 2 g topically 4 (four) times daily., Disp: 100 g, Rfl: 3 .  hydrOXYzine (ATARAX/VISTARIL) 25 MG tablet, Take 1 tablet (25 mg total) by mouth daily as needed. For agitation, Disp: 30 tablet, Rfl: 2 .  Magnesium 250 MG TABS, Take 1 tablet by mouth with evening meal, Disp: 90 tablet, Rfl: 1 .  omeprazole (PRILOSEC) 20 MG capsule, Take 1 capsule (20 mg total) by mouth daily., Disp: 90 capsule, Rfl: 1 .  calcium-vitamin D (OSCAL WITH D) 500-200 MG-UNIT per tablet, Take 1 tablet by mouth daily., Disp: , Rfl:  .  donepezil (ARICEPT) 10 MG tablet, Take 1 tablet (10 mg total) by mouth at bedtime. (Patient not taking: Reported on 05/07/2019), Disp: 90 tablet, Rfl: 4   Allergies  Allergen Reactions  . Advil [Ibuprofen] Itching  . Ciprofloxacin Other (See Comments)    REACTION: hives  . Zolpidem Tartrate Other (See Comments)    REACTION: trembling  . Unisom [Doxylamine] Swelling and Rash     Review of Systems  Constitutional: Negative.  Negative for fatigue.  Respiratory: Negative for shortness of breath.   Cardiovascular: Negative for chest pain, palpitations and leg swelling.  Musculoskeletal:       She is using a cane now to walk and unable to see between her knees. Right knee with medial swelling  Neurological: Negative for dizziness and headaches.  Psychiatric/Behavioral: Negative.      Today's Vitals   05/07/19 1030  BP: (!) 142/84  Pulse: 70  Temp: 97.9 F (36.6 C)  Weight: 157 lb 9.6 oz (71.5 kg)  Height: 5' 5.4" (1.661 m)   Body mass index is 25.91 kg/m.   Objective:  Physical Exam Pulmonary:     Effort: Pulmonary effort is normal. No respiratory distress.     Breath sounds: Normal breath sounds.  Musculoskeletal:        General: Swelling (right medical knee) and tenderness present.  Skin:    General: Skin is warm and dry.     Capillary Refill:  Capillary refill takes less than 2 seconds.  Neurological:     General: No focal deficit present.     Mental Status: She is oriented to person, place, and time.         Assessment And Plan:     1. Chronic pain of right knee  She is having to use her cane more and now has swelling to right medial knee   Will refer to Dr Joni Fears - Ambulatory referral to Orthopedic Surgery  2. Urinary tract infection without hematuria, site unspecified  She was treated for a UTI approximately one week ago  Will recheck urinalysis - POCT Urinalysis Dipstick (81002)  3. Essential hypertension  Chronic, slightly elevated she has not taken her medication at this time  4. Mixed hyperlipidemia  Chronic, stable  No labs this visit      [name], FNP    THE PATIENT IS ENCOURAGED TO PRACTICE SOCIAL DISTANCING DUE TO THE COVID-19 PANDEMIC.

## 2019-05-08 LAB — POCT URINALYSIS DIPSTICK
Bilirubin, UA: NEGATIVE
Blood, UA: NEGATIVE
Glucose, UA: NEGATIVE
Ketones, UA: NEGATIVE
Nitrite, UA: NEGATIVE
Protein, UA: POSITIVE — AB
Spec Grav, UA: 1.02 (ref 1.010–1.025)
Urobilinogen, UA: 0.2 E.U./dL
pH, UA: 7.5 (ref 5.0–8.0)

## 2019-05-29 ENCOUNTER — Other Ambulatory Visit: Payer: Self-pay

## 2019-05-29 ENCOUNTER — Ambulatory Visit (INDEPENDENT_AMBULATORY_CARE_PROVIDER_SITE_OTHER): Payer: Medicare Other | Admitting: Orthopaedic Surgery

## 2019-05-29 ENCOUNTER — Encounter: Payer: Self-pay | Admitting: Orthopaedic Surgery

## 2019-05-29 DIAGNOSIS — M1711 Unilateral primary osteoarthritis, right knee: Secondary | ICD-10-CM | POA: Diagnosis not present

## 2019-05-29 MED ORDER — METHYLPREDNISOLONE ACETATE 40 MG/ML IJ SUSP
80.0000 mg | INTRAMUSCULAR | Status: AC | PRN
  Administered 2019-05-29: 80 mg via INTRA_ARTICULAR

## 2019-05-29 MED ORDER — BUPIVACAINE HCL 0.5 % IJ SOLN
2.0000 mL | INTRAMUSCULAR | Status: AC | PRN
  Administered 2019-05-29: 2 mL via INTRA_ARTICULAR

## 2019-05-29 MED ORDER — LIDOCAINE HCL 1 % IJ SOLN
2.0000 mL | INTRAMUSCULAR | Status: AC | PRN
  Administered 2019-05-29: 2 mL

## 2019-05-29 NOTE — Progress Notes (Signed)
Office Visit Note   Patient: Tammy [name]           Date of Birth: 1933-09-26           MRN: [id number] Visit Date: 05/29/2019              Requested by: [name], [address] [place],  [place] [address] PCP: [name], FNP   Assessment & Plan: Visit Diagnoses:  1. Unilateral primary osteoarthritis, right knee     Plan: Advanced osteoarthritis right knee.  Long discussion with Tammy [name] regarding the x-ray and what she can expect over time.  We will continue with the pullover knee support and Tylenol.  Will inject the knee with cortisone.  Monitor response and consider viscosupplementation in the future.  I have also discussed knee replacement at some point Follow-Up Instructions: Return if symptoms worsen or fail to improve.   Orders:  No orders of the defined types were placed in this encounter.  No orders of the defined types were placed in this encounter.     Procedures: Large Joint Inj: R knee on 05/29/2019 4:04 PM Indications: pain and diagnostic evaluation Details: 25 G 1.5 in needle, anteromedial approach  Arthrogram: No  Medications: 2 mL lidocaine 1 %; 2 mL bupivacaine 0.5 %; 80 mg methylPREDNISolone acetate 40 MG/ML Procedure, treatment alternatives, risks and benefits explained, specific risks discussed. Consent was given by the patient. Immediately prior to procedure a time out was called to verify the correct patient, procedure, equipment, support staff and site/side marked as required. Patient was prepped and draped in the usual sterile fashion.       Clinical Data: No additional findings.   Subjective: Chief Complaint  Patient presents with  . Right Knee - Pain  Patient presents today for right knee pain. She fell a year ago and it has been hurting on and off since then, but states that it started hurting worse in the last three months. Her pain is located anteriorly. She said that it hurts most of the time, but worse with  weightbearing. She said that it swells, gives way, and grinds. She walks with the assistance of a cane. She takes Tylenol for pain. No previous surgery to her right knee.  Has a pullover knee support at home that "helps". Films of her right knee were obtained through her primary care physician's office in August 2020.  I reviewed these on the PACS system.  There are advanced tricompartmental degenerative changes with subchondral sclerosis and peripheral osteophytes.  Decreased bone density.  Normal alignment HPI  Review of Systems  Constitutional: Negative for fatigue.  HENT: Negative for ear pain.   Eyes: Negative for pain.  Respiratory: Negative for shortness of breath.   Cardiovascular: Negative for leg swelling.  Gastrointestinal: Negative for constipation and diarrhea.  Endocrine: Negative for cold intolerance and heat intolerance.  Genitourinary: Negative for difficulty urinating.  Musculoskeletal: Positive for joint swelling.  Skin: Negative for rash.  Allergic/Immunologic: Negative for food allergies.  Neurological: Positive for weakness.  Hematological: Does not bruise/bleed easily.  Psychiatric/Behavioral: Negative for sleep disturbance.     Objective: Vital Signs: Ht 5\' 7"  (1.702 m)   Wt 140 lb (63.5 kg)   BMI 21.93 kg/m   Physical Exam Constitutional:      Appearance: She is well-developed.  Eyes:     Pupils: Pupils are equal, round, and reactive to light.  Pulmonary:     Effort: Pulmonary effort is normal.  Skin:  General: Skin is warm and dry.  Neurological:     Mental Status: She is alert and oriented to person, place, and time.  Psychiatric:        Behavior: Behavior normal.     Ortho Exam awake alert and oriented x3.  Comfortable sitting.  With weightbearing right knee has increased valgus.  Small effusion.  Mostly lateral joint pain but some patellar crepitation with mild discomfort and even mild medial joint pain.  No instability.  No popliteal mass or  pain.  No calf pain.  Straight leg raise negative.  Painless range of motion right hip  Specialty Comments:  No specialty comments available.  Imaging: No results found.   PMFS History: Patient Active Problem List   Diagnosis Date Noted  . Unilateral primary osteoarthritis, right knee 05/29/2019  . Gait disturbance 07/04/2018  . Right knee pain 07/04/2018  . Alzheimer disease (HCC) 07/04/2018  . Iron deficiency 11/23/2017  . Mixed hyperlipidemia 11/23/2017  . Fatigue 11/23/2017  . Memory loss 11/23/2017  . History of falling 11/23/2017  . Chest pain 10/17/2016  . Leukocytosis 10/17/2016  . Diverticulosis of large intestine with hemorrhage   . Lower GI bleed   . Rectal bleeding 09/26/2015  . Essential hypertension 09/26/2015  . Acute blood loss anemia 09/26/2015  . Hypokalemia 09/26/2015  . HIP REPLACEMENT, RIGHT, HX OF 05/27/2008  . VITAMIN D DEFICIENCY 05/13/2008  . GERD 05/13/2008  . OSTEOARTHRITIS, HIPS, BILATERAL 05/13/2008   Past Medical History:  Diagnosis Date  . Acid reflux   . Glaucoma   . High cholesterol   . Hypertension     Family History  Problem Relation Age of Onset  . Hypertension Mother   . Hypertension Sister   . Hypertension Brother   . Heart disease Brother     Past Surgical History:  Procedure Laterality Date  . ABDOMINAL HYSTERECTOMY    . COLONOSCOPY N/A 09/30/2015   Procedure: COLONOSCOPY;  Surgeon: Jeani Hawking, MD;  Location: [facility];  Service: Endoscopy;  Laterality: N/A;  . JOINT REPLACEMENT    . REVISION TOTAL HIP ARTHROPLASTY Bilateral    2011 and 2012   Social History   Occupational History  . Occupation: Retired  Tobacco Use  . Smoking status: Never Smoker  . Smokeless tobacco: Never Used  Substance and Sexual Activity  . Alcohol use: No  . Drug use: No  . Sexual activity: Not Currently

## 2019-05-30 ENCOUNTER — Telehealth: Payer: Medicare Other

## 2019-05-30 ENCOUNTER — Ambulatory Visit (INDEPENDENT_AMBULATORY_CARE_PROVIDER_SITE_OTHER): Payer: Medicare Other

## 2019-05-30 DIAGNOSIS — F0280 Dementia in other diseases classified elsewhere without behavioral disturbance: Secondary | ICD-10-CM | POA: Diagnosis not present

## 2019-05-30 DIAGNOSIS — I10 Essential (primary) hypertension: Secondary | ICD-10-CM | POA: Diagnosis not present

## 2019-05-30 DIAGNOSIS — E782 Mixed hyperlipidemia: Secondary | ICD-10-CM

## 2019-05-30 DIAGNOSIS — G301 Alzheimer's disease with late onset: Secondary | ICD-10-CM | POA: Diagnosis not present

## 2019-06-01 NOTE — Patient Instructions (Signed)
Visit Information  Goals Addressed    . COMPLETED: "My aunt's memory is getting worse       Niece stated  Current Barriers:  [name] Knowledge Deficits related to disease process for Dementia and best options for Caregiver/Self Health management of this condition  . Chronic Disease Management support and education needs related to HTN, memory loss, hyperlipidemia . Cognitive Deficits  Nurse Case Manager Clinical Goal(s):  [name] Over the next 30 days, niece Joelene Millin [facility]) will verbalize understanding of the plan for patient to follow up with a Neurologist. Completed . Over the next 60 days, niece Joelene Millin will have an increased understanding of the disease process related to Dementia and knowing what to expect. Completed 07/06/18 . Over the next 90 days, niece Joelene Millin will have a plan in place for long term care of the patient. Completed 11/27/18 . New - 11/27/18 Over the next 90 days, patient will remain Neurologically and Cognitively stable and without ED visits and or IP admissions secondary to Alzheimer's Dementia Goal Met  CCM RN CM Interventions:  05/30/19: completed call with niece and POA Dorann Ou . Evaluation of current treatment plan related to Alzheimer's Dementia and patient's adherence to plan as established by provider. . Reviewed medications with patient and discussed  niece Joelene Millin feels Ms. Houpt is stable in her Dementia disease process at this time and continues to rely on niece Joelene Millin and her sister [name] for caregiver assistance, she remains to be living with her sister [name] . Discussed plans with patient for ongoing care management follow up and provided patient with direct contact information for care management team . Discussed patient continues to have her Dementia managed by Neurologist Dr. Arlice Colt with no future appointments scheduled at this time; discussed Joelene Millin has the contact information needed for this provider and will call when appropriate  Patient  Self Care Activities:  . Currently UNABLE TO independently perform [place]  (sister [name] and niece Joelene Millin are assisting with all care needs and transportation)  Please see past updates related to this goal by clicking on the "Past Updates" button in the selected goal       . "to have less pain her knees"       Niece stated  [place] (see longitudinal plan of care for additional care plan information)  Current Barriers:  [name] Knowledge Deficits related to evaluation and treatment of Osteoarthritis  . Chronic Disease Management support and education needs related to OA, HTN, memory loss, hyperlipidemia  Nurse Case Manager Clinical Goal(s):  [name] Over the next 90 days, patient will work with CCM RN CM and Orthopedic Specialist  to address needs related to disease education and support for improved Self Health management of   CCM RN CM Interventions:  05/30/19 call completed with niece Joelene Millin . Inter-disciplinary care team collaboration (see longitudinal plan of care) . Evaluation of current treatment plan related to Osteoarthritis and patient's adherence to plan as established by provider . Determined patient completed an OV with Dr. Durward Fortes, Orthopedics on 05/29/19 for unilateral right knee pain secondary to OA . Discussed and reviewed the following Assessment/Treatment per Dr. Durward Fortes: o Visit Diagnoses:  o Unilateral primary osteoarthritis, right knee o Plan: Advanced osteoarthritis right knee.  Long discussion with Mrs.Rosch regarding the x-ray and what she can expect over time.  We will continue with the pullover knee support and Tylenol.  Will inject the knee with cortisone.  Monitor response and consider viscosupplementation in the future.  I  have also discussed knee replacement at some point o Follow-Up Instructions: Return if symptoms worsen or fail to improve.  o  Procedures: o Large Joint Inj: R knee on 05/29/2019 4:04 PM o Indications: pain and diagnostic  evaluation o Details: 25 G 1.5 in needle, anteromedial approach . Determined patient is getting good effectiveness from this treatment as evidence by decreased c/o pain and improved ROM to her right knee . Discussed plans with patient for ongoing care management follow up and provided patient with direct contact information for care management team  Patient Self Care Activities:  . Attends all scheduled provider appointments . Calls provider office for new concerns or questions . Supportive family to assist with patient care needs  Initial goal documentation     . "to keep her BP under good control"       Niece stated CARE PLAN ENTRY (see longitudinal plan of care for additional care plan information)  Current Barriers:  . Knowledge Deficits related to disease process and Self Health management of HTN . Chronic Disease Management support and education needs related to HTN, memory loss, hyperlipidemia  Nurse Case Manager Clinical Goal(s):  . Over the next 90 days, patient will work with CCM RN CM and PCP to address needs related to disease education and support for improved Self Health management of HTN  CCM RN CM Interventions:  05/30/19 call completed with niece Kimberly . Inter-disciplinary care team collaboration (see longitudinal plan of care) . Evaluation of current treatment plan related to HTN and patient's adherence to plan as established by provider. . Provided education to patient re: importance of keeping BP well controlled; Educated on target BP <130/80; Reviewed most recent BP readings during OV f/u appts . Reviewed medications with patient and discussed indication, dosage and frequency of prescribed medications for HTN; patient is administering with supervision of niece and sister, reports adherence w/o concerns at this time or noted SE . Discussed plans with patient for ongoing care management follow up and provided patient with direct contact information for care  management team . Advised patient, providing education and rationale, to monitor blood pressure daily and record, calling CCM RN CM and PCP for findings outside established parameters.  . Provided patient with printed  educational materials related to What is High Blood Pressure?; African Americans and High Blood Pressure; Why Should I Restrict Sodium?; High Blood Pressure and Stroke; BP Log; Life's Simple 7  Patient Self Care Activities:  . Attends all scheduled provider appointments . Calls provider office for new concerns or questions . Supportive family to assist with patient care needs  Initial goal documentation       Patient verbalizes understanding of instructions provided today.   Telephone follow up appointment with care management team member scheduled for: 07/11/19   , RN, BSN, CCM Care Management Coordinator [facility]  Direct Phone: [phone]    

## 2019-06-01 NOTE — Chronic Care Management (AMB) (Signed)
Chronic Care Management   Follow Up Note   05/30/2019 Name: Tammy [name] MRN: [id number] DOB: 02-Dec-1933  Referred by: Tammy Brine, FNP Reason for referral : Chronic Care Management ([facility] RN CCM Call )   Tammy [name] is a 86 y.o. year old female who is a primary care patient of Tammy [name], [place]. The CCM team was consulted for assistance with chronic disease management and care coordination needs.    Review of patient status, including review of consultants reports, relevant laboratory and other test results, and collaboration with appropriate care team members and the patient's provider was performed as part of comprehensive patient evaluation and provision of chronic care management services.    SDOH (Social Determinants of Health) assessments performed: Yes - No Acute Challenges identified at this time See Care Plan activities for detailed interventions related to Tammy [name])   Placed outbound CCM RN CM follow up call to niece Tammy [name] for a CCM patient update.     Outpatient Encounter Medications as of 05/30/2019  Medication Sig  . acetaminophen (TYLENOL) 500 MG tablet Take 500 mg by mouth every 6 (six) hours as needed for mild pain.  [name] amlodipine-olmesartan (AZOR) 10-20 MG tablet Take 1 tablet by mouth daily.  . bimatoprost (LUMIGAN) 0.03 % ophthalmic solution Place 1 drop into both eyes at bedtime.  . calcium-vitamin D (OSCAL WITH D) 500-200 MG-UNIT per tablet Take 1 tablet by mouth daily.  . diclofenac Sodium (VOLTAREN) 1 % GEL Apply 2 g topically 4 (four) times daily.  [name] donepezil (ARICEPT) 10 MG tablet Take 1 tablet (10 mg total) by mouth at bedtime.  . hydrOXYzine (ATARAX/VISTARIL) 25 MG tablet Take 1 tablet (25 mg total) by mouth daily as needed. For agitation  . Magnesium 250 MG TABS Take 1 tablet by mouth with evening meal  . omeprazole (PRILOSEC) 20 MG capsule Take 1 capsule (20 mg total) by mouth daily.   No facility-administered encounter medications on file  as of 05/30/2019.     Objective:  Lab Results  Component Value Date   HGBA1C 5.6 08/24/2017   Lab Results  Component Value Date   MICROALBUR 80 08/31/2018   LDLCALC 118 (H) 01/03/2019   CREATININE 0.88 01/03/2019   BP Readings from Last 3 Encounters:  05/07/19 (!) 142/84  01/03/19 (!) 142/80  08/30/18 130/82    Goals Addressed    . COMPLETED: "My aunt's memory is getting worse       Niece stated  Current Barriers:  [name] Knowledge Deficits related to disease process for Dementia and best options for Caregiver/Self Health management of this condition  . Chronic Disease Management support and education needs related to HTN, memory loss, hyperlipidemia . Cognitive Deficits  Nurse Case Manager Clinical Goal(s):  [name] Over the next 30 days, niece Tammy [name] [facility]) will verbalize understanding of the plan for patient to follow up with a Neurologist. Completed . Over the next 60 days, niece Tammy [name] will have an increased understanding of the disease process related to Dementia and knowing what to expect. Completed 07/06/18 . Over the next 90 days, niece Tammy [name] will have a plan in place for long term care of the patient. Completed 11/27/18 . New - 11/27/18 Over the next 90 days, patient will remain Neurologically and Cognitively stable and without ED visits and or IP admissions secondary to Alzheimer's Dementia Goal Met  CCM RN CM Interventions:  05/30/19: completed call with niece and POA Tammy [name] . Evaluation of current treatment plan related to  Alzheimer's Dementia and patient's adherence to plan as established by provider. . Reviewed medications with patient and discussed  niece Tammy [name] feels [name] is stable in her Dementia disease process at this time and continues to rely on niece Tammy [name] and her sister Tammy [name] for caregiver assistance, she remains to be living with her sister Tammy [name] . Discussed plans with patient for ongoing care management follow up and provided patient with  direct contact information for care management team . Discussed patient continues to have her Dementia managed by Neurologist Tammy [name] with no future appointments scheduled at this time; discussed Tammy [name] has the contact information needed for this provider and will call when appropriate  Patient Self Care Activities:  . Currently UNABLE TO independently perform [place]  (sister Tammy [name] and niece Tammy [name] are assisting with all care needs and transportation)  Please see past updates related to this goal by clicking on the "Past Updates" button in the selected goal       . "to have less pain her knees"       Niece stated  [place] [facility] [place] (see longitudinal plan of care for additional care plan information)  Current Barriers:  [name] Knowledge Deficits related to evaluation and treatment of Osteoarthritis  . Chronic Disease Management support and education needs related to OA, HTN, memory loss, hyperlipidemia  Nurse Case Manager Clinical Goal(s):  [name] Over the next 90 days, patient will work with CCM RN CM and Orthopedic Specialist  to address needs related to disease education and support for improved Self Health management of   CCM RN CM Interventions:  05/30/19 call completed with niece Tammy [name] . Inter-disciplinary care team collaboration (see longitudinal plan of care) . Evaluation of current treatment plan related to Osteoarthritis and patient's adherence to plan as established by provider . Determined patient completed an OV with Tammy [name], Orthopedics on 05/29/19 for unilateral right knee pain secondary to OA . Discussed and reviewed the following Assessment/Treatment per Tammy [name]: o Visit Diagnoses:  o Unilateral primary osteoarthritis, right knee o Plan: Advanced osteoarthritis right knee.  Long discussion with Mrs.Delashmit regarding the x-ray and what she can expect over time.  We will continue with the pullover knee support and Tylenol.  Will inject the knee  with cortisone.  Monitor response and consider viscosupplementation in the future.  I have also discussed knee replacement at some point o Follow-Up Instructions: Return if symptoms worsen or fail to improve.  o  Procedures: o Large Joint Inj: R knee on 05/29/2019 4:04 PM o Indications: pain and diagnostic evaluation o Details: 25 G 1.5 in needle, anteromedial approach . Determined patient is getting good effectiveness from this treatment as evidence by decreased c/o pain and improved ROM to her right knee . Discussed plans with patient for ongoing care management follow up and provided patient with direct contact information for care management team  Patient Self Care Activities:  . Attends all scheduled provider appointments . Calls provider office for new concerns or questions . Supportive family to assist with patient care needs  Initial goal documentation     . "to keep her BP under good control"       Niece stated [place] (see longitudinal plan of care for additional care plan information)  Current Barriers:  [name] Knowledge Deficits related to disease process and Self Health management of HTN . Chronic Disease Management support and education needs related to HTN, memory loss, hyperlipidemia  Nurse Case Manager Clinical Goal(s):  .  Over the next 90 days, patient will work with CCM RN CM and PCP to address needs related to disease education and support for improved Self Health management of HTN  CCM RN CM Interventions:  05/30/19 call completed with niece Tammy [name] . Inter-disciplinary care team collaboration (see longitudinal plan of care) . Evaluation of current treatment plan related to HTN and patient's adherence to plan as established by provider. . Provided education to patient re: importance of keeping BP well controlled; Educated on target BP <130/80; Reviewed most recent BP readings during OV f/u appts . Reviewed medications with patient and discussed indication,  dosage and frequency of prescribed medications for HTN; patient is administering with supervision of niece and sister, reports adherence w/o concerns at this time or noted SE . Discussed plans with patient for ongoing care management follow up and provided patient with direct contact information for care management team . Advised patient, providing education and rationale, to monitor blood pressure daily and record, calling CCM RN CM and PCP for findings outside established parameters.  . Provided patient with printed  educational materials related to What is High Blood Pressure?; African Americans and High Blood Pressure; Why Should I Restrict Sodium?; High Blood Pressure and Stroke; BP Log; Life's Simple 7  Patient Self Care Activities:  . Attends all scheduled provider appointments . Calls provider office for new concerns or questions . Supportive family to assist with patient care needs   Initial goal documentation        Plan:   Telephone follow up appointment with care management team member scheduled for: 07/11/19  Barb Merino, RN, BSN, CCM Care Management Coordinator [place] [facility]  Direct Phone: [phone]

## 2019-06-06 ENCOUNTER — Ambulatory Visit: Payer: Medicare Other

## 2019-06-06 DIAGNOSIS — F0280 Dementia in other diseases classified elsewhere without behavioral disturbance: Secondary | ICD-10-CM

## 2019-06-07 NOTE — Chronic Care Management (AMB) (Signed)
Chronic Care Management    Social Work Follow Up Note  06/06/19 Name: Tammy [name] MRN: [id number] DOB: [birth year]  Tammy [name] is a 86 y.o. year old female who is a primary care patient of [name], [place]. The CCM team was consulted for assistance with care coordination.   Review of patient status, including review of consultants reports, other relevant assessments, and collaboration with appropriate care team members and the patient's provider was performed as part of comprehensive patient evaluation and provision of chronic care management services.    SDOH (Social Determinants of Health) assessments performed: No    Outpatient Encounter Medications as of 06/06/2019  Medication Sig  . acetaminophen (TYLENOL) 500 MG tablet Take 500 mg by mouth every 6 (six) hours as needed for mild pain.  [name] amlodipine-olmesartan (AZOR) 10-20 MG tablet Take 1 tablet by mouth daily.  . bimatoprost (LUMIGAN) 0.03 % ophthalmic solution Place 1 drop into both eyes at bedtime.  . calcium-vitamin D (OSCAL WITH D) 500-200 MG-UNIT per tablet Take 1 tablet by mouth daily.  . diclofenac Sodium (VOLTAREN) 1 % GEL Apply 2 g topically 4 (four) times daily.  [name] donepezil (ARICEPT) 10 MG tablet Take 1 tablet (10 mg total) by mouth at bedtime.  . hydrOXYzine (ATARAX/VISTARIL) 25 MG tablet Take 1 tablet (25 mg total) by mouth daily as needed. For agitation  . Magnesium 250 MG TABS Take 1 tablet by mouth with evening meal  . omeprazole (PRILOSEC) 20 MG capsule Take 1 capsule (20 mg total) by mouth daily.   No facility-administered encounter medications on file as of 06/06/2019.     Goals Addressed            This Visit's Progress   . COMPLETED: Collaborate with RN Case Manager to assist with care coordination needs       CARE PLAN ENTRY (see longtitudinal plan of care for additional care plan information)  Current Barriers:  . Progression of Alzheimer's Disease resulting in periods of  agitation . Occasional difficulty performs ADL's and IADL's independently . Limited caregiver support  Social Work Clinical Goal(s):  [name] Over the next 120 days, the patient and her niece will work with CM team to assist with ongoing care coordination needs  CCM SW Interventions: Completed 06/06/19 with [name] . Successful outbound call to the patients caregiver to assist with care coordination needs . Determined the patient continues to do well with current medication regimen o No agitation noted at this time o The patient has planned a trip with her sister to visit family in [place] . Informed by [name] the patient was denied Medicaid due to income and assets . Discussed the patient remains on the wait list to receive an in home aide through a county block grant . Advised [name] to contact SW with any future resource needs . Collaboration with RN Care Manager to notify of SW plan to sign off at this time as patient is stable with all care coordination needs . Goal Met   Patient Self Care Activities:  . Attends all scheduled provider appointments . Calls provider office for new concerns or questions . Supportive family to assist with patient care needs  Please see past updates related to this goal by clicking on the "Past Updates" button in the selected goal          Follow Up Plan: No SW follow up planned at this time. The patient will remain active with RN Care Manager  to assist with care management needs. SW is available to engage patient with any future care coordination needs.  Daneen Schick, BSW, CDP Social Worker, Certified Dementia Practitioner [place] [facility] [place] [facility] [phone]  Total time spent performing care coordination and/or care management activities with the patient by phone or face to face = 8 minutes.

## 2019-06-07 NOTE — Patient Instructions (Signed)
Social Worker Visit Information  Goals we discussed today:  Goals Addressed            This Visit's Progress   . COMPLETED: Collaborate with RN Case Manager to assist with care coordination needs       CARE PLAN ENTRY (see longtitudinal plan of care for additional care plan information)  Current Barriers:  . Progression of Alzheimer's Disease resulting in periods of agitation . Occasional difficulty performs ADL's and IADL's independently . Limited caregiver support  Social Work Clinical Goal(s):  [name] Over the next 120 days, the patient and her niece will work with CM team to assist with ongoing care coordination needs  CCM SW Interventions: Completed 06/06/19 with [name] . Successful outbound call to the patients caregiver to assist with care coordination needs . Determined the patient continues to do well with current medication regimen o No agitation noted at this time o The patient has planned a trip with her sister to visit family in [place] . Informed by [name] the patient was denied Medicaid due to income and assets . Discussed the patient remains on the wait list to receive an in home aide through a county block grant . Advised [name] to contact SW with any future resource needs . Collaboration with RN Care Manager to notify of SW plan to sign off at this time as patient is stable with all care coordination needs . Goal Met   Patient Self Care Activities:  . Attends all scheduled provider appointments . Calls provider office for new concerns or questions . Supportive family to assist with patient care needs  Please see past updates related to this goal by clicking on the "Past Updates" button in the selected goal          Follow Up Plan: No SW follow up planned at this time. Please contact me with future resource needs.   Daneen Schick, BSW, CDP Social Worker, Certified Dementia Practitioner [place] [facility] [place]  [facility] [phone]

## 2019-07-11 ENCOUNTER — Ambulatory Visit: Payer: Self-pay

## 2019-07-11 ENCOUNTER — Other Ambulatory Visit: Payer: Self-pay

## 2019-07-11 ENCOUNTER — Telehealth: Payer: Medicare Other

## 2019-07-11 DIAGNOSIS — I10 Essential (primary) hypertension: Secondary | ICD-10-CM

## 2019-07-11 DIAGNOSIS — E782 Mixed hyperlipidemia: Secondary | ICD-10-CM

## 2019-07-11 DIAGNOSIS — G301 Alzheimer's disease with late onset: Secondary | ICD-10-CM

## 2019-07-12 NOTE — Chronic Care Management (AMB) (Signed)
  Chronic Care Management   Follow Up Note   07/12/2019 Name: Tammy [name] MRN: [id number] DOB: 1933/10/23  Referred by: Arnette Felts, FNP Reason for referral : Chronic Care Management (FU RN CCM Call - BP/OA/Dementia)   Tammy [name] is a 86 y.o. year old female who is a primary care patient of Arnette Felts, FNP. The CCM team was consulted for assistance with chronic disease management and care coordination needs.    Review of patient status, including review of consultants reports, relevant laboratory and other test results, and collaboration with appropriate care team members and the patient's provider was performed as part of comprehensive patient evaluation and provision of chronic care management services.    SDOH (Social Determinants of Health) assessments performed: No See Care Plan activities for detailed interventions related to [facility])   Reviewed chart in preparation to contact patient.     Outpatient Encounter Medications as of 07/11/2019  Medication Sig  . acetaminophen (TYLENOL) 500 MG tablet Take 500 mg by mouth every 6 (six) hours as needed for mild pain.  [name] amlodipine-olmesartan (AZOR) 10-20 MG tablet Take 1 tablet by mouth daily.  . bimatoprost (LUMIGAN) 0.03 % ophthalmic solution Place 1 drop into both eyes at bedtime.  . calcium-vitamin D (OSCAL WITH D) 500-200 MG-UNIT per tablet Take 1 tablet by mouth daily.  . diclofenac Sodium (VOLTAREN) 1 % GEL Apply 2 g topically 4 (four) times daily.  [name] donepezil (ARICEPT) 10 MG tablet Take 1 tablet (10 mg total) by mouth at bedtime.  . hydrOXYzine (ATARAX/VISTARIL) 25 MG tablet Take 1 tablet (25 mg total) by mouth daily as needed. For agitation  . Magnesium 250 MG TABS Take 1 tablet by mouth with evening meal  . omeprazole (PRILOSEC) 20 MG capsule Take 1 capsule (20 mg total) by mouth daily.   No facility-administered encounter medications on file as of 07/11/2019.     Objective:  Lab Results  Component Value Date    HGBA1C 5.6 08/24/2017   Lab Results  Component Value Date   MICROALBUR 80 08/31/2018   LDLCALC 118 (H) 01/03/2019   CREATININE 0.88 01/03/2019   BP Readings from Last 3 Encounters:  05/07/19 (!) 142/84  01/03/19 (!) 142/80  08/30/18 130/82   Plan:   Telephone follow up appointment with care management team member scheduled for: 07/13/19  Delsa Sale, RN, BSN, CCM Care Management Coordinator [facility] Care Management/[facility]  Direct Phone: [phone]

## 2019-07-13 ENCOUNTER — Telehealth: Payer: Self-pay

## 2019-07-16 ENCOUNTER — Ambulatory Visit (INDEPENDENT_AMBULATORY_CARE_PROVIDER_SITE_OTHER): Payer: Medicare Other

## 2019-07-16 ENCOUNTER — Other Ambulatory Visit: Payer: Self-pay

## 2019-07-16 ENCOUNTER — Telehealth: Payer: Medicare Other

## 2019-07-16 DIAGNOSIS — F0280 Dementia in other diseases classified elsewhere without behavioral disturbance: Secondary | ICD-10-CM | POA: Diagnosis not present

## 2019-07-16 DIAGNOSIS — I10 Essential (primary) hypertension: Secondary | ICD-10-CM | POA: Diagnosis not present

## 2019-07-16 DIAGNOSIS — G301 Alzheimer's disease with late onset: Secondary | ICD-10-CM

## 2019-07-16 DIAGNOSIS — E782 Mixed hyperlipidemia: Secondary | ICD-10-CM

## 2019-07-18 NOTE — Patient Instructions (Signed)
Visit Information  Goals Addressed              This Visit's Progress     Patient Stated   .  "we need to start thinking about long term care planning" (pt-stated)        CARE PLAN ENTRY (see longitudinal plan of care for additional care plan information)  Current Barriers:  [name] Knowledge Deficits related to long term care planning  . Chronic Disease Management support and education needs related to HTN, memory loss, hyperlipidemia  Nurse Case Manager Clinical Goal(s):  [name] Over the next 90 days, patient will work with embedded BSW  to address needs related to long term care planning   CCM RN CM Interventions:  07/16/19 call completed with niece Joelene Millin  . Inter-disciplinary care team collaboration (see longitudinal plan of care) . Determined patient's niece would like to speak with the embedded BSW to become better familiar with options for long term care planning . Discussed Ms. Shambaugh received a Medicaid card with Family Planning benefits, however niece Joelene Millin is not familiar with what benefits are covered with this type of Medicaid and would like to learn more about this coverage . Collaborated with embedded BSW Daneen Schick regarding niece's request for outreach to discuss Medicaid benefits and long term care planning  . Discussed plans with patient for ongoing care management follow up and provided patient with direct contact information for care management team  Patient Self Care Activities:  . Attends all scheduled provider appointments . Calls provider office for new concerns or questions . Supportive family to assist with patient care needs  Initial goal documentation       Other   .  "to have less pain her knees"        Niece stated  [place] (see longitudinal plan of care for additional care plan information)  Current Barriers:  [name] Knowledge Deficits related to evaluation and treatment of Osteoarthritis  . Chronic Disease Management support and  education needs related to OA, HTN, memory loss, hyperlipidemia  Nurse Case Manager Clinical Goal(s):  [name] Over the next 90 days, patient will work with CCM RN CM and Orthopedic Specialist  to address needs related to disease education and support for improved Self Health management of   CCM RN CM Interventions:  07/16/19 call completed with niece Joelene Millin . Inter-disciplinary care team collaboration (see longitudinal plan of care) . Evaluation of current treatment plan related to Osteoarthritis and patient's adherence to plan as established by provider . Determined patient continues to have pain and stiffness to her knees bilaterally, however she continues to be ambulatory and able to perform her normal daily routines, niece denies patient having any falls or near falls . Determined patient is getting good effectiveness from this treatment as evidence by decreased c/o pain and improved ROM to her right knee . Determined no Ortho appointments are scheduled for future visits, however, patient and niece Joelene Millin were advised to call for an appointment if symptoms worsen and there was conversation about possible knee replacements are needed at some point in the future . Discussed plans with patient for ongoing care management follow up and provided patient with direct contact information for care management team  Patient Self Care Activities:  . Attends all scheduled provider appointments . Calls provider office for new concerns or questions . Supportive family to assist with patient care needs  Please see past updates related to this goal by clicking on the "Past Updates" button  in the selected goal      .  "to keep her BP under good control"        Niece stated CARE PLAN ENTRY (see longitudinal plan of care for additional care plan information)  Current Barriers:  [name] Knowledge Deficits related to disease process and Self Health management of HTN . Chronic Disease Management support and  education needs related to HTN, memory loss, hyperlipidemia  Nurse Case Manager Clinical Goal(s):  [name] Over the next 90 days, patient will work with CCM RN CM and PCP to address needs related to disease education and support for improved Self Health management of HTN  CCM RN CM Interventions:  06/21//21 call completed with niece [name] . Inter-disciplinary care team collaboration (see longitudinal plan of care) . Evaluation of current treatment plan related to HTN and patient's adherence to plan as established by provider. . Provided education to patient re: importance of keeping BP well controlled; Educated on target BP <130/80; Reviewed most recent BP readings during OV f/u appts . Reviewed medications with patient and discussed indication, dosage and frequency of prescribed medications for HTN; patient is administering with supervision of niece and sister, reports adherence w/o concerns at this time or noted SE . Discussed plans with patient for ongoing care management follow up and provided patient with direct contact information for care management team . Advised patient, providing education and rationale, to monitor blood pressure daily and record, calling CCM RN CM and PCP for findings outside established parameters.  . Confirmed patient received and reviewed printed educational materials related to Hypertension, voices no questions at this time   Patient Self Care Activities:  . Attends all scheduled provider appointments . Calls provider office for new concerns or questions . Supportive family to assist with patient care needs  Please see past updates related to this goal by clicking on the "Past Updates" button in the selected goal         Patient verbalizes understanding of instructions provided today.   Telephone follow up appointment with care management team member scheduled for: 09/10/19  Delsa Sale, RN, BSN, CCM Care Management Coordinator [facility] Care Management/[facility]  Direct Phone: [phone]

## 2019-07-18 NOTE — Chronic Care Management (AMB) (Signed)
Chronic Care Management   Follow Up Note   07/17/2019 Name: Tammy [name] MRN: [id number] DOB: 03/03/1933  Referred by: Arnette Felts, FNP Reason for referral : Chronic Care Management (FU RN CM Call )   Tammy [name] is a 86 y.o. year old female who is a primary care patient of Arnette Felts, FNP. The CCM team was consulted for assistance with chronic disease management and care coordination needs.    Review of patient status, including review of consultants reports, relevant laboratory and other test results, and collaboration with appropriate care team members and the patient's provider was performed as part of comprehensive patient evaluation and provision of chronic care management services.    SDOH (Social Determinants of Health) assessments performed: Yes - review Medicaid benefits/long term care planning See Care Plan activities for detailed interventions related to SDOH)   Placed outbound call to patient's niece [name] for a CCM RN CM care plan update.     Outpatient Encounter Medications as of 07/16/2019  Medication Sig  . acetaminophen (TYLENOL) 500 MG tablet Take 500 mg by mouth every 6 (six) hours as needed for mild pain.  [name] amlodipine-olmesartan (AZOR) 10-20 MG tablet Take 1 tablet by mouth daily.  . bimatoprost (LUMIGAN) 0.03 % ophthalmic solution Place 1 drop into both eyes at bedtime.  . calcium-vitamin D (OSCAL WITH D) 500-200 MG-UNIT per tablet Take 1 tablet by mouth daily.  . diclofenac Sodium (VOLTAREN) 1 % GEL Apply 2 g topically 4 (four) times daily.  [name] donepezil (ARICEPT) 10 MG tablet Take 1 tablet (10 mg total) by mouth at bedtime.  . hydrOXYzine (ATARAX/VISTARIL) 25 MG tablet Take 1 tablet (25 mg total) by mouth daily as needed. For agitation  . Magnesium 250 MG TABS Take 1 tablet by mouth with evening meal  . omeprazole (PRILOSEC) 20 MG capsule Take 1 capsule (20 mg total) by mouth daily.   No facility-administered encounter medications on file as of  07/16/2019.     Objective:  Lab Results  Component Value Date   HGBA1C 5.6 08/24/2017   Lab Results  Component Value Date   MICROALBUR 80 08/31/2018   LDLCALC 118 (H) 01/03/2019   CREATININE 0.88 01/03/2019   BP Readings from Last 3 Encounters:  05/07/19 (!) 142/84  01/03/19 (!) 142/80  08/30/18 130/82    Goals Addressed      Patient Stated   .  "we need to start thinking about long term care planning" (pt-stated)        CARE PLAN ENTRY (see longitudinal plan of care for additional care plan information)  Current Barriers:  [name] Knowledge Deficits related to long term care planning  . Chronic Disease Management support and education needs related to HTN, memory loss, hyperlipidemia  Nurse Case Manager Clinical Goal(s):  [name] Over the next 90 days, patient will work with embedded BSW  to address needs related to long term care planning   CCM RN CM Interventions:  07/16/19 call completed with niece [name]  . Inter-disciplinary care team collaboration (see longitudinal plan of care) . Determined patient's niece would like to speak with the embedded BSW to become better familiar with options for long term care planning . Discussed Ms. Laspina received a Medicaid card with Family Planning benefits, however niece [name] is not familiar with what benefits are covered with this type of Medicaid and would like to learn more about this coverage . Collaborated with embedded BSW [name] regarding niece's request for outreach to discuss  Medicaid benefits and long term care planning  . Discussed plans with patient for ongoing care management follow up and provided patient with direct contact information for care management team  Patient Self Care Activities:  . Attends all scheduled provider appointments . Calls provider office for new concerns or questions . Supportive family to assist with patient care needs  Initial goal documentation       Other   .  "to have less pain  her knees"        Niece stated  CARE PLAN ENTRY (see longitudinal plan of care for additional care plan information)  Current Barriers:  [name] Knowledge Deficits related to evaluation and treatment of Osteoarthritis  . Chronic Disease Management support and education needs related to OA, HTN, memory loss, hyperlipidemia  Nurse Case Manager Clinical Goal(s):  [name] Over the next 90 days, patient will work with CCM RN CM and Orthopedic Specialist  to address needs related to disease education and support for improved Self Health management of   CCM RN CM Interventions:  07/16/19 call completed with niece [name] . Inter-disciplinary care team collaboration (see longitudinal plan of care) . Evaluation of current treatment plan related to Osteoarthritis and patient's adherence to plan as established by provider . Determined patient continues to have pain and stiffness to her knees bilaterally, however she continues to be ambulatory and able to perform her normal daily routines, niece denies patient having any falls or near falls . Determined patient is getting good effectiveness from this treatment as evidence by decreased c/o pain and improved ROM to her right knee . Determined no Ortho appointments are scheduled for future visits, however, patient and niece [name] were advised to call for an appointment if symptoms worsen and there was conversation about possible knee replacements are needed at some point in the future . Discussed plans with patient for ongoing care management follow up and provided patient with direct contact information for care management team  Patient Self Care Activities:  . Attends all scheduled provider appointments . Calls provider office for new concerns or questions . Supportive family to assist with patient care needs  Please see past updates related to this goal by clicking on the "Past Updates" button in the selected goal      .  "to keep her BP under good control"         Niece stated CARE PLAN ENTRY (see longitudinal plan of care for additional care plan information)  Current Barriers:  [name] Knowledge Deficits related to disease process and Self Health management of HTN . Chronic Disease Management support and education needs related to HTN, memory loss, hyperlipidemia  Nurse Case Manager Clinical Goal(s):  [name] Over the next 90 days, patient will work with CCM RN CM and PCP to address needs related to disease education and support for improved Self Health management of HTN  CCM RN CM Interventions:  06/21//21 call completed with niece [name] . Inter-disciplinary care team collaboration (see longitudinal plan of care) . Evaluation of current treatment plan related to HTN and patient's adherence to plan as established by provider. . Provided education to patient re: importance of keeping BP well controlled; Educated on target BP <130/80; Reviewed most recent BP readings during OV f/u appts . Reviewed medications with patient and discussed indication, dosage and frequency of prescribed medications for HTN; patient is administering with supervision of niece and sister, reports adherence w/o concerns at this time or noted SE . Discussed plans with patient for  ongoing care management follow up and provided patient with direct contact information for care management team . Advised patient, providing education and rationale, to monitor blood pressure daily and record, calling CCM RN CM and PCP for findings outside established parameters.  . Confirmed patient received and reviewed printed educational materials related to Hypertension, voices no questions at this time   Patient Self Care Activities:  . Attends all scheduled provider appointments . Calls provider office for new concerns or questions . Supportive family to assist with patient care needs  Please see past updates related to this goal by clicking on the "Past Updates" button in the selected goal          Plan:   Telephone follow up appointment with care management team member scheduled for: 09/10/19  Barb Merino, RN, BSN, CCM Care Management Coordinator [place] [facility]  Direct Phone: [phone]

## 2019-07-26 ENCOUNTER — Ambulatory Visit: Payer: Self-pay

## 2019-07-26 ENCOUNTER — Telehealth: Payer: Self-pay

## 2019-07-26 DIAGNOSIS — F0280 Dementia in other diseases classified elsewhere without behavioral disturbance: Secondary | ICD-10-CM

## 2019-07-26 DIAGNOSIS — I10 Essential (primary) hypertension: Secondary | ICD-10-CM

## 2019-07-26 NOTE — Chronic Care Management (AMB) (Signed)
  Chronic Care Management   Outreach Note  07/26/2019 Name: Tammy [name] MRN: [id number] DOB: [birth year]  Referred by: Arnette Felts, FNP Reason for referral : Care Coordination   SW placed an unsuccessful outbound call to the patients niece and caregiver to assist with ongoing care coordination needs. SW left a HIPAA compliant voice message requesting a return call.  Follow Up Plan: The care management team will reach out to the patient again over the next 14 days.   [name], BSW, CDP Social Worker, Certified Dementia Practitioner [facility] Care Management [phone]

## 2019-08-08 ENCOUNTER — Ambulatory Visit (INDEPENDENT_AMBULATORY_CARE_PROVIDER_SITE_OTHER): Payer: Medicare Other

## 2019-08-08 DIAGNOSIS — F0280 Dementia in other diseases classified elsewhere without behavioral disturbance: Secondary | ICD-10-CM | POA: Diagnosis not present

## 2019-08-08 DIAGNOSIS — G301 Alzheimer's disease with late onset: Secondary | ICD-10-CM

## 2019-08-08 NOTE — Chronic Care Management (AMB) (Signed)
Chronic Care Management    Social Work Follow Up Note  08/08/2019 Name: Tammy [name] MRN: [id number] DOB: [birth year]  Tammy [name] is a 86 y.o. year old female who is a primary care patient of Arnette Felts, FNP. The CCM team was consulted for assistance with care coordination.   Review of patient status, including review of consultants reports, other relevant assessments, and collaboration with appropriate care team members and the patient's provider was performed as part of comprehensive patient evaluation and provision of chronic care management services.    SDOH (Social Determinants of Health) assessments performed: No    Outpatient Encounter Medications as of 08/08/2019  Medication Sig  . acetaminophen (TYLENOL) 500 MG tablet Take 500 mg by mouth every 6 (six) hours as needed for mild pain.  [name] amlodipine-olmesartan (AZOR) 10-20 MG tablet Take 1 tablet by mouth daily.  . bimatoprost (LUMIGAN) 0.03 % ophthalmic solution Place 1 drop into both eyes at bedtime.  . calcium-vitamin D (OSCAL WITH D) 500-200 MG-UNIT per tablet Take 1 tablet by mouth daily.  . diclofenac Sodium (VOLTAREN) 1 % GEL Apply 2 g topically 4 (four) times daily.  [name] donepezil (ARICEPT) 10 MG tablet Take 1 tablet (10 mg total) by mouth at bedtime.  . hydrOXYzine (ATARAX/VISTARIL) 25 MG tablet Take 1 tablet (25 mg total) by mouth daily as needed. For agitation  . Magnesium 250 MG TABS Take 1 tablet by mouth with evening meal  . omeprazole (PRILOSEC) 20 MG capsule Take 1 capsule (20 mg total) by mouth daily.   No facility-administered encounter medications on file as of 08/08/2019.     Goals Addressed              This Visit's Progress     Patient Stated   .  "we need to start thinking about long term care planning" (pt-stated)   On track     CARE PLAN ENTRY (see longitudinal plan of care for additional care plan information)  Current Barriers:  [name] Knowledge Deficits related to long term care planning    . Chronic Disease Management support and education needs related to HTN, memory loss, hyperlipidemia  Nurse Case Manager Clinical Goal(s):  [name] Over the next 90 days, patient will work with embedded BSW  to address needs related to long term care planning   Social Work Goal (s): [name] New 08/08/19- Over the next 120 days the patient and her niece will work with SW to become more knowledgeable of long term care placement options in [place]  CCM SW Interventions: Completed 08/08/19 with [name] . Inter-disciplinary care team collaboration (see longitudinal plan of care) . Successful outbound call placed to [name] to assist with care coordination needs . Determined the patient continues to do well living with her sister in [place], [place] . Discussed concern surrounding occasional agitation toward patient sister and need for long term care planning . Patient is currently eligible for [facility] Family Planning . Provided education surrounding process to transition from Family Planning Medicaid to Long Term Care Medicaid when time to transition into a facility . Determined [name] would like patient placed in [facility] to allow her to be closer to church family and physician offices once placed . Provided [name] with a list of long term care communities within [facility] to choose from via mail . Assessed for urgency of placement o [name] reports she feels the patient is managing well in the home but needs cues and  reminders often to perform ADL's o [name] reports concern over patient having access to car keys and worry she may drive in the middle of the night while the patients sister is asleep o Determined the patient has, occasionally, taken the car during the day once becoming agitated with her sister to drive to [name] home . Discussed the importance of keeping car keys in a secured location the patient does not have access to due to  dementia o Reviewed the patient has been told by both PCP and neurologist she should not drive any longer o Provided dementia specific education related to possibility of the patient driving and becoming lost or injuring herself or another driver o Advised [name] to speak with her aunt whom the patient is living with to develop a plan of how to secure care keys . Collaboration with Medical illustrator and patient primary provider regarding intervention and plan   Patient Self Care Activities:  . Attends all scheduled provider appointments . Calls provider office for new concerns or questions . Supportive family to assist with patient care needs  Please see past updates related to this goal by clicking on the "Past Updates" button in the selected goal        Other   .  "to have less pain her knees"   On track     Niece stated  CARE PLAN ENTRY (see longitudinal plan of care for additional care plan information)  Current Barriers:  [name] Knowledge Deficits related to evaluation and treatment of Osteoarthritis  . Chronic Disease Management support and education needs related to OA, HTN, memory loss, hyperlipidemia  Nurse Case Manager Clinical Goal(s):  [name] Over the next 90 days, patient will work with CCM RN CM and Orthopedic Specialist  to address needs related to disease education and support for improved Self Health management of   CCM SW Interventions Completed 08/08/19 with [name] . Successful outbound call placed to [name] to follow up on care coordination needs . Determined the patient has upcoming Ortho appointment scheduled for 7/22  [name] reports the patient will try gel injections to see if effective with management of knee pain . Collaboration with RN Care Manager to inform of planned Ortho follow up  Patient Self Care Activities:  . Attends all scheduled provider appointments . Calls provider office for new concerns or questions . Supportive family to  assist with patient care needs  Please see past updates related to this goal by clicking on the "Past Updates" button in the selected goal          Follow Up Plan: SW will follow up with patient by phone over the next month.   [name], BSW, CDP Social Worker, Certified Dementia Practitioner [facility] Care Management [phone]  Total time spent performing care coordination and/or care management activities with the patient by phone or face to face = 25 minutes.

## 2019-08-08 NOTE — Patient Instructions (Signed)
Social Worker Visit Information  Goals we discussed today:  Goals Addressed              This Visit's Progress     Patient Stated   .  "we need to start thinking about long term care planning" (pt-stated)   On track     CARE PLAN ENTRY (see longitudinal plan of care for additional care plan information)  Current Barriers:  [name] Knowledge Deficits related to long term care planning  . Chronic Disease Management support and education needs related to HTN, memory loss, hyperlipidemia  Nurse Case Manager Clinical Goal(s):  [name] Over the next 90 days, patient will work with embedded BSW  to address needs related to long term care planning   Social Work Goal (s): [name] New 08/08/19- Over the next 120 days the patient and her niece will work with SW to become more knowledgeable of long term care placement options in [place]  CCM SW Interventions: Completed 08/08/19 with [name] . Inter-disciplinary care team collaboration (see longitudinal plan of care) . Successful outbound call placed to [name] to assist with care coordination needs . Determined the patient continues to do well living with her sister in [place], [place] . Discussed concern surrounding occasional agitation toward patient sister and need for long term care planning . Patient is currently eligible for [facility] Family Planning . Provided education surrounding process to transition from Family Planning Medicaid to Long Term Care Medicaid when time to transition into a facility . Determined [name] would like patient placed in [place] to allow her to be closer to church family and physician offices once placed . Provided [name] with a list of long term care communities within [facility] to choose from via mail . Assessed for urgency of placement o [name] reports she feels the patient is managing well in the home but needs cues and reminders often to perform ADL's o [name] reports  concern over patient having access to car keys and worry she may drive in the middle of the night while the patients sister is asleep o Determined the patient has, occasionally, taken the car during the day once becoming agitated with her sister to drive to [name] home . Discussed the importance of keeping car keys in a secured location the patient does not have access to due to dementia o Reviewed the patient has been told by both PCP and neurologist she should not drive any longer o Provided dementia specific education related to possibility of the patient driving and becoming lost or injuring herself or another driver o Advised [name] to speak with her aunt whom the patient is living with to develop a plan of how to secure care keys . Collaboration with Medical illustrator and patient primary provider regarding intervention and plan   Patient Self Care Activities:  . Attends all scheduled provider appointments . Calls provider office for new concerns or questions . Supportive family to assist with patient care needs  Please see past updates related to this goal by clicking on the "Past Updates" button in the selected goal        Other   .  "to have less pain her knees"   On track     Niece stated  CARE PLAN ENTRY (see longitudinal plan of care for additional care plan information)  Current Barriers:  [name] Knowledge Deficits related to evaluation and treatment of Osteoarthritis  . Chronic Disease Management support  and education needs related to OA, HTN, memory loss, hyperlipidemia  Nurse Case Manager Clinical Goal(s):  [name] Over the next 90 days, patient will work with CCM RN CM and Orthopedic Specialist  to address needs related to disease education and support for improved Self Health management of   CCM SW Interventions Completed 08/08/19 with [name] . Successful outbound call placed to [name] to follow up on care coordination needs . Determined the patient has  upcoming Ortho appointment scheduled for 7/22  [name] reports the patient will try gel injections to see if effective with management of knee pain . Collaboration with RN Care Manager to inform of planned Ortho follow up  Patient Self Care Activities:  . Attends all scheduled provider appointments . Calls provider office for new concerns or questions . Supportive family to assist with patient care needs  Please see past updates related to this goal by clicking on the "Past Updates" button in the selected goal          Materials Provided: Yes: mailed information on long term care communities in [facility]  Follow Up Plan: SW will follow up with patient by phone over the next month.   [name], BSW, CDP Social Worker, Certified Dementia Practitioner [facility] Care Management [phone]

## 2019-08-16 ENCOUNTER — Ambulatory Visit: Payer: Medicare Other | Admitting: Orthopaedic Surgery

## 2019-09-04 ENCOUNTER — Other Ambulatory Visit: Payer: Self-pay

## 2019-09-04 ENCOUNTER — Encounter: Payer: Self-pay | Admitting: Orthopaedic Surgery

## 2019-09-04 ENCOUNTER — Telehealth: Payer: Self-pay

## 2019-09-04 ENCOUNTER — Ambulatory Visit (INDEPENDENT_AMBULATORY_CARE_PROVIDER_SITE_OTHER): Payer: Medicare Other | Admitting: Orthopaedic Surgery

## 2019-09-04 VITALS — Ht 67.0 in | Wt 141.0 lb

## 2019-09-04 DIAGNOSIS — M1711 Unilateral primary osteoarthritis, right knee: Secondary | ICD-10-CM | POA: Diagnosis not present

## 2019-09-04 MED ORDER — BUPIVACAINE HCL 0.5 % IJ SOLN
2.0000 mL | INTRAMUSCULAR | Status: AC | PRN
Start: 2019-09-04 — End: 2019-09-04
  Administered 2019-09-04: 2 mL via INTRA_ARTICULAR

## 2019-09-04 MED ORDER — METHYLPREDNISOLONE ACETATE 40 MG/ML IJ SUSP
80.0000 mg | INTRAMUSCULAR | Status: AC | PRN
Start: 2019-09-04 — End: 2019-09-04
  Administered 2019-09-04: 80 mg via INTRA_ARTICULAR

## 2019-09-04 MED ORDER — LIDOCAINE HCL 1 % IJ SOLN
2.0000 mL | INTRAMUSCULAR | Status: AC | PRN
  Administered 2019-09-04: 2 mL

## 2019-09-04 NOTE — Progress Notes (Signed)
Office Visit Note   Patient: Tammy [name]           Date of Birth: [birth year]           MRN: [id number] Visit Date: 09/04/2019              Requested by: Arnette Felts, FNP [address] [place],  [place] [address] PCP: Arnette Felts, FNP   Assessment & Plan: Visit Diagnoses:  1. Unilateral primary osteoarthritis, right knee     Plan: End-stage osteoarthritis right knee.  Long discussion regarding the diagnosis again and treatment options.  Tammy [name] is accompanied by a family member.  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  Will reinject her knee with cortisone and pre-CERT Visco supplementation. This patient is diagnosed with osteoarthritis of the knee(s).    Radiographs show evidence of joint space narrowing, osteophytes, subchondral sclerosis and/or subchondral cysts.  This patient has knee pain which interferes with functional and activities of daily living.    This patient has experienced inadequate response, adverse effects and/or intolerance with conservative treatments such as acetaminophen, NSAIDS, topical creams, physical therapy or regular exercise, knee bracing and/or weight loss.   This patient has experienced inadequate response or has a contraindication to intra articular steroid injections for at least 3 months.   This patient is not scheduled to have a total knee replacement within 6 months of starting treatment with viscosupplementation.   Follow-Up Instructions: Return Pre-CERT Visco supplementation.   Orders:  Orders Placed This Encounter  Procedures  . Large Joint Inj: R knee   No orders of the defined types were placed in this encounter.     Procedures: Large Joint Inj: R knee on 09/04/2019 1:36 PM Indications: pain and diagnostic evaluation Details: 25 G 1.5 in needle, anteromedial approach  Arthrogram: No  Medications: 2 mL lidocaine 1 %; 2 mL bupivacaine 0.5 %; 80 mg methylPREDNISolone acetate 40 MG/ML Procedure, treatment  alternatives, risks and benefits explained, specific risks discussed. Consent was given by the patient. Immediately prior to procedure a time out was called to verify the correct patient, procedure, equipment, support staff and site/side marked as required. Patient was prepped and draped in the usual sterile fashion.       Clinical Data: No additional findings.   Subjective: Chief Complaint  Patient presents with  . Right Knee - Follow-up  Patient presents today for chronic right knee pain. She was here last on 05/29/2019 and received a cortisone injection. Patient states that the injection was great and worked well. She began to start having pain again one month ago. She takes Tylenol for pain. She would like to get another cortisone injection today.  Walks with the use of cane and a pullover right knee support.  HPI  Review of Systems   Objective: Vital Signs: Ht 5\' 7"  (1.702 m)   Wt 141 lb (64 kg)   BMI 22.08 kg/m   Physical Exam Constitutional:      Appearance: She is well-developed.  Eyes:     Pupils: Pupils are equal, round, and reactive to light.  Pulmonary:     Effort: Pulmonary effort is normal.  Skin:    General: Skin is warm and dry.  Neurological:     Mental Status: She is alert and oriented to person, place, and time.  Psychiatric:        Behavior: Behavior normal.     Ortho Exam slight varus right knee with small effusion.  Having both medial and  lateral joint discomfort.  Some patellar crepitation.  No instability.  No calf pain.  Straight leg raise negative painless range of motion both hips Specialty Comments:  No specialty comments available.  Imaging: No results found.   PMFS History: Patient Active Problem List   Diagnosis Date Noted  . Unilateral primary osteoarthritis, right knee 05/29/2019  . Gait disturbance 07/04/2018  . Right knee pain 07/04/2018  . Alzheimer disease (HCC) 07/04/2018  . Iron deficiency 11/23/2017  . Mixed  hyperlipidemia 11/23/2017  . Fatigue 11/23/2017  . Memory loss 11/23/2017  . History of falling 11/23/2017  . Chest pain 10/17/2016  . Leukocytosis 10/17/2016  . Diverticulosis of large intestine with hemorrhage   . Lower GI bleed   . Rectal bleeding 09/26/2015  . Essential hypertension 09/26/2015  . Acute blood loss anemia 09/26/2015  . Hypokalemia 09/26/2015  . HIP REPLACEMENT, RIGHT, HX OF 05/27/2008  . VITAMIN D DEFICIENCY 05/13/2008  . GERD 05/13/2008  . OSTEOARTHRITIS, HIPS, BILATERAL 05/13/2008   Past Medical History:  Diagnosis Date  . Acid reflux   . Glaucoma   . High cholesterol   . Hypertension     Family History  Problem Relation Age of Onset  . Hypertension Mother   . Hypertension Sister   . Hypertension Brother   . Heart disease Brother     Past Surgical History:  Procedure Laterality Date  . ABDOMINAL HYSTERECTOMY    . COLONOSCOPY N/A 09/30/2015   Procedure: COLONOSCOPY;  Surgeon: Jeani Hawking, MD;  Location: [facility] ENDOSCOPY;  Service: Endoscopy;  Laterality: N/A;  . JOINT REPLACEMENT    . REVISION TOTAL HIP ARTHROPLASTY Bilateral    2011 and 2012   Social History   Occupational History  . Occupation: Retired  Tobacco Use  . Smoking status: Never Smoker  . Smokeless tobacco: Never Used  Vaping Use  . Vaping Use: Never used  Substance and Sexual Activity  . Alcohol use: No  . Drug use: No  . Sexual activity: Not Currently

## 2019-09-04 NOTE — Telephone Encounter (Signed)
New start    Submitted for VOB for Orthovisc-Right knee

## 2019-09-04 NOTE — Telephone Encounter (Signed)
Please precert for right knee gel injection. This is Dr.Whitfield's patient. Thanks!

## 2019-09-05 ENCOUNTER — Ambulatory Visit: Payer: Medicare Other | Admitting: Nurse Practitioner

## 2019-09-05 ENCOUNTER — Ambulatory Visit: Payer: Medicare Other

## 2019-09-06 ENCOUNTER — Telehealth: Payer: Self-pay

## 2019-09-06 NOTE — Telephone Encounter (Signed)
Approved for Orthovisc-Right knee Dr. Estill Bakes and Bill Covered @ 100%-2nd insurance to pick up cost after medicare No prior auth required

## 2019-09-06 NOTE — Telephone Encounter (Signed)
Ok to schedule - next available.

## 2019-09-06 NOTE — Telephone Encounter (Signed)
Called and schedule/d appointments

## 2019-09-10 ENCOUNTER — Other Ambulatory Visit: Payer: Self-pay

## 2019-09-10 ENCOUNTER — Ambulatory Visit (INDEPENDENT_AMBULATORY_CARE_PROVIDER_SITE_OTHER): Payer: Medicare Other

## 2019-09-10 ENCOUNTER — Telehealth: Payer: Medicare Other

## 2019-09-10 DIAGNOSIS — G8929 Other chronic pain: Secondary | ICD-10-CM

## 2019-09-10 DIAGNOSIS — G301 Alzheimer's disease with late onset: Secondary | ICD-10-CM

## 2019-09-10 DIAGNOSIS — E782 Mixed hyperlipidemia: Secondary | ICD-10-CM

## 2019-09-10 DIAGNOSIS — M25561 Pain in right knee: Secondary | ICD-10-CM

## 2019-09-10 DIAGNOSIS — I10 Essential (primary) hypertension: Secondary | ICD-10-CM

## 2019-09-10 DIAGNOSIS — F0280 Dementia in other diseases classified elsewhere without behavioral disturbance: Secondary | ICD-10-CM

## 2019-09-11 ENCOUNTER — Telehealth: Payer: Medicare Other

## 2019-09-11 ENCOUNTER — Telehealth: Payer: Self-pay

## 2019-09-11 NOTE — Telephone Encounter (Signed)
  Chronic Care Management   Outreach Note  09/11/2019 Name: Tammy [name] MRN: [id number] DOB: August 16, 1933  Referred by: Arnette Felts, FNP Reason for referral : Care Coordination   An unsuccessful telephone outreach was attempted today. The patient was referred to the case management team for assistance with care management and care coordination.   Follow Up Plan: A HIPPA compliant phone message was left for the patient providing contact information and requesting a return call.  The care management team will reach out to the patient again over the next 14 days.   [name], BSW, CDP Social Worker, Certified Dementia Practitioner [facility] Care Management [phone]

## 2019-09-13 NOTE — Patient Instructions (Signed)
Visit Information  Goals Addressed            This Visit's Progress   . "to have less pain her knees"   On track    Niece stated  CARE PLAN ENTRY (see longitudinal plan of care for additional care plan information)  Current Barriers:  [name] Knowledge Deficits related to evaluation and treatment of Osteoarthritis  . Chronic Disease Management support and education needs related to OA, HTN, memory loss, hyperlipidemia  Nurse Case Manager Clinical Goal(s):  . 09/10/19 New Over the next 90 days, patient will work with CCM RN CM and Orthopedic Specialist  to address needs related to disease education and support for improved Self Health management of   CCM RN CM Interventions:  09/10/19 call completed with niece [name] . Inter-disciplinary care team collaboration (see longitudinal plan of care) . Evaluation of current treatment plan related to Osteoarthritis and patient's adherence to plan as established by provider . Determined patient continues to have pain and stiffness to her knees bilaterally, however she continues to be ambulatory and able to perform her normal daily routines, niece denies patient having any falls or near falls . Determined patient is getting good effectiveness from the Orthovisc gel injections performed by Orthopedics,  she has decreased pain and improved ROM to her right knee following the injections . Discussed importance of having patient wearing good supportive shoes and avoiding walking on uneven ground to help avoid falls . Discussed plans with patient for ongoing care management follow up and provided patient with direct contact information for care management team  Patient Self Care Activities:  . Attends all scheduled provider appointments . Calls provider office for new concerns or questions . Supportive family to assist with patient care needs  Please see past updates related to this goal by clicking on the "Past Updates" button in the selected goal       . "to keep her BP under good control"   On track    Niece stated CARE PLAN ENTRY (see longitudinal plan of care for additional care plan information)  Current Barriers:  [name] Knowledge Deficits related to disease process and Self Health management of HTN . Chronic Disease Management support and education needs related to HTN, memory loss, hyperlipidemia  Nurse Case Manager Clinical Goal(s):  . 09/10/19 New Over the next 90 days, patient will work with CCM RN CM and PCP to address needs related to disease education and support for improved Self Health management of HTN  CCM RN CM Interventions:  09/07/19 call completed with niece [name] . Inter-disciplinary care team collaboration (see longitudinal plan of care) . Evaluation of current treatment plan related to HTN and patient's adherence to plan as established by provider. . Reinforced education to niece [name] re: importance of keeping BP well controlled; Educated on target BP <130/80; stressed importance of following a low sodium diet . Reviewed medications with patient and discussed indication, dosage and frequency of prescribed medications for HTN; patient is administering with supervision of niece and sister, reports adherence w/o concerns at this time or noted SE . Advised patient, providing education and rationale, to monitor blood pressure daily and record, calling CCM RN CM and PCP for findings outside established parameters   . Mailed printed educational materials related to Elderly Nutrition . Discussed plans with patient for ongoing care management follow up and provided patient with direct contact information for care management team  Patient Self Care Activities:  . Attends all scheduled provider appointments .  Calls provider office for new concerns or questions . Supportive family to assist with patient care needs  Please see past updates related to this goal by clicking on the "Past Updates" button in the selected goal          Patient verbalizes understanding of instructions provided today.   Telephone follow up appointment with care management team member scheduled for: 11/07/19  Delsa Sale, RN, BSN, CCM Care Management Coordinator [facility] Care Management/[facility]  Direct Phone: [phone]

## 2019-09-13 NOTE — Chronic Care Management (AMB) (Signed)
Chronic Care Management   Follow Up Note   09/13/2019 Name: Tammy [name] MRN: [id number] DOB: 10-17-1933  Referred by: Arnette Felts, FNP Reason for referral : Chronic Care Management (FU RN CM Call)   Tammy [name] is a 86 y.o. year old female who is a primary care patient of Arnette Felts, FNP. The CCM team was consulted for assistance with chronic disease management and care coordination needs.    Review of patient status, including review of consultants reports, relevant laboratory and other test results, and collaboration with appropriate care team members and the patient's provider was performed as part of comprehensive patient evaluation and provision of chronic care management services.    SDOH (Social Determinants of Health) assessments performed: Yes - no new challenges identified at this time See Care Plan activities for detailed interventions related to SDOH)   Placed CCM RN CM outbound call to patient's niece [name] for a care plan update.     Outpatient Encounter Medications as of 09/10/2019  Medication Sig  . acetaminophen (TYLENOL) 500 MG tablet Take 500 mg by mouth every 6 (six) hours as needed for mild pain.  [name] amlodipine-olmesartan (AZOR) 10-20 MG tablet Take 1 tablet by mouth daily.  . bimatoprost (LUMIGAN) 0.03 % ophthalmic solution Place 1 drop into both eyes at bedtime.  . calcium-vitamin D (OSCAL WITH D) 500-200 MG-UNIT per tablet Take 1 tablet by mouth daily.  . diclofenac Sodium (VOLTAREN) 1 % GEL Apply 2 g topically 4 (four) times daily.  [name] donepezil (ARICEPT) 10 MG tablet Take 1 tablet (10 mg total) by mouth at bedtime.  . hydrOXYzine (ATARAX/VISTARIL) 25 MG tablet Take 1 tablet (25 mg total) by mouth daily as needed. For agitation  . Magnesium 250 MG TABS Take 1 tablet by mouth with evening meal  . omeprazole (PRILOSEC) 20 MG capsule Take 1 capsule (20 mg total) by mouth daily.   No facility-administered encounter medications on file as of  09/10/2019.     Objective:  Lab Results  Component Value Date   HGBA1C 5.6 08/24/2017   Lab Results  Component Value Date   MICROALBUR 80 08/31/2018   LDLCALC 118 (H) 01/03/2019   CREATININE 0.88 01/03/2019   BP Readings from Last 3 Encounters:  05/07/19 (!) 142/84  01/03/19 (!) 142/80  08/30/18 130/82    Goals Addressed    . "to have less pain her knees"   On track    Niece stated  CARE PLAN ENTRY (see longitudinal plan of care for additional care plan information)  Current Barriers:  [name] Knowledge Deficits related to evaluation and treatment of Osteoarthritis  . Chronic Disease Management support and education needs related to OA, HTN, memory loss, hyperlipidemia  Nurse Case Manager Clinical Goal(s):  . 09/10/19 New Over the next 90 days, patient will work with CCM RN CM and Orthopedic Specialist  to address needs related to disease education and support for improved Self Health management of   CCM RN CM Interventions:  09/10/19 call completed with niece [name] . Inter-disciplinary care team collaboration (see longitudinal plan of care) . Evaluation of current treatment plan related to Osteoarthritis and patient's adherence to plan as established by provider . Determined patient continues to have pain and stiffness to her knees bilaterally, however she continues to be ambulatory and able to perform her normal daily routines, niece denies patient having any falls or near falls . Determined patient is getting good effectiveness from the Orhtovisc gel injections performed by Orthopedics,  she has decreased pain and improved ROM to her right knee following the injections . Discussed importance of having patient wearing good supportive shoes and avoiding walking on uneven ground to help avoid falls . Discussed plans with patient for ongoing care management follow up and provided patient with direct contact information for care management team  Patient Self Care Activities:   . Attends all scheduled provider appointments . Calls provider office for new concerns or questions . Supportive family to assist with patient care needs  Please see past updates related to this goal by clicking on the "Past Updates" button in the selected goal      . "to keep her BP under good control"   On track    Niece stated CARE PLAN ENTRY (see longitudinal plan of care for additional care plan information)  Current Barriers:  [name] Knowledge Deficits related to disease process and Self Health management of HTN . Chronic Disease Management support and education needs related to HTN, memory loss, hyperlipidemia  Nurse Case Manager Clinical Goal(s):  . 09/10/19 New Over the next 90 days, patient will work with CCM RN CM and PCP to address needs related to disease education and support for improved Self Health management of HTN  CCM RN CM Interventions:  09/07/19 call completed with niece [name] . Inter-disciplinary care team collaboration (see longitudinal plan of care) . Evaluation of current treatment plan related to HTN and patient's adherence to plan as established by provider. . Reinforced education to niece [name] re: importance of keeping BP well controlled; Educated on target BP <130/80; stressed importance of following a low sodium diet . Reviewed medications with patient and discussed indication, dosage and frequency of prescribed medications for HTN; patient is administering with supervision of niece and sister, reports adherence w/o concerns at this time or noted SE . Advised patient, providing education and rationale, to monitor blood pressure daily and record, calling CCM RN CM and PCP for findings outside established parameters   . Mailed printed educational materials related to Elderly Nutrition . Discussed plans with patient for ongoing care management follow up and provided patient with direct contact information for care management team  Patient Self Care Activities:   . Attends all scheduled provider appointments . Calls provider office for new concerns or questions . Supportive family to assist with patient care needs  Please see past updates related to this goal by clicking on the "Past Updates" button in the selected goal         Plan:   Telephone follow up appointment with care management team member scheduled for: 11/07/19  Delsa Sale, RN, BSN, CCM Care Management Coordinator [facility] Care Management/[facility]  Direct Phone: [phone]

## 2019-09-18 ENCOUNTER — Ambulatory Visit: Payer: Medicare Other

## 2019-09-18 DIAGNOSIS — I10 Essential (primary) hypertension: Secondary | ICD-10-CM | POA: Diagnosis not present

## 2019-09-18 DIAGNOSIS — G301 Alzheimer's disease with late onset: Secondary | ICD-10-CM

## 2019-09-18 DIAGNOSIS — F0280 Dementia in other diseases classified elsewhere without behavioral disturbance: Secondary | ICD-10-CM | POA: Diagnosis not present

## 2019-09-18 DIAGNOSIS — E782 Mixed hyperlipidemia: Secondary | ICD-10-CM | POA: Diagnosis not present

## 2019-09-18 NOTE — Patient Instructions (Signed)
Social Worker Visit Information  Goals we discussed today:  Goals Addressed              This Visit's Progress     Patient Stated   .  COMPLETED: "we need to start thinking about long term care planning" (pt-stated)        CARE PLAN ENTRY (see longitudinal plan of care for additional care plan information)  Current Barriers:  [name] Knowledge Deficits related to long term care planning  . Chronic Disease Management support and education needs related to HTN, memory loss, hyperlipidemia  Nurse Case Manager Clinical Goal(s):  [name] Over the next 90 days, patient will work with embedded BSW  to address needs related to long term care planning   Social Work Goal (s): [name] New 08/08/19- Over the next 120 days the patient and her niece will work with SW to become more knowledgeable of long term care placement options in [place]  CCM SW Interventions: Completed 09/18/19 with [name] . Successful outbound call placed to the patients niece to follow up on goal progression . Confirmed receipt of mailed resource . Discussed concerns surrounding LTC placement at this time due to COVID 19 numbers as well as barriers to locating a Medicaid funded memory care bed . Determined the patient is doing well at home and [name] would like to keep her at home as long as possible at the direction of her orthopedic physician due to safety . Advised [name] to keep SW contact number on hand and to contact SW directly if patient needs placement in the future . Goal closed  Completed 08/08/19 with [name] . Inter-disciplinary care team collaboration (see longitudinal plan of care) . Successful outbound call placed to [name] to assist with care coordination needs . Determined the patient continues to do well living with her sister in [place], [place] . Discussed concern surrounding occasional agitation toward patient sister and need for long term care planning . Patient is currently  eligible for [facility] Family Planning . Provided education surrounding process to transition from Family Planning Medicaid to Long Term Care Medicaid when time to transition into a facility . Determined [name] would like patient placed in [place] to allow her to be closer to church family and physician offices once placed . Provided [name] with a list of long term care communities within [facility] to choose from via mail . Assessed for urgency of placement o [name] reports she feels the patient is managing well in the home but needs cues and reminders often to perform ADL's o [name] reports concern over patient having access to car keys and worry she may drive in the middle of the night while the patients sister is asleep o Determined the patient has, occasionally, taken the car during the day once becoming agitated with her sister to drive to [name] home . Discussed the importance of keeping car keys in a secured location the patient does not have access to due to dementia o Reviewed the patient has been told by both PCP and neurologist she should not drive any longer o Provided dementia specific education related to possibility of the patient driving and becoming lost or injuring herself or another driver o Advised [name] to speak with her aunt whom the patient is living with to develop a plan of how to secure care keys . Collaboration with Medical illustrator and patient primary provider regarding intervention and plan  CCM RN CM Interventions:  07/16/19 call completed with niece [name]  . Inter-disciplinary care team collaboration (see longitudinal plan of care) . Determined patient's niece would like to speak with the embedded BSW to become better familiar with options for long term care planning . Discussed Ms. Friddle received a Medicaid card with Family Planning benefits, however niece [name] is not familiar with what benefits are  covered with this type of Medicaid and would like to learn more about this coverage . Collaborated with embedded BSW [name] regarding niece's request for outreach to discuss Medicaid benefits and long term care planning  . Discussed plans with patient for ongoing care management follow up and provided patient with direct contact information for care management team  Patient Self Care Activities:  . Attends all scheduled provider appointments . Calls provider office for new concerns or questions . Supportive family to assist with patient care needs  Please see past updates related to this goal by clicking on the "Past Updates" button in the selected goal          Follow Up Plan: No SW follow up planned at this time. The patient will remain active with RN Care Manager.   [name], BSW, CDP Social Worker, Certified Dementia Practitioner [facility] Care Management [phone]

## 2019-09-18 NOTE — Chronic Care Management (AMB) (Signed)
Chronic Care Management    Social Work Follow Up Note  09/18/2019 Name: Tammy [name] MRN: [id number] DOB: [birth year]  Tammy [name] is a 86 y.o. year old female who is a primary care patient of Tammy Felts, FNP. The CCM team was consulted for assistance with care coordination.   Review of patient status, including review of consultants reports, other relevant assessments, and collaboration with appropriate care team members and the patient's provider was performed as part of comprehensive patient evaluation and provision of chronic care management services.    SDOH (Social Determinants of Health) assessments performed: No    Outpatient Encounter Medications as of 09/18/2019  Medication Sig  . acetaminophen (TYLENOL) 500 MG tablet Take 500 mg by mouth every 6 (six) hours as needed for mild pain.  [name] amlodipine-olmesartan (AZOR) 10-20 MG tablet Take 1 tablet by mouth daily.  . bimatoprost (LUMIGAN) 0.03 % ophthalmic solution Place 1 drop into both eyes at bedtime.  . calcium-vitamin D (OSCAL WITH D) 500-200 MG-UNIT per tablet Take 1 tablet by mouth daily.  . diclofenac Sodium (VOLTAREN) 1 % GEL Apply 2 g topically 4 (four) times daily.  [name] donepezil (ARICEPT) 10 MG tablet Take 1 tablet (10 mg total) by mouth at bedtime.  . hydrOXYzine (ATARAX/VISTARIL) 25 MG tablet Take 1 tablet (25 mg total) by mouth daily as needed. For agitation  . Magnesium 250 MG TABS Take 1 tablet by mouth with evening meal  . omeprazole (PRILOSEC) 20 MG capsule Take 1 capsule (20 mg total) by mouth daily.   No facility-administered encounter medications on file as of 09/18/2019.     Goals Addressed              This Visit's Progress     Patient Stated   .  COMPLETED: "we need to start thinking about long term care planning" (pt-stated)        CARE PLAN ENTRY (see longitudinal plan of care for additional care plan information)  Current Barriers:  [name] Knowledge Deficits related to long term care  planning  . Chronic Disease Management support and education needs related to HTN, memory loss, hyperlipidemia  Nurse Case Manager Clinical Goal(s):  [name] Over the next 90 days, patient will work with embedded BSW  to address needs related to long term care planning   Social Work Goal (s): [name] New 08/08/19- Over the next 120 days the patient and her niece will work with SW to become more knowledgeable of long term care placement options in [name]  CCM SW Interventions: Completed 09/18/19 with [name] . Successful outbound call placed to the patients niece to follow up on goal progression . Confirmed receipt of mailed resource . Discussed concerns surrounding LTC placement at this time due to COVID 19 numbers as well as barriers to locating a Medicaid funded memory care bed . Determined the patient is doing well at home and [name] would like to keep her at home as long as possible at the direction of her orthopedic physician due to safety . Advised [name] to keep SW contact number on hand and to contact SW directly if patient needs placement in the future . Goal closed  Completed 08/08/19 with [place]my [name] . Inter-disciplinary care team collaboration (see longitudinal plan of care) . Successful outbound call placed to [name] to assist with care coordination needs . Determined the patient continues to do well living with her sister [name] . Discussed concern surrounding occasional agitation  toward patient sister and need for long term care planning . Patient is currently eligible for [name] Family Planning . Provided education surrounding process to transition from Family Planning Medicaid to Long Term Care Medicaid when time to transition into a facility . Determined [name] would like patient placed in [name] to allow her to be closer to church family and physician offices once placed . Provided [name] with a list of long term care  communities within [name] to choose from via mail . Assessed for urgency of placement o [name] reports she feels the patient is managing well in the home but needs cues and reminders often to perform ADL's o [name] reports concern over patient having access to car keys and worry she may drive in the middle of the night while the patients sister is asleep o Determined the patient has, occasionally, taken the car during the day once becoming agitated with her sister to drive to [name] home . Discussed the importance of keeping car keys in a secured location the patient does not have access to due to dementia o Reviewed the patient has been told by both PCP and neurologist she should not drive any longer o Provided dementia specific education related to possibility of the patient driving and becoming lost or injuring herself or another driver o Advised [name] to speak with her aunt whom the patient is living with to develop a plan of how to secure care keys . Collaboration with Medical illustrator and patient primary provider regarding intervention and plan   CCM RN CM Interventions:  07/16/19 call completed with niece [name]  . Inter-disciplinary care team collaboration (see longitudinal plan of care) . Determined patient's niece would like to speak with the embedded BSW to become better familiar with options for long term care planning . Discussed Ms. Colasanti received a Medicaid card with Family Planning benefits, however niece [place]my [name] is not familiar with what benefits are covered with this type of Medicaid and would like to learn more about this coverage . Collaborated with embedded BSW [name] regarding niece's request for outreach to discuss Medicaid benefits and long term care planning  . Discussed plans with patient for ongoing care management follow up and provided patient with direct contact information for care management team  Patient Self Care  Activities:  . Attends all scheduled provider appointments . Calls provider office for new concerns or questions . Supportive family to assist with patient care needs  Please see past updates related to this goal by clicking on the "Past Updates" button in the selected goal          Follow Up Plan: No SW follow up planned at this time. The patient will remain active with RN Care Manager.   [name], BSW, CDP Social Worker, Certified Dementia Practitioner [facility] Care Management [phone]  Total time spent performing care coordination and/or care management activities with the patient by phone or face to face = 25 minutes.

## 2019-10-23 ENCOUNTER — Ambulatory Visit (INDEPENDENT_AMBULATORY_CARE_PROVIDER_SITE_OTHER): Payer: Medicare Other | Admitting: Orthopaedic Surgery

## 2019-10-23 ENCOUNTER — Other Ambulatory Visit: Payer: Self-pay

## 2019-10-23 ENCOUNTER — Encounter: Payer: Self-pay | Admitting: Orthopaedic Surgery

## 2019-10-23 VITALS — Ht 67.0 in | Wt 141.0 lb

## 2019-10-23 DIAGNOSIS — M1711 Unilateral primary osteoarthritis, right knee: Secondary | ICD-10-CM

## 2019-10-23 MED ORDER — LIDOCAINE HCL 1 % IJ SOLN
2.0000 mL | INTRAMUSCULAR | Status: AC | PRN
  Administered 2019-10-23: 2 mL

## 2019-10-23 MED ORDER — HYALURONAN 30 MG/2ML IX SOSY
30.0000 mg | PREFILLED_SYRINGE | INTRA_ARTICULAR | Status: AC | PRN
  Administered 2019-10-23: 30 mg via INTRA_ARTICULAR

## 2019-10-23 NOTE — Progress Notes (Signed)
Office Visit Note   Patient: Tammy [name]           Date of Birth: [birth year]           MRN: [id number] Visit Date: 10/23/2019              Requested by: Arnette Felts, FNP [address] [place],  [place] [address] PCP: Arnette Felts, FNP   Assessment & Plan: Visit Diagnoses:  1. Unilateral primary osteoarthritis, right knee     Plan:  #1: The first Orthovisc injection was given to the right knee without difficulty.  Tolerated the procedure well. #2: Follow back up in 1 week for her second Orthovisc injection to the knee.  Follow-Up Instructions: Return in about 1 week (around 10/30/2019).   Orders:  No orders of the defined types were placed in this encounter.  No orders of the defined types were placed in this encounter.     Procedures: Large Joint Inj: R knee on 10/23/2019 1:52 PM Indications: pain and joint swelling Details: 25 G 1.5 in needle, anteromedial approach  Arthrogram: No  Medications: 2 mL lidocaine 1 %; 30 mg Hyaluronan 30 MG/2ML Outcome: tolerated well, no immediate complications Procedure, treatment alternatives, risks and benefits explained, specific risks discussed. Consent was given by the patient. Immediately prior to procedure a time out was called to verify the correct patient, procedure, equipment, support staff and site/side marked as required. Patient was prepped and draped in the usual sterile fashion.       Clinical Data: No additional findings.   Subjective: Chief Complaint  Patient presents with  . Right Knee - Follow-up    Orthovisc started 10/23/2019   HPI Patient presents today for right knee orthovisc injection. This is her first injection of the series.   Review of Systems  Constitutional: Negative for fatigue.  HENT: Negative for ear pain.   Eyes: Negative for pain.  Respiratory: Negative for shortness of breath.   Cardiovascular: Negative for leg swelling.  Gastrointestinal: Negative for constipation  and diarrhea.  Endocrine: Negative for cold intolerance and heat intolerance.  Genitourinary: Negative for difficulty urinating.  Musculoskeletal: Positive for joint swelling.  Skin: Negative for rash.  Allergic/Immunologic: Negative for food allergies.  Neurological: Positive for weakness.  Hematological: Does not bruise/bleed easily.  Psychiatric/Behavioral: Negative for sleep disturbance.     Objective: Vital Signs: Ht 5\' 7"  (1.702 m)   Wt 141 lb (64 kg)   BMI 22.08 kg/m   Physical Exam Constitutional:      Appearance: Normal appearance. She is normal weight.  HENT:     Head: Normocephalic.  Cardiovascular:     Pulses: Normal pulses.  Skin:    General: Skin is warm and dry.  Neurological:     General: No focal deficit present.     Mental Status: She is alert and oriented to person, place, and time.      Ortho Exam  Exam today reveals a minimal effusion.  She does have tenderness to palpation about the joint lines medially and laterally.  Patellofemoral crepitance is also noted.  Calf is supple nontender.  Specialty Comments:  No specialty comments available.  Imaging: No results found.   PMFS History: Current Outpatient Medications  Medication Sig Dispense Refill  . acetaminophen (TYLENOL) 500 MG tablet Take 500 mg by mouth every 6 (six) hours as needed for mild pain.    amlodipine-olmesartan (AZOR) 10-20 MG tablet Take 1 tablet by mouth daily. 90 tablet 1  .  bimatoprost (LUMIGAN) 0.03 % ophthalmic solution Place 1 drop into both eyes at bedtime.    . calcium-vitamin D (OSCAL WITH D) 500-200 MG-UNIT per tablet Take 1 tablet by mouth daily.    . diclofenac Sodium (VOLTAREN) 1 % GEL Apply 2 g topically 4 (four) times daily. 100 g 3  . donepezil (ARICEPT) 10 MG tablet Take 1 tablet (10 mg total) by mouth at bedtime. 90 tablet 4  . hydrOXYzine (ATARAX/VISTARIL) 25 MG tablet Take 1 tablet (25 mg total) by mouth daily as needed. For agitation 30 tablet 2  .  Magnesium 250 MG TABS Take 1 tablet by mouth with evening meal 90 tablet 1  . omeprazole (PRILOSEC) 20 MG capsule Take 1 capsule (20 mg total) by mouth daily. 90 capsule 1   No current facility-administered medications for this visit.    Patient Active Problem List   Diagnosis Date Noted  . Unilateral primary osteoarthritis, right knee 05/29/2019  . Gait disturbance 07/04/2018  . Right knee pain 07/04/2018  . Alzheimer disease (HCC) 07/04/2018  . Iron deficiency 11/23/2017  . Mixed hyperlipidemia 11/23/2017  . Fatigue 11/23/2017  . Memory loss 11/23/2017  . History of falling 11/23/2017  . Chest pain 10/17/2016  . Leukocytosis 10/17/2016  . Diverticulosis of large intestine with hemorrhage   . Lower GI bleed   . Rectal bleeding 09/26/2015  . Essential hypertension 09/26/2015  . Acute blood loss anemia 09/26/2015  . Hypokalemia 09/26/2015  . HIP REPLACEMENT, RIGHT, HX OF 05/27/2008  . VITAMIN D DEFICIENCY 05/13/2008  . GERD 05/13/2008  . OSTEOARTHRITIS, HIPS, BILATERAL 05/13/2008   Past Medical History:  Diagnosis Date  . Acid reflux   . Glaucoma   . High cholesterol   . Hypertension     Family History  Problem Relation Age of Onset  . Hypertension Mother   . Hypertension Sister   . Hypertension Brother   . Heart disease Brother     Past Surgical History:  Procedure Laterality Date  . ABDOMINAL HYSTERECTOMY    . COLONOSCOPY N/A 09/30/2015   Procedure: COLONOSCOPY;  Surgeon: Jeani Hawking, MD;  Location: [facility];  Service: Endoscopy;  Laterality: N/A;  . JOINT REPLACEMENT    . REVISION TOTAL HIP ARTHROPLASTY Bilateral    2011 and 2012   Social History   Occupational History  . Occupation: Retired  Tobacco Use  . Smoking status: Never Smoker  . Smokeless tobacco: Never Used  Vaping Use  . Vaping Use: Never used  Substance and Sexual Activity  . Alcohol use: No  . Drug use: No  . Sexual activity: Not Currently

## 2019-10-30 ENCOUNTER — Ambulatory Visit (INDEPENDENT_AMBULATORY_CARE_PROVIDER_SITE_OTHER): Payer: Medicare Other | Admitting: Orthopaedic Surgery

## 2019-10-30 ENCOUNTER — Other Ambulatory Visit: Payer: Self-pay

## 2019-10-30 ENCOUNTER — Encounter: Payer: Self-pay | Admitting: Orthopaedic Surgery

## 2019-10-30 VITALS — Ht 67.0 in | Wt 141.0 lb

## 2019-10-30 DIAGNOSIS — M1711 Unilateral primary osteoarthritis, right knee: Secondary | ICD-10-CM | POA: Diagnosis not present

## 2019-10-30 MED ORDER — HYALURONAN 30 MG/2ML IX SOSY
30.0000 mg | PREFILLED_SYRINGE | INTRA_ARTICULAR | Status: AC | PRN
  Administered 2019-10-30: 30 mg via INTRA_ARTICULAR

## 2019-10-30 NOTE — Progress Notes (Signed)
Office Visit Note   Patient: Tammy [name]           Date of Birth: 1933/10/15           MRN: [id number] Visit Date: 10/30/2019              Requested by: Arnette Felts, FNP [address] [place],  [place] [address] PCP: Arnette Felts, FNP   Assessment & Plan: Visit Diagnoses:  1. Unilateral primary osteoarthritis, right knee     Plan: Second Orthovisc injection right knee.  No problems with the first.  Return in 1 week to complete the series of 3  Follow-Up Instructions: Return in about 1 week (around 11/06/2019).   Orders:  Orders Placed This Encounter  Procedures  . Large Joint Inj: R knee   No orders of the defined types were placed in this encounter.     Procedures: Large Joint Inj: R knee on 10/30/2019 1:07 PM Indications: pain and joint swelling Details: 25 G 1.5 in needle  Arthrogram: No  Medications: 30 mg Hyaluronan 30 MG/2ML Outcome: tolerated well, no immediate complications Procedure, treatment alternatives, risks and benefits explained, specific risks discussed. Consent was given by the patient. Immediately prior to procedure a time out was called to verify the correct patient, procedure, equipment, support staff and site/side marked as required. Patient was prepped and draped in the usual sterile fashion.       Clinical Data: No additional findings.   Subjective: Chief Complaint  Patient presents with  . Right Knee - Follow-up    orthovisc started 10/23/2019  Patient presents today for the second orthovisc injection in his right knee. She started the series on 10/23/2019. No complications from the first injection, and she wishes to proceed with the next injection.  HPI  Review of Systems   Objective: Vital Signs: Ht 5\' 7"  (1.702 m)   Wt 141 lb (64 kg)   BMI 22.08 kg/m   Physical Exam  Ortho Exam possibly very small effusion right knee.  The knee was not hot warm or red.  No instability  Specialty Comments:  No specialty  comments available.  Imaging: No results found.   PMFS History: Patient Active Problem List   Diagnosis Date Noted  . Unilateral primary osteoarthritis, right knee 05/29/2019  . Gait disturbance 07/04/2018  . Right knee pain 07/04/2018  . Alzheimer disease (HCC) 07/04/2018  . Iron deficiency 11/23/2017  . Mixed hyperlipidemia 11/23/2017  . Fatigue 11/23/2017  . Memory loss 11/23/2017  . History of falling 11/23/2017  . Chest pain 10/17/2016  . Leukocytosis 10/17/2016  . Diverticulosis of large intestine with hemorrhage   . Lower GI bleed   . Rectal bleeding 09/26/2015  . Essential hypertension 09/26/2015  . Acute blood loss anemia 09/26/2015  . Hypokalemia 09/26/2015  . HIP REPLACEMENT, RIGHT, HX OF 05/27/2008  . VITAMIN D DEFICIENCY 05/13/2008  . GERD 05/13/2008  . OSTEOARTHRITIS, HIPS, BILATERAL 05/13/2008   Past Medical History:  Diagnosis Date  . Acid reflux   . Glaucoma   . High cholesterol   . Hypertension     Family History  Problem Relation Age of Onset  . Hypertension Mother   . Hypertension Sister   . Hypertension Brother   . Heart disease Brother     Past Surgical History:  Procedure Laterality Date  . ABDOMINAL HYSTERECTOMY    . COLONOSCOPY N/A 09/30/2015   Procedure: COLONOSCOPY;  Surgeon: 11/30/2015, MD;  Location: [facility];  Service:  Endoscopy;  Laterality: N/A;  . JOINT REPLACEMENT    . REVISION TOTAL HIP ARTHROPLASTY Bilateral    2011 and 2012   Social History   Occupational History  . Occupation: Retired  Tobacco Use  . Smoking status: Never Smoker  . Smokeless tobacco: Never Used  Vaping Use  . Vaping Use: Never used  Substance and Sexual Activity  . Alcohol use: No  . Drug use: No  . Sexual activity: Not Currently

## 2019-11-06 ENCOUNTER — Encounter: Payer: Self-pay | Admitting: Orthopaedic Surgery

## 2019-11-06 ENCOUNTER — Other Ambulatory Visit: Payer: Self-pay

## 2019-11-06 ENCOUNTER — Ambulatory Visit (INDEPENDENT_AMBULATORY_CARE_PROVIDER_SITE_OTHER): Payer: Medicare Other | Admitting: Orthopaedic Surgery

## 2019-11-06 VITALS — Ht 67.0 in | Wt 141.0 lb

## 2019-11-06 DIAGNOSIS — M1711 Unilateral primary osteoarthritis, right knee: Secondary | ICD-10-CM

## 2019-11-06 MED ORDER — HYALURONAN 30 MG/2ML IX SOSY
30.0000 mg | PREFILLED_SYRINGE | INTRA_ARTICULAR | Status: AC | PRN
  Administered 2019-11-06: 30 mg via INTRA_ARTICULAR

## 2019-11-06 MED ORDER — LIDOCAINE HCL 1 % IJ SOLN
2.0000 mL | INTRAMUSCULAR | Status: AC | PRN
  Administered 2019-11-06: 2 mL

## 2019-11-06 NOTE — Progress Notes (Signed)
Office Visit Note   Patient: Tammy [name]           Date of Birth: 04/27/1933           MRN: [id number] Visit Date: 11/06/2019              Requested by: Arnette Felts, FNP [address] [place],  [place] [address] PCP: Arnette Felts, FNP   Assessment & Plan: Visit Diagnoses:  1. Unilateral primary osteoarthritis, right knee     Plan:  #1: Final injection was given today without difficulty. #2: Follow back up as needed  Follow-Up Instructions: No follow-ups on file.   Orders:  Orders Placed This Encounter  Procedures  . Large Joint Inj: R knee   No orders of the defined types were placed in this encounter.     Procedures: Large Joint Inj: R knee on 11/06/2019 1:20 PM Indications: pain and joint swelling Details: 25 G 1.5 in needle, anteromedial approach  Arthrogram: No  Medications: 2 mL lidocaine 1 %; 30 mg Hyaluronan 30 MG/2ML Outcome: tolerated well, no immediate complications Procedure, treatment alternatives, risks and benefits explained, specific risks discussed. Consent was given by the patient. Immediately prior to procedure a time out was called to verify the correct patient, procedure, equipment, support staff and site/side marked as required. Patient was prepped and draped in the usual sterile fashion.       Clinical Data: No additional findings.   Subjective: Chief Complaint  Patient presents with  . Right Knee - Follow-up    Orthovisc started 10/23/2019   HPI Patient presents today for the third Orthovisc injection in her right knee. She started the series on 10/23/2019. Patient states that she has had not complications with the injections. She has noticed improvement in her knee.  Denies any reactivity.  Review of Systems   Objective: Vital Signs: Ht 5\' 7"  (1.702 m)   Wt 141 lb (64 kg)   BMI 22.08 kg/m   Physical Exam  Ortho Exam  Exam today reveals the knee to be quite benign.  No reactivity.  No warmth with her  erythema.  Specialty Comments:  No specialty comments available.  Imaging: No results found.   PMFS History: Current Outpatient Medications  Medication Sig Dispense Refill  . acetaminophen (TYLENOL) 500 MG tablet Take 500 mg by mouth every 6 (six) hours as needed for mild pain.    amlodipine-olmesartan (AZOR) 10-20 MG tablet Take 1 tablet by mouth daily. 90 tablet 1  . bimatoprost (LUMIGAN) 0.03 % ophthalmic solution Place 1 drop into both eyes at bedtime.    . calcium-vitamin D (OSCAL WITH D) 500-200 MG-UNIT per tablet Take 1 tablet by mouth daily.    . diclofenac Sodium (VOLTAREN) 1 % GEL Apply 2 g topically 4 (four) times daily. 100 g 3  . donepezil (ARICEPT) 10 MG tablet Take 1 tablet (10 mg total) by mouth at bedtime. 90 tablet 4  . hydrOXYzine (ATARAX/VISTARIL) 25 MG tablet Take 1 tablet (25 mg total) by mouth daily as needed. For agitation 30 tablet 2  . Magnesium 250 MG TABS Take 1 tablet by mouth with evening meal 90 tablet 1  . omeprazole (PRILOSEC) 20 MG capsule Take 1 capsule (20 mg total) by mouth daily. 90 capsule 1   No current facility-administered medications for this visit.    Patient Active Problem List   Diagnosis Date Noted  . Unilateral primary osteoarthritis, right knee 05/29/2019  . Gait disturbance 07/04/2018  .  Right knee pain 07/04/2018  . Alzheimer disease (HCC) 07/04/2018  . Iron deficiency 11/23/2017  . Mixed hyperlipidemia 11/23/2017  . Fatigue 11/23/2017  . Memory loss 11/23/2017  . History of falling 11/23/2017  . Chest pain 10/17/2016  . Leukocytosis 10/17/2016  . Diverticulosis of large intestine with hemorrhage   . Lower GI bleed   . Rectal bleeding 09/26/2015  . Essential hypertension 09/26/2015  . Acute blood loss anemia 09/26/2015  . Hypokalemia 09/26/2015  . HIP REPLACEMENT, RIGHT, HX OF 05/27/2008  . VITAMIN D DEFICIENCY 05/13/2008  . GERD 05/13/2008  . OSTEOARTHRITIS, HIPS, BILATERAL 05/13/2008   Past Medical History:    Diagnosis Date  . Acid reflux   . Glaucoma   . High cholesterol   . Hypertension     Family History  Problem Relation Age of Onset  . Hypertension Mother   . Hypertension Sister   . Hypertension Brother   . Heart disease Brother     Past Surgical History:  Procedure Laterality Date  . ABDOMINAL HYSTERECTOMY    . COLONOSCOPY N/A 09/30/2015   Procedure: COLONOSCOPY;  Surgeon: Jeani Hawking, MD;  Location: [facility];  Service: Endoscopy;  Laterality: N/A;  . JOINT REPLACEMENT    . REVISION TOTAL HIP ARTHROPLASTY Bilateral    2011 and 2012   Social History   Occupational History  . Occupation: Retired  Tobacco Use  . Smoking status: Never Smoker  . Smokeless tobacco: Never Used  Vaping Use  . Vaping Use: Never used  Substance and Sexual Activity  . Alcohol use: No  . Drug use: No  . Sexual activity: Not Currently

## 2019-11-07 ENCOUNTER — Telehealth: Payer: Medicare Other

## 2019-11-13 ENCOUNTER — Telehealth: Payer: Self-pay

## 2019-11-13 NOTE — Telephone Encounter (Signed)
I spoke with the patient's niece, [name], about scheduling AWV.  She said that she would have to call back, but she really doesn't see the need for it.  She said her aunt has Alzheimers and gets really frustrated when being asked those questions.

## 2019-11-20 DIAGNOSIS — Z23 Encounter for immunization: Secondary | ICD-10-CM | POA: Diagnosis not present

## 2019-12-06 ENCOUNTER — Telehealth: Payer: Self-pay

## 2019-12-06 ENCOUNTER — Telehealth: Payer: Medicare Other

## 2019-12-06 NOTE — Telephone Encounter (Cosign Needed Addendum)
  Chronic Care Management   Outreach Note  12/06/2019 Name: Tammy [name] MRN: [id number] DOB: 1933-03-14  Referred by: Arnette Felts, FNP Reason for referral : Chronic Care Management (CCM RNCM FU Call )   An unsuccessful telephone outreach was attempted today. The patient was referred to the case management team for assistance with care management and care coordination. I spoke with [name], patient's niece briefly today, unfortunately she is not available to speak with me at this time but will be available tomorrow am.    Follow Up Plan: Telephone follow up appointment with care management team member scheduled for: 12/07/19  Delsa Sale, RN, BSN, CCM Care Management Coordinator [facility] Care Management/[facility]  Direct Phone: [phone]

## 2019-12-07 ENCOUNTER — Telehealth: Payer: Medicare Other

## 2019-12-07 ENCOUNTER — Telehealth: Payer: Self-pay

## 2019-12-07 NOTE — Telephone Encounter (Cosign Needed)
  Chronic Care Management   Outreach Note  12/07/2019 Name: Tammy [name] MRN: [id number] DOB: 1933-06-12  Referred by: Arnette Felts, FNP Reason for referral : Chronic Care Management (#2 CCM RN CM FU Call )   A second unsuccessful telephone outreach was attempted today. The patient was referred to the case management team for assistance with care management and care coordination.   Follow Up Plan: Telephone follow up appointment with care management team member scheduled for: 12/28/19  Delsa Sale, RN, BSN, CCM Care Management Coordinator [facility] Care Management/[facility]  Direct Phone: [phone]

## 2019-12-08 ENCOUNTER — Other Ambulatory Visit: Payer: Self-pay | Admitting: Nurse Practitioner

## 2019-12-08 DIAGNOSIS — I10 Essential (primary) hypertension: Secondary | ICD-10-CM

## 2019-12-28 ENCOUNTER — Ambulatory Visit: Payer: Self-pay

## 2019-12-28 ENCOUNTER — Telehealth: Payer: Medicare Other

## 2019-12-28 DIAGNOSIS — M25561 Pain in right knee: Secondary | ICD-10-CM

## 2019-12-28 DIAGNOSIS — G301 Alzheimer's disease with late onset: Secondary | ICD-10-CM

## 2019-12-28 DIAGNOSIS — E782 Mixed hyperlipidemia: Secondary | ICD-10-CM

## 2019-12-28 DIAGNOSIS — I10 Essential (primary) hypertension: Secondary | ICD-10-CM

## 2019-12-28 NOTE — Chronic Care Management (AMB) (Signed)
  Chronic Care Management   Outreach Note  12/28/2019 Name: Tammy [name] MRN: [id number] DOB: 05-25-1933  Referred by: Arnette Felts, FNP Reason for referral : Chronic Care Management (#3 RNCM FU Call Attempt)   Third unsuccessful telephone outreach was attempted today. The patient was referred to the case management team for assistance with care management and care coordination. The patient's primary care provider has been notified of our unsuccessful attempts to make or maintain contact with the patient. The care management team is pleased to engage with this patient at any time in the future should he/she be interested in assistance from the care management team.   Follow Up Plan: We have been unable to make contact with the patient for follow up. The care management team is available to follow up with the patient after provider conversation with the patient regarding recommendation for care management engagement and subsequent re-referral to the care management team.   Delsa Sale, RN, BSN, CCM Care Management Coordinator [facility] Care Management/[facility]  Direct Phone: [phone]

## 2020-01-05 ENCOUNTER — Other Ambulatory Visit: Payer: Self-pay | Admitting: Nurse Practitioner

## 2020-01-05 DIAGNOSIS — I10 Essential (primary) hypertension: Secondary | ICD-10-CM

## 2020-01-05 DIAGNOSIS — G3184 Mild cognitive impairment, so stated: Secondary | ICD-10-CM

## 2020-02-05 ENCOUNTER — Other Ambulatory Visit: Payer: Self-pay | Admitting: Nurse Practitioner

## 2020-02-05 ENCOUNTER — Telehealth: Payer: Self-pay

## 2020-02-05 DIAGNOSIS — I10 Essential (primary) hypertension: Secondary | ICD-10-CM

## 2020-02-05 DIAGNOSIS — G3184 Mild cognitive impairment, so stated: Secondary | ICD-10-CM

## 2020-02-05 NOTE — Telephone Encounter (Signed)
Patient needs to keep next appt for further refills. Niece notified.

## 2020-02-26 ENCOUNTER — Telehealth: Payer: Self-pay

## 2020-02-26 ENCOUNTER — Ambulatory Visit: Payer: Medicare Other | Admitting: Nurse Practitioner

## 2020-02-26 NOTE — Telephone Encounter (Signed)
Tammy [name] called stating pt will not be able to make appointment today due to furnace needing to be repaired would like to reschedule appointment for sometime next week. She also wanted to know if we could a few more pills to the pharmacy. [phone]  I called the niece and left her a vm to call the office so we can reschedule pt appointment [facility]

## 2020-03-04 ENCOUNTER — Other Ambulatory Visit: Payer: Self-pay | Admitting: Nurse Practitioner

## 2020-03-04 DIAGNOSIS — I10 Essential (primary) hypertension: Secondary | ICD-10-CM

## 2020-03-04 DIAGNOSIS — G3184 Mild cognitive impairment, so stated: Secondary | ICD-10-CM

## 2020-03-20 ENCOUNTER — Encounter: Payer: Self-pay | Admitting: Nurse Practitioner

## 2020-03-20 ENCOUNTER — Other Ambulatory Visit: Payer: Self-pay

## 2020-03-20 ENCOUNTER — Ambulatory Visit (INDEPENDENT_AMBULATORY_CARE_PROVIDER_SITE_OTHER): Payer: Medicare Other | Admitting: Nurse Practitioner

## 2020-03-20 VITALS — BP 134/80 | HR 88 | Temp 98.8°F | Ht 67.0 in | Wt 154.0 lb

## 2020-03-20 DIAGNOSIS — W19XXXA Unspecified fall, initial encounter: Secondary | ICD-10-CM

## 2020-03-20 DIAGNOSIS — I2510 Atherosclerotic heart disease of native coronary artery without angina pectoris: Secondary | ICD-10-CM | POA: Diagnosis not present

## 2020-03-20 DIAGNOSIS — E782 Mixed hyperlipidemia: Secondary | ICD-10-CM

## 2020-03-20 DIAGNOSIS — I10 Essential (primary) hypertension: Secondary | ICD-10-CM

## 2020-03-20 DIAGNOSIS — M25571 Pain in right ankle and joints of right foot: Secondary | ICD-10-CM | POA: Diagnosis not present

## 2020-03-20 NOTE — Progress Notes (Signed)
I,Tammy [name] as a Education administrator for [employer], FNP.,have documented all relevant documentation on the behalf of Tammy Brine, FNP,as directed by  Tammy Brine, FNP while in the presence of Tammy [name], Tammy [name]. This visit occurred during the SARS-CoV-2 public health emergency.  Safety protocols were in place, including screening questions prior to the visit, additional usage of staff PPE, and extensive cleaning of exam room while observing appropriate contact time as indicated for disinfecting solutions.  Subjective:     Patient ID: Tammy [name] , female    DOB: 04-24-1933 , 87 y.o.   MRN: [id number]   Chief Complaint  Patient presents with  . Hypertension  . Hyperlipidemia  . Fall    Patient fell about 3 weeks ago and twisted her ankle and her ankle has been swollen since.     HPI  Patient presents today for a blood pressure and cholesterol check. She also stated she fell 3 weeks ago and twisted her ankle. Her ankle has been swollen and causing her some pain.  Tammy [name] took her to an orthopedic in [place] but since they did not have her insurance card.  She has not been taking her Aricept due to having increased dreams  Hypertension This is a chronic problem. The current episode started more than 1 year ago. The problem is unchanged. The problem is controlled. Pertinent negatives include no chest pain, headaches, palpitations or shortness of breath. Risk factors for coronary artery disease include sedentary lifestyle. The current treatment provides no improvement. There are no compliance problems.  There is no history of angina. There is no history of chronic renal disease.  Hyperlipidemia This is a chronic problem. The current episode started more than 1 year ago. The problem is controlled. She has no history of chronic renal disease. Pertinent negatives include no chest pain or shortness of breath. There are no compliance problems.   Fall Pertinent negatives include no  headaches.     Past Medical History:  Diagnosis Date  . Acid reflux   . Glaucoma   . High cholesterol   . Hypertension      Family History  Problem Relation Age of Onset  . Hypertension Mother   . Hypertension Sister   . Hypertension Brother   . Heart disease Brother      Current Outpatient Medications:  .  acetaminophen (TYLENOL) 500 MG tablet, Take 500 mg by mouth every 6 (six) hours as needed for mild pain., Disp: , Rfl:  .  bimatoprost (LUMIGAN) 0.03 % ophthalmic solution, Place 1 drop into both eyes at bedtime., Disp: , Rfl:  .  calcium-vitamin D (OSCAL WITH D) 500-200 MG-UNIT per tablet, Take 1 tablet by mouth daily., Disp: , Rfl:  .  diclofenac Sodium (VOLTAREN) 1 % GEL, Apply 2 g topically 4 (four) times daily., Disp: 100 g, Rfl: 3 .  hydrOXYzine (ATARAX/VISTARIL) 25 MG tablet, Take 1 tablet (25 mg total) by mouth daily as needed. For agitation, Disp: 30 tablet, Rfl: 2 .  Magnesium 250 MG TABS, Take 1 tablet by mouth with evening meal, Disp: 90 tablet, Rfl: 1 .  amlodipine-olmesartan (AZOR) 10-20 MG tablet, TAKE 1 TABLET BY MOUTH ONCE DAILY., Disp: 30 tablet, Rfl: 2 .  donepezil (ARICEPT) 10 MG tablet, TAKE ONE TABLET BY MOUTH AT BEDTIME., Disp: 30 tablet, Rfl: 2 .  omeprazole (PRILOSEC) 20 MG capsule, TAKE (1) CAPSULE BY MOUTH ONCE DAILY., Disp: 30 capsule, Rfl: 2   Allergies  Allergen Reactions  . Advil [Ibuprofen] Itching  .  Ciprofloxacin Other (See Comments)    REACTION: hives  . Zolpidem Tartrate Other (See Comments)    REACTION: trembling  . Unisom [Doxylamine] Swelling and Rash     Review of Systems  Constitutional: Negative.   HENT: Negative.   Eyes: Negative.   Respiratory: Negative.  Negative for shortness of breath.   Cardiovascular: Negative.  Negative for chest pain and palpitations.  Gastrointestinal: Negative.   Endocrine: Negative.   Genitourinary: Negative.   Musculoskeletal: Positive for joint swelling (right ankle).  Skin: Negative.    Neurological: Negative.  Negative for headaches.  Hematological: Negative.   Psychiatric/Behavioral: Negative.      Today's Vitals   03/20/20 0931  BP: 134/80  Pulse: 88  Temp: 98.8 F (37.1 C)  Weight: 154 lb (69.9 kg)  Height: _0  (1.702 m)  PainSc: 0-No pain   Body mass index is 24.12 kg/m.   Objective:  Physical Exam Constitutional:      General: She is not in acute distress.    Appearance: Normal appearance. She is normal weight.  Cardiovascular:     Rate and Rhythm: Normal rate and regular rhythm.     Pulses: Normal pulses.     Heart sounds: Normal heart sounds.  Pulmonary:     Effort: Pulmonary effort is normal.     Breath sounds: Normal breath sounds.  Abdominal:     General: Abdomen is flat. Bowel sounds are normal.     Palpations: Abdomen is soft.  Musculoskeletal:        General: Normal range of motion.     Cervical back: Normal range of motion and neck supple.  Skin:    General: Skin is warm and dry.     Capillary Refill: Capillary refill takes less than 2 seconds.  Neurological:     General: No focal deficit present.     Mental Status: She is alert and oriented to person, place, and time.  Psychiatric:        Mood and Affect: Mood normal.        Behavior: Behavior normal.        Thought Content: Thought content normal.        Judgment: Judgment normal.         Assessment And Plan:     1. Essential hypertension  Chronic, fair control  Continue with current medications - Lipid panel - CMP14+EGFR  2. Mixed hyperlipidemia  Chronic, controlled  Continue with current medications, tolerating well - CMP14+EGFR  3. Atherosclerotic cardiovascular disease  On statin, tolerating well  4. Acute right ankle pain  Tenderness to right ankle with mild swelling  Will check ankle xray since she has been falling - DG Ankle Complete Right; Future  5. Fall, initial encounter  Has had a fall with possible injury to right ankle, doing better  as long as she elevates her feet     Patient was given opportunity to ask questions. Patient verbalized understanding of the plan and was able to repeat key elements of the plan. All questions were answered to their satisfaction.  Tammy Brine, FNP   I, Tammy Brine, FNP, have reviewed all documentation for this visit. The documentation on 03/18/20 for the exam, diagnosis, procedures, and orders are all accurate and complete.   THE PATIENT IS ENCOURAGED TO PRACTICE SOCIAL DISTANCING DUE TO THE COVID-19 PANDEMIC.

## 2020-03-20 NOTE — Patient Instructions (Signed)

## 2020-03-21 LAB — CMP14+EGFR
ALT: 7 IU/L (ref 0–32)
AST: 15 IU/L (ref 0–40)
Albumin/Globulin Ratio: 1.4 (ref 1.2–2.2)
Albumin: 4.4 g/dL (ref 3.6–4.6)
Alkaline Phosphatase: 184 IU/L — ABNORMAL HIGH (ref 44–121)
BUN/Creatinine Ratio: 13 (ref 12–28)
BUN: 12 mg/dL (ref 8–27)
Bilirubin Total: 0.4 mg/dL (ref 0.0–1.2)
CO2: 24 mmol/L (ref 20–29)
Calcium: 9.5 mg/dL (ref 8.7–10.3)
Chloride: 105 mmol/L (ref 96–106)
Creatinine, Ser: 0.91 mg/dL (ref 0.57–1.00)
GFR calc Af Amer: 67 mL/min/{1.73_m2} (ref >59)
GFR calc non Af Amer: 58 mL/min/{1.73_m2} — ABNORMAL LOW (ref >59)
Globulin, Total: 3.1 g/dL (ref 1.5–4.5)
Glucose: 97 mg/dL (ref 65–99)
Potassium: 4.6 mmol/L (ref 3.5–5.2)
Sodium: 144 mmol/L (ref 134–144)
Total Protein: 7.5 g/dL (ref 6.0–8.5)

## 2020-03-21 LAB — LIPID PANEL
Chol/HDL Ratio: 3.5 ratio (ref 0.0–4.4)
Cholesterol, Total: 219 mg/dL — ABNORMAL HIGH (ref 100–199)
HDL: 63 mg/dL (ref >39)
LDL Chol Calc (NIH): 142 mg/dL — ABNORMAL HIGH (ref 0–99)
Triglycerides: 81 mg/dL (ref 0–149)
VLDL Cholesterol Cal: 14 mg/dL (ref 5–40)

## 2020-03-26 ENCOUNTER — Telehealth: Payer: Medicare Other

## 2020-04-01 ENCOUNTER — Other Ambulatory Visit: Payer: Self-pay | Admitting: Nurse Practitioner

## 2020-04-01 DIAGNOSIS — G3184 Mild cognitive impairment, so stated: Secondary | ICD-10-CM

## 2020-04-01 DIAGNOSIS — I10 Essential (primary) hypertension: Secondary | ICD-10-CM

## 2020-04-02 ENCOUNTER — Ambulatory Visit (INDEPENDENT_AMBULATORY_CARE_PROVIDER_SITE_OTHER): Payer: Medicare Other

## 2020-04-02 ENCOUNTER — Telehealth: Payer: Medicare Other

## 2020-04-02 ENCOUNTER — Ambulatory Visit: Payer: Medicare Other

## 2020-04-02 DIAGNOSIS — I2510 Atherosclerotic heart disease of native coronary artery without angina pectoris: Secondary | ICD-10-CM | POA: Diagnosis not present

## 2020-04-02 DIAGNOSIS — F0280 Dementia in other diseases classified elsewhere without behavioral disturbance: Secondary | ICD-10-CM

## 2020-04-02 DIAGNOSIS — I10 Essential (primary) hypertension: Secondary | ICD-10-CM | POA: Diagnosis not present

## 2020-04-02 DIAGNOSIS — G301 Alzheimer's disease with late onset: Secondary | ICD-10-CM | POA: Diagnosis not present

## 2020-04-02 DIAGNOSIS — E782 Mixed hyperlipidemia: Secondary | ICD-10-CM | POA: Diagnosis not present

## 2020-04-02 NOTE — Chronic Care Management (AMB) (Signed)
Chronic Care Management   CCM RN Visit Note  04/02/2020 Name: Tammy [name] MRN: [id number] DOB: [birth year]  Subjective: Tammy [name] is a 87 y.o. year old female who is a primary care patient of [name], [place]. The care management team was consulted for assistance with disease management and care coordination needs.    Collaboration with embedded [place]  for case collaboration  in response to provider referral for case management and/or care coordination services.   Consent to Services:  The patient was given the following information about Chronic Care Management services today, agreed to services, and gave verbal consent: 1. CCM service includes personalized support from designated clinical staff supervised by the primary care provider, including individualized plan of care and coordination with other care providers 2. 24/7 contact phone numbers for assistance for urgent and routine care needs. 3. Service will only be billed when office clinical staff spend 20 minutes or more in a month to coordinate care. 4. Only one practitioner may furnish and bill the service in a calendar month. 5.The patient may stop CCM services at any time (effective at the end of the month) by phone call to the office staff. 6. The patient will be responsible for cost sharing (co-pay) of up to 20% of the service fee (after annual deductible is met). Patient agreed to services and consent obtained.  Patient agreed to services and verbal consent obtained.   Assessment: Review of patient past medical history, allergies, medications, health status, including review of consultants reports, laboratory and other test data, was performed as part of comprehensive evaluation and provision of chronic care management services.   SDOH (Social Determinants of Health) assessments and interventions performed:    CCM Care Plan  Allergies  Allergen Reactions  . Advil [Ibuprofen] Itching  . Ciprofloxacin Other  (See Comments)    REACTION: hives  . Zolpidem Tartrate Other (See Comments)    REACTION: trembling  . Unisom [Doxylamine] Swelling and Rash    Outpatient Encounter Medications as of 04/02/2020  Medication Sig  . acetaminophen (TYLENOL) 500 MG tablet Take 500 mg by mouth every 6 (six) hours as needed for mild pain.  [name] amlodipine-olmesartan (AZOR) 10-20 MG tablet TAKE 1 TABLET BY MOUTH ONCE DAILY.  . bimatoprost (LUMIGAN) 0.03 % ophthalmic solution Place 1 drop into both eyes at bedtime.  . calcium-vitamin D (OSCAL WITH D) 500-200 MG-UNIT per tablet Take 1 tablet by mouth daily.  . diclofenac Sodium (VOLTAREN) 1 % GEL Apply 2 g topically 4 (four) times daily.  [name] donepezil (ARICEPT) 10 MG tablet TAKE ONE TABLET BY MOUTH AT BEDTIME.  . hydrOXYzine (ATARAX/VISTARIL) 25 MG tablet Take 1 tablet (25 mg total) by mouth daily as needed. For agitation  . Magnesium 250 MG TABS Take 1 tablet by mouth with evening meal  . omeprazole (PRILOSEC) 20 MG capsule TAKE (1) CAPSULE BY MOUTH ONCE DAILY.   No facility-administered encounter medications on file as of 04/02/2020.    Patient Active Problem List   Diagnosis Date Noted  . Unilateral primary osteoarthritis, right knee 05/29/2019  . Gait disturbance 07/04/2018  . Right knee pain 07/04/2018  . Alzheimer disease ([place]) 07/04/2018  . Iron deficiency 11/23/2017  . Mixed hyperlipidemia 11/23/2017  . Fatigue 11/23/2017  . Memory loss 11/23/2017  . History of falling 11/23/2017  . Chest pain 10/17/2016  . Leukocytosis 10/17/2016  . Diverticulosis of large intestine with hemorrhage   . Lower GI bleed   . Rectal bleeding 09/26/2015  .  Essential hypertension 09/26/2015  . Acute blood loss anemia 09/26/2015  . Hypokalemia 09/26/2015  . HIP REPLACEMENT, RIGHT, HX OF 05/27/2008  . VITAMIN D DEFICIENCY 05/13/2008  . GERD 05/13/2008  . OSTEOARTHRITIS, HIPS, BILATERAL 05/13/2008    Conditions to be addressed/monitored:Essential hypertension, Mixed  hyperlipidemia, Atherosclerotic cardiovascular disease, 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  Care Plan : Chronic Care Management and Care Coordination  Updates made by Lynne Logan, RN since 04/02/2020 12:00 AM    Problem: Assist with Chronic Care Management and Care Coordination needs   Priority: High    Goal: Assist with Chronic Care Management and Care Coordination needs   Start Date: 04/02/2020  This Visit's Progress: On track  Priority: High  Note:   Current Barriers:  [name] Knowledge Barriers related to resources and support available to address needs related to Essential hypertension, Mixed hyperlipidemia, Atherosclerotic cardiovascular disease, 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 Case Manager Clinical Goal(s):  [name] Over the next 90 days, patient will work with the embedded CCM team to address needs related to care management and care coordination needs Interventions:  . Collaborated with  embedded BSW Daneen Schick  to establish an individualized plan of care  Patient Self Care Activities:  . Patient will work with the embedded CCM team to address care coordination needs and will continue to work with the clinical team to address health care and disease management related needs.    Initial RN CM follow up call scheduled: 04/30/20    Plan:Telephone follow up appointment with care management team member scheduled for:  04/30/20  Barb Merino, RN, BSN, CCM Care Management Coordinator [place] [facility]  Direct Phone: [phone]

## 2020-04-03 ENCOUNTER — Ambulatory Visit (INDEPENDENT_AMBULATORY_CARE_PROVIDER_SITE_OTHER): Payer: Medicare Other | Admitting: Orthopaedic Surgery

## 2020-04-03 ENCOUNTER — Encounter: Payer: Self-pay | Admitting: Orthopaedic Surgery

## 2020-04-03 ENCOUNTER — Other Ambulatory Visit: Payer: Self-pay

## 2020-04-03 ENCOUNTER — Telehealth: Payer: Self-pay

## 2020-04-03 ENCOUNTER — Ambulatory Visit (INDEPENDENT_AMBULATORY_CARE_PROVIDER_SITE_OTHER): Payer: Medicare Other

## 2020-04-03 VITALS — Ht 67.0 in | Wt 154.0 lb

## 2020-04-03 DIAGNOSIS — S82843A Displaced bimalleolar fracture of unspecified lower leg, initial encounter for closed fracture: Secondary | ICD-10-CM | POA: Insufficient documentation

## 2020-04-03 DIAGNOSIS — M25571 Pain in right ankle and joints of right foot: Secondary | ICD-10-CM

## 2020-04-03 DIAGNOSIS — I2510 Atherosclerotic heart disease of native coronary artery without angina pectoris: Secondary | ICD-10-CM | POA: Diagnosis not present

## 2020-04-03 DIAGNOSIS — M1711 Unilateral primary osteoarthritis, right knee: Secondary | ICD-10-CM

## 2020-04-03 DIAGNOSIS — M25561 Pain in right knee: Secondary | ICD-10-CM | POA: Diagnosis not present

## 2020-04-03 DIAGNOSIS — S82841A Displaced bimalleolar fracture of right lower leg, initial encounter for closed fracture: Secondary | ICD-10-CM | POA: Diagnosis not present

## 2020-04-03 NOTE — Telephone Encounter (Signed)
Noted  

## 2020-04-03 NOTE — Patient Instructions (Signed)
Social Worker Visit Information  Goals we discussed today:  Goals Addressed            This Visit's Progress   . Effective Long-Term Care Planning       Timeframe:  Long-Range Goal Priority:  Medium Start Date:   3.9.22                          Expected End Date: 6.8.22                    Next planned outreach: 5.9.22  Patient Goals/Self-Care Activities . Over the next 60 days, patient will: With the help of her POA [name]  - Patient will self administer medications as prescribed Patient will attend all scheduled provider appointments Patient will call provider office for new concerns or questions Contact SW as needed prior to next scheduled call

## 2020-04-03 NOTE — Progress Notes (Signed)
Office Visit Note   Patient: Tammy [name]           Date of Birth: 1933/09/24           MRN: [id number] Visit Date: 04/03/2020              Requested by: Arnette Felts, FNP [id number] [place],  [place] [address] PCP: Arnette Felts, FNP   Assessment & Plan: Visit Diagnoses:  1. Acute pain of right knee   2. Pain in right ankle and joints of right foot   3. Unilateral primary osteoarthritis, right knee   4. Closed bimalleolar fracture of right ankle, initial encounter     Plan: Tammy [name] is accompanied by her sister.  She is about 5 months status post completion of viscosupplementation and notes it made a big difference.  She would like to try the injections again but we have to wait another month to complete the 30-month interval.  She fell over a month ago and is having more trouble with her knee and her ankle.  Films of her knee reveal no changes from her end-stage osteoarthritis.  However, she does have a nondisplaced mid right fibula fracture with callus and a nondisplaced medial malleolus fracture with callus.  Ankle mortise is intact.  She does wear regular shoes but has some pitting edema and uses a rolling walker.  I like to check her back in about 2 weeks.  Further films will not be necessary.  She will not require any immobilization.  Also discussed strengthening exercises.  Follow-Up Instructions: Return in about 2 weeks (around 04/17/2020).   Orders:  Orders Placed This Encounter  Procedures  . XR KNEE 3 VIEW RIGHT  . XR Ankle Complete Right   No orders of the defined types were placed in this encounter.     Procedures: No procedures performed   Clinical Data: No additional findings.   Subjective: Chief Complaint  Patient presents with  . Right Knee - Pain  . Right Ankle - Pain  Patient presents today for right knee and right ankle pain. She states that she fell a month ago and landed on her right side. She has been having pain in her knee  and ankle since. She states that both areas swell. She has pain anteriorly in her right knee and all throughout her ankle. The pain is worse at night. She takes Tylenol for pain relief. She has not had it evaluated anywhere before today.   HPI  Review of Systems   Objective: Vital Signs: Ht 5\' 7"  (1.702 m)   Wt 154 lb (69.9 kg)   BMI 24.12 kg/m   Physical Exam Constitutional:      Appearance: She is well-developed.  Eyes:     Pupils: Pupils are equal, round, and reactive to light.  Pulmonary:     Effort: Pulmonary effort is normal.  Skin:    General: Skin is warm and dry.  Neurological:     Mental Status: She is alert and oriented to person, place, and time.  Psychiatric:        Behavior: Behavior normal.     Ortho Exam awake and alert.  No distress.  Walks with a rolling walker only in the past month.  Positive effusion right knee with increased valgus with weightbearing and pain mostly on the lateral joint.  Not much change from prior evaluations.  Full knee extension and flexion now over 100 degrees.  Does open little bit medially  and slightly anteriorly.  No ecchymosis or skin changes.  The knee was not particularly warm.  Little bit of tenderness along the mid lateral leg and over the medial malleolus but no deformity.  There is some pitting edema of the ankle.  Foot was warm with good capillary refill Specialty Comments:  No specialty comments available.  Imaging: XR Ankle Complete Right  Result Date: 04/03/2020 Films of the right ankle and tibia were obtained in several projections.  Patient has a nondisplaced fracture of the medial malleolus with callus and a fracture of the middle third of the fibula with callus in good position.  XR KNEE 3 VIEW RIGHT  Result Date: 04/03/2020 Films of the right knee were obtained in 3 projections standing.  Patient had a recent fall.  No change from prior films from medical degenerative changes that are end-stage.  Arthritic changes  mostly in the lateral compartment with increased valgus.  Laterally there is narrowing of the joint space with subchondral sclerosis and peripheral osteophytes.  There are degenerative changes in the medial and patellofemoral compartments as well.  No ectopic calcification    PMFS History: Patient Active Problem List   Diagnosis Date Noted  . Ankle fracture, bimalleolar, closed 04/03/2020  . Unilateral primary osteoarthritis, right knee 05/29/2019  . Gait disturbance 07/04/2018  . Right knee pain 07/04/2018  . Alzheimer disease (HCC) 07/04/2018  . Iron deficiency 11/23/2017  . Mixed hyperlipidemia 11/23/2017  . Fatigue 11/23/2017  . Memory loss 11/23/2017  . History of falling 11/23/2017  . Chest pain 10/17/2016  . Leukocytosis 10/17/2016  . Diverticulosis of large intestine with hemorrhage   . Lower GI bleed   . Rectal bleeding 09/26/2015  . Essential hypertension 09/26/2015  . Acute blood loss anemia 09/26/2015  . Hypokalemia 09/26/2015  . HIP REPLACEMENT, RIGHT, HX OF 05/27/2008  . VITAMIN D DEFICIENCY 05/13/2008  . GERD 05/13/2008  . OSTEOARTHRITIS, HIPS, BILATERAL 05/13/2008   Past Medical History:  Diagnosis Date  . Acid reflux   . Glaucoma   . High cholesterol   . Hypertension     Family History  Problem Relation Age of Onset  . Hypertension Mother   . Hypertension Sister   . Hypertension Brother   . Heart disease Brother     Past Surgical History:  Procedure Laterality Date  . ABDOMINAL HYSTERECTOMY    . COLONOSCOPY N/A 09/30/2015   Procedure: COLONOSCOPY;  Surgeon: Jeani Hawking, MD;  Location: [facility];  Service: Endoscopy;  Laterality: N/A;  . JOINT REPLACEMENT    . REVISION TOTAL HIP ARTHROPLASTY Bilateral    2011 and 2012   Social History   Occupational History  . Occupation: Retired  Tobacco Use  . Smoking status: Never Smoker  . Smokeless tobacco: Never Used  Vaping Use  . Vaping Use: Never used  Substance and Sexual Activity  . Alcohol  use: No  . Drug use: No  . Sexual activity: Not Currently

## 2020-04-03 NOTE — Chronic Care Management (AMB) (Signed)
Chronic Care Management    Social Work Note  04/03/2020 Name: Tammy [name] MRN: [id number] DOB: [birth year]  Tammy [name] is a 87 y.o. year old female who is a primary care patient of Tammy [name], [place]. The CCM team was consulted to assist the patient with chronic disease management and/or care coordination needs related to: Level of Care Concerns.   Engaged with patients POA Tammy [name] by phone for re-establishment of enrollment in program in response to provider referral for social work chronic care management and care coordination services.   Consent to Services:  The patient was given the following information about Chronic Care Management services today, agreed to services, and gave verbal consent: 1. CCM service includes personalized support from designated clinical staff supervised by the primary care provider, including individualized plan of care and coordination with other care providers 2. 24/7 contact phone numbers for assistance for urgent and routine care needs. 3. Service will only be billed when office clinical staff spend 20 minutes or more in a month to coordinate care. 4. Only one practitioner may furnish and bill the service in a calendar month. 5.The patient may stop CCM services at any time (effective at the end of the month) by phone call to the office staff. 6. The patient will be responsible for cost sharing (co-pay) of up to 20% of the service fee (after annual deductible is met). Patient agreed to services and consent obtained.  Patient agreed to services and consent obtained.   Assessment: Review of patient past medical history, allergies, medications, and health status, including review of relevant consultants reports was performed today as part of a comprehensive evaluation and provision of chronic care management and care coordination services.     SDOH (Social Determinants of Health) assessments and interventions performed:    Advanced Directives Status:  Not addressed in this encounter.  CCM Care Plan  Allergies  Allergen Reactions  . Advil [Ibuprofen] Itching  . Ciprofloxacin Other (See Comments)    REACTION: hives  . Zolpidem Tartrate Other (See Comments)    REACTION: trembling  . Unisom [Doxylamine] Swelling and Rash    Outpatient Encounter Medications as of 04/02/2020  Medication Sig  . acetaminophen (TYLENOL) 500 MG tablet Take 500 mg by mouth every 6 (six) hours as needed for mild pain.  [name] amlodipine-olmesartan (AZOR) 10-20 MG tablet TAKE 1 TABLET BY MOUTH ONCE DAILY.  . bimatoprost (LUMIGAN) 0.03 % ophthalmic solution Place 1 drop into both eyes at bedtime.  . calcium-vitamin D (OSCAL WITH D) 500-200 MG-UNIT per tablet Take 1 tablet by mouth daily.  . diclofenac Sodium (VOLTAREN) 1 % GEL Apply 2 g topically 4 (four) times daily.  [name] donepezil (ARICEPT) 10 MG tablet TAKE ONE TABLET BY MOUTH AT BEDTIME.  . hydrOXYzine (ATARAX/VISTARIL) 25 MG tablet Take 1 tablet (25 mg total) by mouth daily as needed. For agitation  . Magnesium 250 MG TABS Take 1 tablet by mouth with evening meal  . omeprazole (PRILOSEC) 20 MG capsule TAKE (1) CAPSULE BY MOUTH ONCE DAILY.   No facility-administered encounter medications on file as of 04/02/2020.    Patient Active Problem List   Diagnosis Date Noted  . Unilateral primary osteoarthritis, right knee 05/29/2019  . Gait disturbance 07/04/2018  . Right knee pain 07/04/2018  . Alzheimer disease ([place]) 07/04/2018  . Iron deficiency 11/23/2017  . Mixed hyperlipidemia 11/23/2017  . Fatigue 11/23/2017  . Memory loss 11/23/2017  . History of falling 11/23/2017  . Chest pain 10/17/2016  .  Leukocytosis 10/17/2016  . Diverticulosis of large intestine with hemorrhage   . Lower GI bleed   . Rectal bleeding 09/26/2015  . Essential hypertension 09/26/2015  . Acute blood loss anemia 09/26/2015  . Hypokalemia 09/26/2015  . HIP REPLACEMENT, RIGHT, HX OF 05/27/2008  . VITAMIN D DEFICIENCY 05/13/2008  . GERD  05/13/2008  . OSTEOARTHRITIS, HIPS, BILATERAL 05/13/2008    Conditions to be addressed/monitored: HTN and Alzheimer's Disease; Level of care concerns and Memory Deficits  Care Plan : Social Work [facility] Care Plan  Updates made by Daneen Schick since 04/03/2020 12:00 AM    Problem: Long-Term Care Planning     Long-Range Goal: Effective Long-Term Care Planning   Start Date: 04/02/2020  Expected End Date: 07/02/2020  This Visit's Progress: On track  Priority: Medium  Note:   Late entry from 3.9.22 Current Barriers:  . Chronic disease management support and education needs related to HTN and Alzheimer's Disease   . Level of care concerns as memory continues to decline . ADL IADL limitations  Social Worker Clinical Goal(s):  [name] Over the next 90 days, patient and her POA Tammy [name] will work with SW to identify and address any acute and/or chronic care coordination needs related to the self health management of HTN and Alzheimer's Disease   . Over the next 120 days the patient and her POA Tammy [name] will work with SW to identify effective long term care planning  CCM SW Interventions:  . Inter-disciplinary care team collaboration (see longitudinal plan of care) . Collaboration with Tammy Brine, FNP regarding development and update of comprehensive plan of care as evidenced by provider attestation and co-signature . Successful outbound call placed to the patients [place] to assess for care coordination needs . Discussed the patient continues to do well in the home she shares with her sister Tammy [name] . Determined the patient experienced a fall in February due to trying to break up an argument between her siblings - since the patient has been seen in office for pain to her right ankle. The patients brother has also left the home which has alleviated stress . Assessed for interest in placement - Tammy [name] reports placement may be a long-term goal but at this time she feels the patient is stable  in the home . Discussed long term follow up with SW while patient remains actively involved with  RN Case Manager  to address care management needs . Scheduled follow up call over the next 60 days  Patient Goals/Self-Care Activities . Over the next 60 days, patient will: With the help of her [place]  - Patient will self administer medications as prescribed Patient will attend all scheduled provider appointments Patient will call provider office for new concerns or questions Contact SW as needed prior to next scheduled call  Follow Up Plan:  SW will outreach over the next 60 days to continue discussion of long term care planning       Follow Up Plan: SW will follow up with patient by phone over the next 60 days.      Daneen Schick, BSW, CDP Social Worker, Certified Dementia Practitioner [place] [facility] [phone]  Total time spent performing care coordination and/or care management activities with the patient by phone or face to face = 39 minutes.

## 2020-04-03 NOTE — Telephone Encounter (Signed)
Please precert for right knee visco injections once she is eligible. This is Dr.Whitfield's patient. Thanks!

## 2020-04-07 ENCOUNTER — Telehealth: Payer: Self-pay

## 2020-04-07 NOTE — Telephone Encounter (Signed)
Submitted VOB for Orthovisc, right knee. Pending BV

## 2020-04-09 ENCOUNTER — Telehealth: Payer: Self-pay | Admitting: Nurse Practitioner

## 2020-04-09 NOTE — Telephone Encounter (Signed)
Left message for patient to call back and schedule Medicare Annual Wellness Visit (AWV) either virtually or in office. No detailed message left    Last AWV 08/30/2018  please schedule at anytime with [facility]    This should be a 45 minute visit.

## 2020-04-30 ENCOUNTER — Telehealth: Payer: Medicare Other

## 2020-05-21 ENCOUNTER — Other Ambulatory Visit: Payer: Self-pay | Admitting: Nurse Practitioner

## 2020-05-21 DIAGNOSIS — G3184 Mild cognitive impairment, so stated: Secondary | ICD-10-CM

## 2020-05-21 DIAGNOSIS — I10 Essential (primary) hypertension: Secondary | ICD-10-CM

## 2020-05-27 ENCOUNTER — Telehealth: Payer: Self-pay

## 2020-05-27 NOTE — Telephone Encounter (Signed)
Left a VM for patient to CB to schedule for gel injection with Dr. Durward Fortes.  Approved, Orthovisc, right knee. [place] deductible has been met Secondary insurance [employer], will pick up remaining eligible expenses at 100%. No Co-pay No PA required

## 2020-06-02 ENCOUNTER — Telehealth: Payer: Medicare Other

## 2020-06-09 ENCOUNTER — Telehealth: Payer: Self-pay | Admitting: Orthopaedic Surgery

## 2020-06-09 NOTE — Telephone Encounter (Signed)
error 

## 2020-06-10 ENCOUNTER — Telehealth: Payer: Self-pay

## 2020-06-10 ENCOUNTER — Telehealth: Payer: Medicare Other

## 2020-06-10 NOTE — Telephone Encounter (Signed)
  Care Management   Follow Up Note   06/10/2020 Name: Tammy [name] MRN: [id number] DOB: 06/18/1933   Referred by: Arnette Felts, FNP Reason for referral : Chronic Care Management   An unsuccessful telephone outreach was attempted today. The patient was referred to the case management team for assistance with care management and care coordination.   Follow Up Plan: A HIPPA compliant phone message was left for the patient providing contact information and requesting a return call. The Care Management team will attempt to contact the patient over the next 21 days.  [name], BSW, CDP Social Worker, Certified Dementia Practitioner [facility] Care Management [phone]

## 2020-06-19 ENCOUNTER — Ambulatory Visit: Payer: Medicare Other

## 2020-06-19 ENCOUNTER — Telehealth: Payer: Self-pay

## 2020-06-19 NOTE — Telephone Encounter (Signed)
This nurse called patient in regards to missed AWV. Message left to call back in order to reschedule for another time.

## 2020-06-24 ENCOUNTER — Ambulatory Visit (INDEPENDENT_AMBULATORY_CARE_PROVIDER_SITE_OTHER): Payer: Medicare Other | Admitting: Family Medicine

## 2020-06-24 ENCOUNTER — Telehealth: Payer: Medicare Other

## 2020-06-24 ENCOUNTER — Telehealth: Payer: Self-pay

## 2020-06-24 ENCOUNTER — Other Ambulatory Visit: Payer: Self-pay

## 2020-06-24 ENCOUNTER — Ambulatory Visit: Payer: Medicare Other | Admitting: Orthopaedic Surgery

## 2020-06-24 ENCOUNTER — Encounter: Payer: Self-pay | Admitting: Family Medicine

## 2020-06-24 DIAGNOSIS — M1711 Unilateral primary osteoarthritis, right knee: Secondary | ICD-10-CM

## 2020-06-24 NOTE — Progress Notes (Signed)
S:  Here for planned right knee Orthovisc injection.  O:  Trace effusion without warmth.  Impression:  Right knee DJD.  Plan:  Orthovisc injected with 3 cc 1% lidocaine without epi from superolateral approach after sterile prep.  Return as needed.

## 2020-06-24 NOTE — Telephone Encounter (Signed)
  Care Management   Follow Up Note   06/24/2020 Name: Tammy [name] MRN: [id number] DOB: May 03, 1933   Referred by: Arnette Felts, FNP Reason for referral : Chronic Care Management (RNCM Initial re-enrollment Call Attempt )   An unsuccessful telephone outreach was attempted today. The patient was referred to the case management team for assistance with care management and care coordination.   Follow Up Plan: A HIPPA compliant phone message was left for the patient providing contact information and requesting a return call.   Delsa Sale, RN, BSN, CCM Care Management Coordinator [facility] Care Management/[facility]  Direct Phone: [phone]

## 2020-07-01 ENCOUNTER — Ambulatory Visit (INDEPENDENT_AMBULATORY_CARE_PROVIDER_SITE_OTHER): Payer: Medicare Other | Admitting: Orthopaedic Surgery

## 2020-07-01 ENCOUNTER — Encounter: Payer: Self-pay | Admitting: Orthopaedic Surgery

## 2020-07-01 ENCOUNTER — Ambulatory Visit (INDEPENDENT_AMBULATORY_CARE_PROVIDER_SITE_OTHER): Payer: Medicare Other

## 2020-07-01 DIAGNOSIS — F0280 Dementia in other diseases classified elsewhere without behavioral disturbance: Secondary | ICD-10-CM | POA: Diagnosis not present

## 2020-07-01 DIAGNOSIS — E782 Mixed hyperlipidemia: Secondary | ICD-10-CM

## 2020-07-01 DIAGNOSIS — M1711 Unilateral primary osteoarthritis, right knee: Secondary | ICD-10-CM

## 2020-07-01 DIAGNOSIS — I10 Essential (primary) hypertension: Secondary | ICD-10-CM

## 2020-07-01 DIAGNOSIS — G301 Alzheimer's disease with late onset: Secondary | ICD-10-CM | POA: Diagnosis not present

## 2020-07-01 MED ORDER — HYALURONAN 30 MG/2ML IX SOSY
30.0000 mg | PREFILLED_SYRINGE | INTRA_ARTICULAR | Status: AC | PRN
  Administered 2020-07-01: 30 mg via INTRA_ARTICULAR

## 2020-07-01 NOTE — Chronic Care Management (AMB) (Signed)
Chronic Care Management    Social Work Note  07/01/2020 Name: Tammy [name] MRN: [id number] DOB: Oct 30, 1933  Tammy [name] is a 87 y.o. year old female who is a primary care patient of Arnette Felts, FNP. The CCM team was consulted to assist the patient with chronic disease management and/or care coordination needs related to: Level of Care Concerns.   Engaged with patients POA Tammy [name] by phone for follow up visit in response to provider referral for social work chronic care management and care coordination services.   Consent to Services:  The patient was given information about Chronic Care Management services, agreed to services, and gave verbal consent prior to initiation of services.  Please see initial visit note for detailed documentation.   Patient agreed to services and consent obtained.   Assessment: Review of patient past medical history, allergies, medications, and health status, including review of relevant consultants reports was performed today as part of a comprehensive evaluation and provision of chronic care management and care coordination services.     SDOH (Social Determinants of Health) assessments and interventions performed:  SDOH Interventions   Flowsheet Row Most Recent Value  SDOH Interventions   Food Insecurity Interventions Intervention Not Indicated  Housing Interventions Intervention Not Indicated  Transportation Interventions Intervention Not Indicated       Advanced Directives Status: Not addressed in this encounter.  CCM Care Plan  Allergies  Allergen Reactions  . Advil [Ibuprofen] Itching  . Ciprofloxacin Other (See Comments)    REACTION: hives  . Zolpidem Tartrate Other (See Comments)    REACTION: trembling  . Unisom [Doxylamine] Swelling and Rash    Outpatient Encounter Medications as of 07/01/2020  Medication Sig  . acetaminophen (TYLENOL) 500 MG tablet Take 500 mg by mouth every 6 (six) hours as needed for mild pain.  [name]  amlodipine-olmesartan (AZOR) 10-20 MG tablet TAKE 1 TABLET BY MOUTH ONCE DAILY.  . bimatoprost (LUMIGAN) 0.03 % ophthalmic solution Place 1 drop into both eyes at bedtime.  . calcium-vitamin D (OSCAL WITH D) 500-200 MG-UNIT per tablet Take 1 tablet by mouth daily.  . diclofenac Sodium (VOLTAREN) 1 % GEL Apply 2 g topically 4 (four) times daily.  [name] donepezil (ARICEPT) 10 MG tablet TAKE ONE TABLET BY MOUTH AT BEDTIME.  . hydrOXYzine (ATARAX/VISTARIL) 25 MG tablet Take 1 tablet (25 mg total) by mouth daily as needed. For agitation  . Magnesium 250 MG TABS Take 1 tablet by mouth with evening meal  . omeprazole (PRILOSEC) 20 MG capsule TAKE (1) CAPSULE BY MOUTH ONCE DAILY.   No facility-administered encounter medications on file as of 07/01/2020.    Patient Active Problem List   Diagnosis Date Noted  . Ankle fracture, bimalleolar, closed 04/03/2020  . Unilateral primary osteoarthritis, right knee 05/29/2019  . Gait disturbance 07/04/2018  . Right knee pain 07/04/2018  . Alzheimer disease (HCC) 07/04/2018  . Iron deficiency 11/23/2017  . Mixed hyperlipidemia 11/23/2017  . Fatigue 11/23/2017  . Memory loss 11/23/2017  . History of falling 11/23/2017  . Chest pain 10/17/2016  . Leukocytosis 10/17/2016  . Diverticulosis of large intestine with hemorrhage   . Lower GI bleed   . Rectal bleeding 09/26/2015  . Essential hypertension 09/26/2015  . Acute blood loss anemia 09/26/2015  . Hypokalemia 09/26/2015  . HIP REPLACEMENT, RIGHT, HX OF 05/27/2008  . VITAMIN D DEFICIENCY 05/13/2008  . GERD 05/13/2008  . OSTEOARTHRITIS, HIPS, BILATERAL 05/13/2008    Conditions to be addressed/monitored: HTN, Mixed Hyperlipidemia, and  Alzheimer's Disease; Level of care concerns  Care Plan : Social Work [facility] Care Plan  Updates made by [name] since 07/01/2020 12:00 AM    Problem: Long-Term Care Planning     Long-Range Goal: Effective Long-Term Care Planning   Start Date: 04/02/2020  Expected End  Date: 10/29/2020  This Visit's Progress: On track  Recent Progress: On track  Priority: Low  Note:   Current Barriers:  . Chronic disease management support and education needs related to HTN and Alzheimer's Disease   . Level of care concerns as memory continues to decline . ADL IADL limitations  Social Worker Clinical Goal(s):  .  patient and her POA Tammy [name] will work with SW to identify and address any acute and/or chronic care coordination needs related to the self health management of HTN and Alzheimer's Disease   . patient and her POA Tammy [name] will work with SW to identify effective long term care planning  CCM SW Interventions:  . Inter-disciplinary care team collaboration (see longitudinal plan of care) . Collaboration with Arnette Felts, FNP regarding development and update of comprehensive plan of care as evidenced by provider attestation and co-signature . Successful outbound call placed to the patients POA Tammy [name] to assess for care coordination needs . Discussed the patient continues to do well in the home she shares with her sister Tammy [name] . Patient received second injection to her knee today - [name] indicates the knee injections are helping to control pain for the patient . Determined placement is not warranted at this time as patient remains stable in her current environment . Discussed long term follow up with SW to assist with care coordination needs . Scheduled follow up call over the next 90 days  Patient Goals/Self-Care Activities .  patient will: With the help of her POA Tammy [name]  - Patient will call provider office for new concerns or questions Contact SW as needed prior to next scheduled call  Follow Up Plan:  SW will outreach over the next 90 days        Follow Up Plan: SW will follow up with patient by phone over the next 90 days.      [name], BSW, CDP Social Worker, Certified Dementia Practitioner [facility] Care  Management [phone]  Total time spent performing care coordination and/or care management activities with the patient by phone or face to face = 20 minutes.

## 2020-07-01 NOTE — Progress Notes (Signed)
Office Visit Note   Patient: Tammy [name]           Date of Birth: 10-Sep-1933           MRN: [id number] Visit Date: 07/01/2020              Requested by: Arnette Felts, FNP [address] [place],  [place] [address] PCP: Arnette Felts, FNP   Assessment & Plan: Visit Diagnoses:  1. Unilateral primary osteoarthritis, right knee     Plan: Second Orthovisc injection right knee.  No problems with the first.  Return next week to complete the series of 3  Follow-Up Instructions: Return in about 1 week (around 07/08/2020).   Orders:  No orders of the defined types were placed in this encounter.  No orders of the defined types were placed in this encounter.     Procedures: Large Joint Inj: R knee on 07/01/2020 1:33 PM Indications: pain and joint swelling Details: 25 G 1.5 in needle  Arthrogram: No  Medications: 30 mg Hyaluronan 30 MG/2ML Outcome: tolerated well, no immediate complications Procedure, treatment alternatives, risks and benefits explained, specific risks discussed. Consent was given by the patient. Immediately prior to procedure a time out was called to verify the correct patient, procedure, equipment, support staff and site/side marked as required. Patient was prepped and draped in the usual sterile fashion.       Clinical Data: No additional findings.   Subjective: Chief Complaint  Patient presents with  . Right Knee - Pain  First Orthovisc injection last week without a problem returns today for second injection  HPI  Review of Systems   Objective: Vital Signs: There were no vitals taken for this visit.  Physical Exam  Ortho Exam right knee was not hot red warm or swollen.  Specialty Comments:  No specialty comments available.  Imaging: No results found.   PMFS History: Patient Active Problem List   Diagnosis Date Noted  . Ankle fracture, bimalleolar, closed 04/03/2020  . Unilateral primary osteoarthritis, right knee  05/29/2019  . Gait disturbance 07/04/2018  . Right knee pain 07/04/2018  . Alzheimer disease (HCC) 07/04/2018  . Iron deficiency 11/23/2017  . Mixed hyperlipidemia 11/23/2017  . Fatigue 11/23/2017  . Memory loss 11/23/2017  . History of falling 11/23/2017  . Chest pain 10/17/2016  . Leukocytosis 10/17/2016  . Diverticulosis of large intestine with hemorrhage   . Lower GI bleed   . Rectal bleeding 09/26/2015  . Essential hypertension 09/26/2015  . Acute blood loss anemia 09/26/2015  . Hypokalemia 09/26/2015  . HIP REPLACEMENT, RIGHT, HX OF 05/27/2008  . VITAMIN D DEFICIENCY 05/13/2008  . GERD 05/13/2008  . OSTEOARTHRITIS, HIPS, BILATERAL 05/13/2008   Past Medical History:  Diagnosis Date  . Acid reflux   . Glaucoma   . High cholesterol   . Hypertension     Family History  Problem Relation Age of Onset  . Hypertension Mother   . Hypertension Sister   . Hypertension Brother   . Heart disease Brother     Past Surgical History:  Procedure Laterality Date  . ABDOMINAL HYSTERECTOMY    . COLONOSCOPY N/A 09/30/2015   Procedure: COLONOSCOPY;  Surgeon: Jeani Hawking, MD;  Location: [facility];  Service: Endoscopy;  Laterality: N/A;  . JOINT REPLACEMENT    . REVISION TOTAL HIP ARTHROPLASTY Bilateral    2011 and 2012   Social History   Occupational History  . Occupation: Retired  Tobacco Use  . Smoking status:  Never Smoker  . Smokeless tobacco: Never Used  Vaping Use  . Vaping Use: Never used  Substance and Sexual Activity  . Alcohol use: No  . Drug use: No  . Sexual activity: Not Currently     Valeria Batman, MD   Note - This record has been created using [employer].  Chart creation errors have been sought, but may not always  have been located. Such creation errors do not reflect on  the standard of medical care.

## 2020-07-01 NOTE — Patient Instructions (Signed)
Social Worker Visit Information  Goals we discussed today:  Goals Addressed            This Visit's Progress   . Effective Long-Term Care Planning       Timeframe:  Long-Range Goal Priority:  Low Start Date:   3.9.22                          Expected End Date: 10.58.22                    Next planned outreach: 9.7.22  Patient Goals/Self-Care Activities . Over the next 60 days, patient will: With the help of her POA [name]  - Patient will self administer medications as prescribed Patient will attend all scheduled provider appointments Patient will call provider office for new concerns or questions Contact SW as needed prior to next scheduled call        Follow Up Plan: SW will follow up with patient by phone over the next 90 days   [name], BSW, CDP Social Worker, Certified Dementia Practitioner [facility] Care Management [phone]

## 2020-07-08 ENCOUNTER — Ambulatory Visit (INDEPENDENT_AMBULATORY_CARE_PROVIDER_SITE_OTHER): Payer: Medicare Other | Admitting: Orthopaedic Surgery

## 2020-07-08 ENCOUNTER — Encounter: Payer: Self-pay | Admitting: Orthopaedic Surgery

## 2020-07-08 ENCOUNTER — Other Ambulatory Visit: Payer: Self-pay

## 2020-07-08 VITALS — Ht 67.0 in | Wt 154.0 lb

## 2020-07-08 DIAGNOSIS — M1711 Unilateral primary osteoarthritis, right knee: Secondary | ICD-10-CM

## 2020-07-08 NOTE — Progress Notes (Addendum)
Office Visit Note   Patient: Tammy [name]           Date of Birth: 04-23-1933           MRN: [id number] Visit Date: 07/08/2020              Requested by: Arnette Felts, FNP [address] [place],  [place] [address] PCP: Arnette Felts, FNP   Assessment & Plan: Visit Diagnoses:  1. Unilateral primary osteoarthritis, right knee     Plan third and final Orthovisc injection right knee.  Has had a good response  Follow-Up Instructions: Return if symptoms worsen or fail to improve.   Orders:  No orders of the defined types were placed in this encounter.  No orders of the defined types were placed in this encounter.     Procedures: Large Joint Inj: R knee on 07/08/2020 3:34 PM Indications: pain and joint swelling Details: 25 G 1.5 in needle  Arthrogram: No  Outcome: tolerated well, no immediate complications Procedure, treatment alternatives, risks and benefits explained, specific risks discussed. Consent was given by the patient. Immediately prior to procedure a time out was called to verify the correct patient, procedure, equipment, support staff and site/side marked as required. Patient was prepped and draped in the usual sterile fashion.      Clinical Data: No additional findings.   Subjective: Chief Complaint  Patient presents with   Right Knee - Follow-up    Orthovisc started 06/24/2020   Patient presents today for the third orthovisc injection in her right knee. She started the series on 06/24/2020. No complications from the other injections and wishes to get the last one today.   HPI  Review of Systems   Objective: Vital Signs: Ht 5\' 7"  (1.702 m)   Wt 154 lb (69.9 kg)   BMI 24.12 kg/m   Physical Exam  Ortho Exam right knee was not hot red warm or swollen and minimal discomfort  Specialty Comments:  No specialty comments available.  Imaging: No results found.   PMFS History: Patient Active Problem List   Diagnosis Date Noted    Ankle fracture, bimalleolar, closed 04/03/2020   Unilateral primary osteoarthritis, right knee 05/29/2019   Gait disturbance 07/04/2018   Right knee pain 07/04/2018   Alzheimer disease (HCC) 07/04/2018   Iron deficiency 11/23/2017   Mixed hyperlipidemia 11/23/2017   Fatigue 11/23/2017   Memory loss 11/23/2017   History of falling 11/23/2017   Chest pain 10/17/2016   Leukocytosis 10/17/2016   Diverticulosis of large intestine with hemorrhage    Lower GI bleed    Rectal bleeding 09/26/2015   Essential hypertension 09/26/2015   Acute blood loss anemia 09/26/2015   Hypokalemia 09/26/2015   HIP REPLACEMENT, RIGHT, HX OF 05/27/2008   VITAMIN D DEFICIENCY 05/13/2008   GERD 05/13/2008   OSTEOARTHRITIS, HIPS, BILATERAL 05/13/2008   Past Medical History:  Diagnosis Date   Acid reflux    Glaucoma    High cholesterol    Hypertension     Family History  Problem Relation Age of Onset   Hypertension Mother    Hypertension Sister    Hypertension Brother    Heart disease Brother     Past Surgical History:  Procedure Laterality Date   ABDOMINAL HYSTERECTOMY     COLONOSCOPY N/A 09/30/2015   Procedure: COLONOSCOPY;  Surgeon: 11/30/2015, MD;  Location: [facility];  Service: Endoscopy;  Laterality: N/A;   JOINT REPLACEMENT     REVISION TOTAL HIP ARTHROPLASTY  Bilateral    2011 and 2012   Social History   Occupational History   Occupation: Retired  Tobacco Use   Smoking status: Never   Smokeless tobacco: Never  Vaping Use   Vaping Use: Never used  Substance and Sexual Activity   Alcohol use: No   Drug use: No   Sexual activity: Not Currently

## 2020-07-17 ENCOUNTER — Other Ambulatory Visit: Payer: Self-pay | Admitting: Nurse Practitioner

## 2020-07-17 DIAGNOSIS — I10 Essential (primary) hypertension: Secondary | ICD-10-CM

## 2020-08-04 ENCOUNTER — Telehealth: Payer: Medicare Other

## 2020-08-04 ENCOUNTER — Telehealth: Payer: Self-pay

## 2020-08-04 NOTE — Telephone Encounter (Signed)
  Care Management   Follow Up Note   08/04/2020 Name: Tammy [name] MRN: [id number] DOB: [birth year]   Referred by: Arnette Felts, FNP Reason for referral : Chronic Care Management (Initial RN CM Outreach - re-enrollment 3rd attempt )   A second unsuccessful telephone outreach was attempted today. The patient was referred to the case management team for assistance with care management and care coordination.   Follow Up Plan: A HIPPA compliant phone message was left for the patient providing contact information and requesting a return call.   Delsa Sale, RN, BSN, CCM Care Management Coordinator [facility] Care Management/[facility]  Direct Phone: [phone]

## 2020-08-05 ENCOUNTER — Telehealth: Payer: Self-pay | Admitting: Nurse Practitioner

## 2020-08-05 NOTE — Telephone Encounter (Signed)
Left message for patient to call back and schedule Medicare Annual Wellness Visit (AWV) either virtually or in office.   Last AWV ;08/30/2018  please schedule at anytime with [facility]    This should be a 45 minute visit.

## 2020-09-04 ENCOUNTER — Telehealth: Payer: Medicare Other

## 2020-09-04 ENCOUNTER — Ambulatory Visit: Payer: Self-pay

## 2020-09-04 DIAGNOSIS — F0280 Dementia in other diseases classified elsewhere without behavioral disturbance: Secondary | ICD-10-CM

## 2020-09-04 DIAGNOSIS — I2510 Atherosclerotic heart disease of native coronary artery without angina pectoris: Secondary | ICD-10-CM

## 2020-09-04 DIAGNOSIS — I10 Essential (primary) hypertension: Secondary | ICD-10-CM

## 2020-09-04 DIAGNOSIS — E782 Mixed hyperlipidemia: Secondary | ICD-10-CM

## 2020-09-04 NOTE — Chronic Care Management (AMB) (Signed)
  Care Management   Follow Up Note   09/04/2020 Name: Tammy [name] MRN: [id number] DOB: 05/11/1933   Referred by: Arnette Felts, FNP Reason for referral : No chief complaint on file.   Third unsuccessful telephone outreach was attempted today. The patient was referred to the case management team for assistance with care management and care coordination. The patient's primary care provider has been notified of our unsuccessful attempts to make or maintain contact with the patient. The care management team is pleased to engage with this patient at any time in the future should he/she be interested in assistance from the care management team.   Follow Up Plan: We have been unable to make contact with the patient for follow up. The care management team is available to follow up with the patient after provider conversation with the patient regarding recommendation for care management engagement and subsequent re-referral to the care management team.    Delsa Sale, RN, BSN, CCM Care Management Coordinator [facility] Care Management/[facility]  Direct Phone: [phone]

## 2020-09-16 ENCOUNTER — Telehealth: Payer: Self-pay | Admitting: Nurse Practitioner

## 2020-09-16 NOTE — Telephone Encounter (Signed)
Left message for patient to call back and schedule Medicare Annual Wellness Visit (AWV) either virtually or in office.   Left  my Zachery Conch number [phone]   Last AWV 08/30/2018 please schedule at anytime with [facility]    This should be a 45 minute visit.

## 2020-09-17 ENCOUNTER — Ambulatory Visit: Payer: Medicare Other | Admitting: Nurse Practitioner

## 2020-10-01 ENCOUNTER — Telehealth: Payer: Medicare Other

## 2020-10-10 ENCOUNTER — Ambulatory Visit (INDEPENDENT_AMBULATORY_CARE_PROVIDER_SITE_OTHER): Payer: Medicare Other

## 2020-10-10 DIAGNOSIS — F0280 Dementia in other diseases classified elsewhere without behavioral disturbance: Secondary | ICD-10-CM

## 2020-10-10 DIAGNOSIS — I10 Essential (primary) hypertension: Secondary | ICD-10-CM

## 2020-10-10 DIAGNOSIS — E782 Mixed hyperlipidemia: Secondary | ICD-10-CM

## 2020-10-10 NOTE — Chronic Care Management (AMB) (Signed)
Chronic Care Management    Social Work Note  10/10/2020 Name: Tammy [name] MRN: [id number] DOB: 28-Dec-1933  Tammy [name] is a 87 y.o. year old female who is a primary care patient of Arnette Felts, FNP. The CCM team was consulted to assist the patient with chronic disease management and/or care coordination needs related to:  HTN, Mixed Hyperlipidemia, and Alzheimer's Disease .   Engaged with patients POA [name] by phone  for follow up visit in response to provider referral for social work chronic care management and care coordination services.   Consent to Services:  The patient was given information about Chronic Care Management services, agreed to services, and gave verbal consent prior to initiation of services.  Please see initial visit note for detailed documentation.   Patient agreed to services and consent obtained.   Assessment: Review of patient past medical history, allergies, medications, and health status, including review of relevant consultants reports was performed today as part of a comprehensive evaluation and provision of chronic care management and care coordination services.     SDOH (Social Determinants of Health) assessments and interventions performed:    Advanced Directives Status: Not addressed in this encounter.  CCM Care Plan  Allergies  Allergen Reactions   Advil [Ibuprofen] Itching   Ciprofloxacin Other (See Comments)    REACTION: hives   Zolpidem Tartrate Other (See Comments)    REACTION: trembling   Unisom [Doxylamine] Swelling and Rash    Outpatient Encounter Medications as of 10/10/2020  Medication Sig   acetaminophen (TYLENOL) 500 MG tablet Take 500 mg by mouth every 6 (six) hours as needed for mild pain.   amlodipine-olmesartan (AZOR) 10-20 MG tablet TAKE 1 TABLET BY MOUTH ONCE DAILY.   bimatoprost (LUMIGAN) 0.03 % ophthalmic solution Place 1 drop into both eyes at bedtime.   calcium-vitamin D (OSCAL WITH D) 500-200 MG-UNIT per  tablet Take 1 tablet by mouth daily.   diclofenac Sodium (VOLTAREN) 1 % GEL Apply 2 g topically 4 (four) times daily.   donepezil (ARICEPT) 10 MG tablet TAKE ONE TABLET BY MOUTH AT BEDTIME.   hydrOXYzine (ATARAX/VISTARIL) 25 MG tablet Take 1 tablet (25 mg total) by mouth daily as needed. For agitation   Magnesium 250 MG TABS Take 1 tablet by mouth with evening meal   omeprazole (PRILOSEC) 20 MG capsule TAKE (1) CAPSULE BY MOUTH ONCE DAILY.   No facility-administered encounter medications on file as of 10/10/2020.    Patient Active Problem List   Diagnosis Date Noted   Ankle fracture, bimalleolar, closed 04/03/2020   Unilateral primary osteoarthritis, right knee 05/29/2019   Gait disturbance 07/04/2018   Right knee pain 07/04/2018   Alzheimer disease (HCC) 07/04/2018   Iron deficiency 11/23/2017   Mixed hyperlipidemia 11/23/2017   Fatigue 11/23/2017   Memory loss 11/23/2017   History of falling 11/23/2017   Chest pain 10/17/2016   Leukocytosis 10/17/2016   Diverticulosis of large intestine with hemorrhage    Lower GI bleed    Rectal bleeding 09/26/2015   Essential hypertension 09/26/2015   Acute blood loss anemia 09/26/2015   Hypokalemia 09/26/2015   HIP REPLACEMENT, RIGHT, HX OF 05/27/2008   VITAMIN D DEFICIENCY 05/13/2008   GERD 05/13/2008   OSTEOARTHRITIS, HIPS, BILATERAL 05/13/2008    Conditions to be addressed/monitored: HTN, Dementia, and Mixed Hyperlipidemia  Care Plan : Social Work [facility] Care Plan  Updates made by [name] since 10/10/2020 12:00 AM  Completed 10/10/2020   Problem: Long-Term Care Planning Resolved 10/10/2020  Long-Range Goal: Effective Long-Term Care Planning Completed 10/10/2020  Start Date: 04/02/2020  Expected End Date: 10/29/2020  Recent Progress: On track  Priority: Low  Note:   Current Barriers:  Chronic disease management support and education needs related to HTN and Alzheimer's Disease   Level of care concerns as memory continues to  decline ADL IADL limitations  Social Worker Clinical Goal(s):   patient and her POA [name] will work with SW to identify and address any acute and/or chronic care coordination needs related to the self health management of HTN and Alzheimer's Disease   patient and her POA [name] will work with SW to identify effective long term care planning  CCM SW Interventions:  Inter-disciplinary care team collaboration (see longitudinal plan of care) Collaboration with Arnette Felts, FNP regarding development and update of comprehensive plan of care as evidenced by provider attestation and co-signature Successful outbound call placed to the patients POA [name] to assess for care coordination needs Discussed the patient continues to do well in the home she lives in with her sister Orpha Bur Determined the patient requires more verbal cueing for reminders to bathe and change clothes Discussed the family would like for the patient to remain in her home as long as possible Encouraged [name] to contact the care management team as needed with future patient care coordination needs  Patient Goals/Self-Care Activities  patient will: With the help of her POA [name]  - Patient will call provider office for new concerns or questions Contact the care management team as needed        Follow Up Plan:  No SW follow up planned at this time. The care management team is available as needed with future needs.      [name], BSW, CDP Social Worker, Certified Dementia Practitioner [facility] Care Management [phone]

## 2020-10-10 NOTE — Patient Instructions (Signed)
Social Worker Visit Information  Goals we discussed today:   Goals Addressed             This Visit's Progress    COMPLETED: Effective Long-Term Care Planning       Timeframe:  Long-Range Goal Priority:  Low Start Date:   3.9.22                          Expected End Date: 10.58.22                     Patient Goals/Self-Care Activities  patient will: With the help of her POA [name]  - Patient will call provider office for new concerns or questions Contact the care management team as needed         Patient verbalizes understanding of instructions provided today and agrees to view in MyChart.   Follow Up Plan:  No follow up planned at this time. Please contact me as needed.   [name], BSW, CDP Social Worker, Certified Dementia Practitioner [facility] Care Management [phone]

## 2020-10-12 ENCOUNTER — Telehealth: Payer: Self-pay

## 2020-10-12 ENCOUNTER — Encounter: Payer: Self-pay | Admitting: Nurse Practitioner

## 2020-10-12 NOTE — Telephone Encounter (Signed)
Att to contact pt to complete AWV no ans lvm, tried calling 2x phone went to voicemail

## 2020-10-15 ENCOUNTER — Encounter: Payer: Self-pay | Admitting: Nurse Practitioner

## 2020-10-16 ENCOUNTER — Telehealth: Payer: Medicare Other | Admitting: Nurse Practitioner

## 2020-10-23 ENCOUNTER — Encounter: Payer: Self-pay | Admitting: Nurse Practitioner

## 2020-10-23 ENCOUNTER — Ambulatory Visit (INDEPENDENT_AMBULATORY_CARE_PROVIDER_SITE_OTHER): Payer: Medicare Other | Admitting: Nurse Practitioner

## 2020-10-23 ENCOUNTER — Other Ambulatory Visit: Payer: Self-pay

## 2020-10-23 VITALS — BP 136/82 | HR 89 | Temp 98.4°F | Ht 67.0 in | Wt 155.0 lb

## 2020-10-23 DIAGNOSIS — E782 Mixed hyperlipidemia: Secondary | ICD-10-CM | POA: Diagnosis not present

## 2020-10-23 DIAGNOSIS — R21 Rash and other nonspecific skin eruption: Secondary | ICD-10-CM

## 2020-10-23 DIAGNOSIS — I10 Essential (primary) hypertension: Secondary | ICD-10-CM | POA: Diagnosis not present

## 2020-10-23 DIAGNOSIS — Z23 Encounter for immunization: Secondary | ICD-10-CM | POA: Diagnosis not present

## 2020-10-23 MED ORDER — SHINGRIX 50 MCG/0.5ML IM SUSR: 0.5000 mL | Freq: Once | INTRAMUSCULAR | 0 refills | Status: AC

## 2020-10-23 NOTE — Progress Notes (Signed)
I,Katawbba Wiggins,acting as a Education administrator for [employer], FNP.,have documented all relevant documentation on the behalf of [name], FNP,as directed by  [name], FNP while in the presence of [name], [place].  This visit occurred during the SARS-CoV-2 public health emergency.  Safety protocols were in place, including screening questions prior to the visit, additional usage of staff PPE, and extensive cleaning of exam room while observing appropriate contact time as indicated for disinfecting solutions.  Subjective:     Patient ID: Tammy [name] , female    DOB: 05/15/1933 , 87 y.o.   MRN: [id number]   Chief Complaint  Patient presents with   Hypertension   Hyperlipidemia    HPI  Patient presents today for a blood pressure and cholesterol check.  She is using a rollator walker for the last 2 years but now using for long distances.   Wt Readings from Last 3 Encounters: 10/23/20 : 155 lb (70.3 kg) 07/08/20 : 154 lb (69.9 kg) 04/03/20 : 154 lb (69.9 kg)    Hypertension This is a chronic problem. The current episode started more than 1 year ago. The problem is unchanged. The problem is controlled. Pertinent negatives include no chest pain, headaches, palpitations or shortness of breath. Risk factors for coronary artery disease include sedentary lifestyle. The current treatment provides no improvement. There are no compliance problems.  There is no history of angina. There is no history of chronic renal disease.  Hyperlipidemia This is a chronic problem. The current episode started more than 1 year ago. The problem is controlled. She has no history of chronic renal disease. Pertinent negatives include no chest pain or shortness of breath. There are no compliance problems.   Fall Pertinent negatives include no headaches.    Past Medical History:  Diagnosis Date   Acid reflux    Glaucoma    High cholesterol    Hypertension    Lower GI bleed    Rectal bleeding 09/26/2015      Family History  Problem Relation Age of Onset   Hypertension Mother    Hypertension Sister    Hypertension Brother    Heart disease Brother      Current Outpatient Medications:    acetaminophen (TYLENOL) 500 MG tablet, Take 500 mg by mouth every 6 (six) hours as needed for mild pain., Disp: , Rfl:    amlodipine-olmesartan (AZOR) 10-20 MG tablet, TAKE 1 TABLET BY MOUTH ONCE DAILY., Disp: 28 tablet, Rfl: 11   bimatoprost (LUMIGAN) 0.03 % ophthalmic solution, Place 1 drop into both eyes at bedtime., Disp: , Rfl:    calcium-vitamin D (OSCAL WITH D) 500-200 MG-UNIT per tablet, Take 1 tablet by mouth daily., Disp: , Rfl:    diclofenac Sodium (VOLTAREN) 1 % GEL, Apply 2 g topically 4 (four) times daily., Disp: 100 g, Rfl: 3   Magnesium 250 MG TABS, Take 1 tablet by mouth with evening meal, Disp: 90 tablet, Rfl: 1   omeprazole (PRILOSEC) 20 MG capsule, TAKE (1) CAPSULE BY MOUTH ONCE DAILY., Disp: 28 capsule, Rfl: 11   Zoster Vaccine Adjuvanted (SHINGRIX) injection, Inject 0.5 mLs into the muscle once for 1 dose., Disp: 0.5 mL, Rfl: 0   hydrOXYzine (ATARAX/VISTARIL) 25 MG tablet, Take 1 tablet (25 mg total) by mouth daily as needed. For agitation (Patient not taking: Reported on 10/23/2020), Disp: 30 tablet, Rfl: 2   Allergies  Allergen Reactions   Advil [Ibuprofen] Itching   Ciprofloxacin Other (See Comments)    REACTION: hives  Zolpidem Tartrate Other (See Comments)    REACTION: trembling   Unisom [Doxylamine] Swelling and Rash     Review of Systems  Constitutional: Negative.   Respiratory: Negative.  Negative for shortness of breath and wheezing.   Cardiovascular: Negative.  Negative for chest pain and palpitations.  Gastrointestinal: Negative.   Neurological:  Negative for dizziness and headaches.  Psychiatric/Behavioral: Negative.    All other systems reviewed and are negative.   Today's Vitals   10/23/20 1447  BP: 136/82  Pulse: 89  Temp: 98.4 F (36.9 C)  Weight: 155 lb  (70.3 kg)  Height: 5' 7" (1.702 m)  PainSc: 0-No pain   Body mass index is 24.28 kg/m.   Objective:  Physical Exam Constitutional:      General: She is not in acute distress.    Appearance: Normal appearance. She is normal weight.  Cardiovascular:     Rate and Rhythm: Normal rate and regular rhythm.     Pulses: Normal pulses.     Heart sounds: Normal heart sounds.  Pulmonary:     Effort: Pulmonary effort is normal.     Breath sounds: Normal breath sounds.  Abdominal:     General: Abdomen is flat. Bowel sounds are normal.     Palpations: Abdomen is soft.  Musculoskeletal:        General: Normal range of motion.     Cervical back: Normal range of motion and neck supple.  Skin:    General: Skin is warm and dry.     Capillary Refill: Capillary refill takes less than 2 seconds.     Comments: Right side of neck has healing rash present  Neurological:     General: No focal deficit present.     Mental Status: She is alert and oriented to person, place, and time.  Psychiatric:        Mood and Affect: Mood normal.        Behavior: Behavior normal.        Thought Content: Thought content normal.        Judgment: Judgment normal.        Assessment And Plan:     1. Essential hypertension Comments: Blood pressure is fairly controlled Continue current medications - BMP8+EGFR  2. Mixed hyperlipidemia Comments: Stable, will check lipid panel - BMP8+EGFR - Lipid panel  3. Rash and nonspecific skin eruption Comments: right side of neck with healing rash, looks like could be remnants of shingles however the patient does not complain of pain. Use Aloe/hydrocortisone  4. Encounter for immunization Sent Rx to pharmacy - Zoster Vaccine Adjuvanted [facility]) injection; Inject 0.5 mLs into the muscle once for 1 dose.  Dispense: 0.5 mL; Refill: 0    Patient was given opportunity to ask questions. Patient verbalized understanding of the plan and was able to repeat key elements of the  plan. All questions were answered to their satisfaction.  [name], FNP   I, [name], FNP, have reviewed all documentation for this visit. The documentation on 10/23/20 for the exam, diagnosis, procedures, and orders are all accurate and complete.   IF YOU HAVE BEEN REFERRED TO A SPECIALIST, IT MAY TAKE 1-2 WEEKS TO SCHEDULE/PROCESS THE REFERRAL. IF YOU HAVE NOT HEARD FROM US/SPECIALIST IN TWO WEEKS, PLEASE GIVE [place] A CALL AT [phone].   THE PATIENT IS ENCOURAGED TO PRACTICE SOCIAL DISTANCING DUE TO THE COVID-19 PANDEMIC.

## 2020-10-23 NOTE — Patient Instructions (Signed)
Cooking With Less Salt Cooking with less salt is one way to reduce the amount of sodium you get from food. Sodium is one of the elements that make up salt. It is found naturally in foods and is also added to certain foods. Depending on your condition and overall health, your health care provider or dietitian may recommend that you reduce your sodium intake. Most people should have less than 2,300 milligrams (mg) of sodium each day. If you have high blood pressure (hypertension), you may need to limit your sodium to 1,500 mg each day. Follow the tipsbelow to help reduce your sodium intake. What are tips for eating less sodium? Reading food labels  Check the food label before buying or using packaged ingredients. Always check the label for the serving size and sodium content. Look for products with no more than 140 mg of sodium in one serving. Check the % Daily Value column to see what percent of the daily recommended amount of sodium is provided in one serving of the product. Foods with 5% or less in this column are considered low in sodium. Foods with 20% or higher are considered high in sodium. Do not choose foods with salt as one of the first three ingredients on the ingredients list. If salt is one of the first three ingredients, it usually means the item is high in sodium.  Shopping Buy sodium-free or low-sodium products. Look for the following words on food labels: Low-sodium. Sodium-free. Reduced-sodium. No salt added. Unsalted. Always check the sodium content even if foods are labeled as low-sodium or no salt added. Buy fresh foods. Cooking Use herbs, seasonings without salt, and spices as substitutes for salt. Use sodium-free baking soda when baking. Grill, braise, or roast foods to add flavor with less salt. Avoid adding salt to pasta, rice, or hot cereals. Drain and rinse canned vegetables, beans, and meat before use. Avoid adding salt when cooking sweets and desserts. Cook with  low-sodium ingredients. What foods are high in sodium? Vegetables Regular canned vegetables (not low-sodium or reduced-sodium). Sauerkraut, pickled vegetables, and relishes. Olives. French fries. Onion rings. Regular canned tomato sauce and paste. Regular tomato and vegetable juice. Frozenvegetables in sauces. Grains Instant hot cereals. Bread stuffing, pancake, and biscuit mixes. Croutons. Seasoned rice or pasta mixes. Noodle soup cups. Boxed or frozen macaroni and cheese. Regular salted crackers. Self-rising flour. Rolls. Bagels. Flourtortillas and wraps. Meats and other proteins Meat or fish that is salted, canned, smoked, cured, spiced, or pickled. This includes bacon, ham, sausages, hot dogs, corned beef, chipped beef, meat loaves, salt pork, jerky, pickled herring, anchovies, regular canned tuna, andsardines. Salted nuts. Dairy Processed cheese and cheese spreads. Cheese curds. Blue cheese. Feta cheese.String cheese. Regular cottage cheese. Buttermilk. Canned milk. The items listed above may not be a complete list of foods high in sodium. Actual amounts of sodium may be different depending on processing. Contact a dietitian for more information. What foods are low in sodium? Fruits Fresh, frozen, or canned fruit with no sauce added. Fruit juice. Vegetables Fresh or frozen vegetables with no sauce added. "No salt added" canned vegetables. "No salt added" tomato sauce and paste. Low-sodium orreduced-sodium tomato and vegetable juice. Grains Noodles, pasta, quinoa, rice. Shredded or puffed wheat or puffed rice. Regular or quick oats (not instant). Low-sodium crackers. Low-sodium bread. Whole-grainbread and whole-grain pasta. Unsalted popcorn. Meats and other proteins Fresh or frozen whole meats, poultry (not injected with sodium), and fish with no sauce added. Unsalted nuts. Dried peas, beans, and   lentils without added salt. Unsalted canned beans. Eggs. Unsalted nut butters. Low-sodium canned  tunaor chicken. Dairy Milk. Soy milk. Yogurt. Low-sodium cheeses, such as Swiss, Monterey Jack, mozzarella, and ricotta. Sherbet or ice cream (keep to  cup per serving).Cream cheese. Fats and oils Unsalted butter or margarine. Other foods Homemade pudding. Sodium-free baking soda and baking powder. Herbs and spices.Low-sodium seasoning mixes. Beverages Coffee and tea. Carbonated beverages. The items listed above may not be a complete list of foods low in sodium. Actual amounts of sodium may be different depending on processing. Contact a dietitian for more information. What are some salt alternatives when cooking? The following are herbs, seasonings, and spices that can be used instead of salt to flavor your food. Herbs should be fresh or dried. Do not choose packaged mixes. Next to the name of the herb, spice, or seasoning aresome examples of foods you can pair it with. Herbs Bay leaves - Soups, meat and vegetable dishes, and spaghetti sauce. Basil - Italian dishes, soups, pasta, and fish dishes. Cilantro - Meat, poultry, and vegetable dishes. Chili powder - Marinades and Mexican dishes. Chives - Salad dressings and potato dishes. Cumin - Mexican dishes, couscous, and meat dishes. Dill - Fish dishes, sauces, and salads. Fennel - Meat and vegetable dishes, breads, and cookies. Garlic (do not use garlic salt) - Italian dishes, meat dishes, salad dressings, and sauces. Marjoram - Soups, potato dishes, and meat dishes. Oregano - Pizza and spaghetti sauce. Parsley - Salads, soups, pasta, and meat dishes. Rosemary - Italian dishes, salad dressings, soups, and red meats. Saffron - Fish dishes, pasta, and some poultry dishes. Sage - Stuffings and sauces. Tarragon - Fish and poultry dishes. Thyme - Stuffing, meat, and fish dishes. Seasonings Lemon juice - Fish dishes, poultry dishes, vegetables, and salads. Vinegar - Salad dressings, vegetables, and fish dishes. Spices Cinnamon - Sweet  dishes, such as cakes, cookies, and puddings. Cloves - Gingerbread, puddings, and marinades for meats. Curry - Vegetable dishes, fish and poultry dishes, and stir-fry dishes. Ginger - Vegetable dishes, fish dishes, and stir-fry dishes. Nutmeg - Pasta, vegetables, poultry, fish dishes, and custard. Summary Cooking with less salt is one way to reduce the amount of sodium that you get from food. Buy sodium-free or low-sodium products. Check the food label before using or buying packaged ingredients. Use herbs, seasonings without salt, and spices as substitutes for salt in foods. This information is not intended to replace advice given to you by your health care provider. Make sure you discuss any questions you have with your healthcare provider. Document Revised: 01/03/2019 Document Reviewed: 01/03/2019 Elsevier Patient Education  2022 Elsevier Inc.  

## 2020-10-24 DIAGNOSIS — G301 Alzheimer's disease with late onset: Secondary | ICD-10-CM

## 2020-10-24 DIAGNOSIS — F0280 Dementia in other diseases classified elsewhere without behavioral disturbance: Secondary | ICD-10-CM | POA: Diagnosis not present

## 2020-10-24 DIAGNOSIS — E782 Mixed hyperlipidemia: Secondary | ICD-10-CM | POA: Diagnosis not present

## 2020-10-24 DIAGNOSIS — I10 Essential (primary) hypertension: Secondary | ICD-10-CM

## 2020-10-24 LAB — BMP8+EGFR
BUN/Creatinine Ratio: 16 (ref 12–28)
BUN: 14 mg/dL (ref 8–27)
CO2: 24 mmol/L (ref 20–29)
Calcium: 9.8 mg/dL (ref 8.7–10.3)
Chloride: 107 mmol/L — ABNORMAL HIGH (ref 96–106)
Creatinine, Ser: 0.87 mg/dL (ref 0.57–1.00)
Glucose: 94 mg/dL (ref 70–99)
Potassium: 4.5 mmol/L (ref 3.5–5.2)
Sodium: 144 mmol/L (ref 134–144)
eGFR: 65 mL/min/{1.73_m2} (ref >59)

## 2020-10-24 LAB — LIPID PANEL
Chol/HDL Ratio: 3.4 ratio (ref 0.0–4.4)
Cholesterol, Total: 208 mg/dL — ABNORMAL HIGH (ref 100–199)
HDL: 62 mg/dL (ref >39)
LDL Chol Calc (NIH): 133 mg/dL — ABNORMAL HIGH (ref 0–99)
Triglycerides: 73 mg/dL (ref 0–149)
VLDL Cholesterol Cal: 13 mg/dL (ref 5–40)

## 2020-12-02 ENCOUNTER — Telehealth: Payer: Medicare Other

## 2020-12-02 ENCOUNTER — Ambulatory Visit (INDEPENDENT_AMBULATORY_CARE_PROVIDER_SITE_OTHER): Payer: Medicare Other

## 2020-12-02 DIAGNOSIS — F0280 Dementia in other diseases classified elsewhere without behavioral disturbance: Secondary | ICD-10-CM

## 2020-12-02 DIAGNOSIS — E782 Mixed hyperlipidemia: Secondary | ICD-10-CM

## 2020-12-02 DIAGNOSIS — I10 Essential (primary) hypertension: Secondary | ICD-10-CM

## 2020-12-02 DIAGNOSIS — I2510 Atherosclerotic heart disease of native coronary artery without angina pectoris: Secondary | ICD-10-CM

## 2020-12-03 ENCOUNTER — Ambulatory Visit: Payer: Medicare Other

## 2020-12-03 ENCOUNTER — Other Ambulatory Visit: Payer: Self-pay | Admitting: Nurse Practitioner

## 2020-12-03 DIAGNOSIS — E782 Mixed hyperlipidemia: Secondary | ICD-10-CM

## 2020-12-03 DIAGNOSIS — F0280 Dementia in other diseases classified elsewhere without behavioral disturbance: Secondary | ICD-10-CM

## 2020-12-03 DIAGNOSIS — I10 Essential (primary) hypertension: Secondary | ICD-10-CM

## 2020-12-03 DIAGNOSIS — Z9181 History of falling: Secondary | ICD-10-CM

## 2020-12-03 NOTE — Chronic Care Management (AMB) (Signed)
Chronic Care Management    Social Work Note  12/03/2020 Name: Tammy [name] MRN: [id number] DOB: 1933-10-19  Tammy [name] is a 87 y.o. year old female who is a primary care patient of Arnette Felts, FNP. The CCM team was consulted to assist the patient with chronic disease management and/or care coordination needs related to:  HTN, Mixed Hyperlipidemia, Alzheimer's Disease .   Engaged with patients niece and POA [name]  for follow up visit in response to provider referral for social work chronic care management and care coordination services.   Consent to Services:  The patient was given information about Chronic Care Management services, agreed to services, and gave verbal consent prior to initiation of services.  Please see initial visit note for detailed documentation.   Patient agreed to services and consent obtained.   Assessment: Review of patient past medical history, allergies, medications, and health status, including review of relevant consultants reports was performed today as part of a comprehensive evaluation and provision of chronic care management and care coordination services.     SDOH (Social Determinants of Health) assessments and interventions performed:    Advanced Directives Status: Not addressed in this encounter.  CCM Care Plan  Allergies  Allergen Reactions   Advil [Ibuprofen] Itching   Ciprofloxacin Other (See Comments)    REACTION: hives   Zolpidem Tartrate Other (See Comments)    REACTION: trembling   Unisom [Doxylamine] Swelling and Rash    Outpatient Encounter Medications as of 12/03/2020  Medication Sig   acetaminophen (TYLENOL) 500 MG tablet Take 500 mg by mouth every 6 (six) hours as needed for mild pain.   amlodipine-olmesartan (AZOR) 10-20 MG tablet TAKE 1 TABLET BY MOUTH ONCE DAILY.   bimatoprost (LUMIGAN) 0.03 % ophthalmic solution Place 1 drop into both eyes at bedtime.   calcium-vitamin D (OSCAL WITH D) 500-200 MG-UNIT per tablet  Take 1 tablet by mouth daily.   diclofenac Sodium (VOLTAREN) 1 % GEL Apply 2 g topically 4 (four) times daily.   hydrOXYzine (ATARAX/VISTARIL) 25 MG tablet Take 1 tablet (25 mg total) by mouth daily as needed. For agitation (Patient not taking: Reported on 10/23/2020)   Magnesium 250 MG TABS Take 1 tablet by mouth with evening meal   omeprazole (PRILOSEC) 20 MG capsule TAKE (1) CAPSULE BY MOUTH ONCE DAILY.   No facility-administered encounter medications on file as of 12/03/2020.    Patient Active Problem List   Diagnosis Date Noted   Ankle fracture, bimalleolar, closed 04/03/2020   Unilateral primary osteoarthritis, right knee 05/29/2019   Gait disturbance 07/04/2018   Right knee pain 07/04/2018   Alzheimer disease (HCC) 07/04/2018   Iron deficiency 11/23/2017   Mixed hyperlipidemia 11/23/2017   Memory loss 11/23/2017   History of falling 11/23/2017   Leukocytosis 10/17/2016   Diverticulosis of large intestine with hemorrhage    Essential hypertension 09/26/2015   Hypokalemia 09/26/2015   HIP REPLACEMENT, RIGHT, HX OF 05/27/2008   VITAMIN D DEFICIENCY 05/13/2008   GERD 05/13/2008   OSTEOARTHRITIS, HIPS, BILATERAL 05/13/2008    Conditions to be addressed/monitored:  HTN, Mixed Hyperlipidemia, Alzheimer's Disease ; ADL IADL limitations and Limited access to caregiver  Care Plan : Social Work Care Plan  Updates made by [name] since 12/03/2020 12:00 AM     Problem: Home and Family Safety (Wellness)      Goal: Home and Family Safety Maintained   Start Date: 12/03/2020  Priority: High  Note:   Current Barriers:  Chronic disease  management support and education needs related to  HTN, Mixed Hyperlipidemia, Alzheimer's Disease   Housing barriers - in a home that is difficult to navigate Limited access to caregiver  Social Worker Clinical Goal(s):  patient will work with SW to identify and address any acute and/or chronic care coordination needs related to the self health  management of  HTN, Mixed Hyperlipidemia, and Alzheimer's Disease    SW Interventions:  Inter-disciplinary care team collaboration (see longitudinal plan of care) Collaboration with Arnette Felts, FNP regarding development and update of comprehensive plan of care as evidenced by provider attestation and co-signature Successful outbound call placed to the patients niece and caregiver [name] who reports the patient is no longer staying with her sister Florentina Addison due to concerns with family dysfunction. The patient is now living with her niece [name] Discussed the patient moved in less than 24 hours ago and [name] is concerned with patients ability to remain in her home due to concerns including steps in/out of the home, narrow doorways which limit ability to ambulate with rollator, [name]'s time out of the home due to work, and heating source (wood burning fireplace) Reviewed options including placement, adult day programs, or a caregiver in the home - [name] is not wanting placement at this time as she is concerned the patient may decline faster. She is open to adult day programs but due to work schedule is concerned the patient would have to get up prior to 5:00 am in order to get ready [name] works in [place] and must leave the home over an hour prior to shift) Determined [name] has spoken with [employer] who plans to visit the home on 11/29 to do an in home assessment prior to beginning home care - this service is only able to offer an aid 2 days/week for 1 hour per day Discussed patient is unable to privately hire a caregiver to be in the home while [name] is at work - [name] reports she may have to quite working in order to care for the patient Assessed for interest to move homes to a layout that is easier for the patient to navigate- this is not an option due to current home is a family home that is paid for [employer] on opportunity to participate with home health PT for a home safety evaluation  to determine if modifications are an option to make the home safer for the patient - [name] agreeable [name] also requests a referral to a podiatrist for a nail trim - states the patient needs nail care and her nails are sensitive Discussed plans for SW to request orders and follow up over the next week  Collaboration with Arnette Felts FNP to request orders for a home safety evaluation as well as a podiatrist per the request of patients niece Collaboration with Delsa Sale RN Care Manager to discuss interventions and plan  Patient Goals/Self-Care Activities patient will: with the help of her niece [name]  -  Engage with home health to complete a home safety evaluation -Contact SW as needed prior to next scheduled call  Follow Up Plan:  SW will follow up with [name] over the next week       Follow Up Plan: SW will follow up with patient by phone over the next week      [name], Kenard Gower, CDP Social Worker, Certified Dementia Practitioner [facility] Care Management [phone]

## 2020-12-03 NOTE — Patient Instructions (Signed)
Social Worker Visit Information  Goals we discussed today:   Goals Addressed             This Visit's Progress    Home and Family Safety Maintained       Timeframe:  Short-Term Goal Priority:  High Start Date:  11.9.22                                              Next planned outreach: 11.15.22  Patient Goals/Self-Care Activities patient will: with the help of her niece [name]  - Engage with home health to complete a home safety evaluation -Contact SW as needed prior to next scheduled call         Materials Provided: Verbal education about level of care options provided by phone  Patient verbalizes understanding of instructions provided today and agrees to view in MyChart.   Follow Up Plan: SW will follow up with patient by phone over the next week   [name], BSW, CDP Social Worker, Certified Dementia Practitioner [facility] Care Management [phone]

## 2020-12-03 NOTE — Patient Instructions (Addendum)
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)   Start Date: 12/02/2020  Expected End Date: 12/02/2021  This Visit's Progress: On track  Priority: High  Note:   Current Barriers:  Knowledge Deficits related to plan of care for management of Essential hypertension, Mixed hyperlipidemia, Atherosclerotic cardiovascular disease, 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 Chronic Disease Management support and education needs related to Essential hypertension, Mixed hyperlipidemia, Atherosclerotic cardiovascular disease, 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 Cognitive Deficits  RNCM Clinical Goal(s):  Patient will verbalize basic understanding of  Essential hypertension, Mixed hyperlipidemia, Atherosclerotic cardiovascular disease, 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 disease process and self health management plan   take all medications exactly as prescribed and will call provider for medication related questions demonstrate Improved health management independence   continue to work with RN Care Manager to address care management and care coordination needs related to  Essential hypertension, Mixed hyperlipidemia, Atherosclerotic cardiovascular disease, 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   Interventions: 1:1 collaboration with primary care provider regarding development and update of comprehensive plan of care as evidenced by provider attestation and co-signature Inter-disciplinary care team collaboration (see longitudinal plan of care) Evaluation of current treatment plan related to  self management and patient's adherence to plan as established by provider  New goal. Evaluation of current treatment plan related to Dementia, self-management and patient's adherence to plan as established by provider. Determined niece Tammy [name] is moving Tammy [name] into her home to continue caring for her due to family dysfunction in her aunt's home Discussed plans with patient for ongoing care management follow up and provided patient with direct contact information for care management team Provided education to patient re: potential triggers that may worsen dementia including family stressors; Social Work referral for assistance with resources for caregiver/custodial care and other pertinent community resources; Discussed plans with patient for ongoing care management follow up and provided patient with direct contact information for care management team; Assessed social determinant of health barriers;   Patient Goals/Self-Care Activities: Patient will attend all scheduled provider appointments Patient will attend church or other social activities Patient will continue to perform ADL's independently Patient will continue to perform IADL's independently Patient will work with BSW to address care coordination needs and will continue to work with the clinical  team to address health care and disease management related needs.    Follow Up Plan:  Telephone follow up appointment with care management team member scheduled for:  12/03/20      Consent to CCM Services: Tammy [name] was given information about Chronic Care Management services including:  CCM service includes personalized support from designated clinical staff supervised by her  physician, including individualized plan of care and coordination with other care providers 24/7 contact phone numbers for assistance for urgent and routine care needs. Service will only be billed when office clinical staff spend 20 minutes or more in a month to coordinate care. Only one practitioner may furnish and bill the service in a calendar month. The patient may stop CCM services at any time (effective at the end of the month) by phone call to the office staff. The patient will be responsible for cost sharing (co-pay) of up to 20% of the service fee (after annual deductible is met).  Patient agreed to services and verbal consent obtained.   The patient verbalized understanding of instructions, educational materials, and care plan provided today and declined offer to receive copy of patient instructions, educational materials, and care plan.   Telephone follow up appointment with care management team member scheduled for: 12/03/20  Barb Merino, RN, BSN, CCM Care Management Coordinator [place] [facility]  Direct Phone: [phone]

## 2020-12-03 NOTE — Chronic Care Management (AMB) (Signed)
Chronic Care Management   CCM RN Visit Note  12/02/2020 Name: Tammy [name] MRN: [id number] DOB: [birth year]  Subjective: Tammy [name] is a 87 y.o. year old female who is a primary care patient of Tammy Felts, FNP. The care management team was consulted for assistance with disease management and care coordination needs.    Engaged with patient by telephone for follow up visit in response to provider referral for case management and/or care coordination services.   Consent to Services:  The patient was given information about Chronic Care Management services, agreed to services, and gave verbal consent prior to initiation of services.  Please see initial visit note for detailed documentation.   Patient agreed to services and verbal consent obtained.   Assessment: Review of patient past medical history, allergies, medications, health status, including review of consultants reports, laboratory and other test data, was performed as part of comprehensive evaluation and provision of chronic care management services.   SDOH (Social Determinants of Health) assessments and interventions performed:  yes, see care plan   CCM Care Plan  Allergies  Allergen Reactions   Advil [Ibuprofen] Itching   Ciprofloxacin Other (See Comments)    REACTION: hives   Zolpidem Tartrate Other (See Comments)    REACTION: trembling   Unisom [Doxylamine] Swelling and Rash    Outpatient Encounter Medications as of 12/02/2020  Medication Sig   acetaminophen (TYLENOL) 500 MG tablet Take 500 mg by mouth every 6 (six) hours as needed for mild pain.   amlodipine-olmesartan (AZOR) 10-20 MG tablet TAKE 1 TABLET BY MOUTH ONCE DAILY.   bimatoprost (LUMIGAN) 0.03 % ophthalmic solution Place 1 drop into both eyes at bedtime.   calcium-vitamin D (OSCAL WITH D) 500-200 MG-UNIT per tablet Take 1 tablet by mouth daily.   diclofenac Sodium (VOLTAREN) 1 % GEL Apply 2 g topically 4 (four) times daily.   hydrOXYzine  (ATARAX/VISTARIL) 25 MG tablet Take 1 tablet (25 mg total) by mouth daily as needed. For agitation (Patient not taking: Reported on 10/23/2020)   Magnesium 250 MG TABS Take 1 tablet by mouth with evening meal   omeprazole (PRILOSEC) 20 MG capsule TAKE (1) CAPSULE BY MOUTH ONCE DAILY.   No facility-administered encounter medications on file as of 12/02/2020.    Patient Active Problem List   Diagnosis Date Noted   Ankle fracture, bimalleolar, closed 04/03/2020   Unilateral primary osteoarthritis, right knee 05/29/2019   Gait disturbance 07/04/2018   Right knee pain 07/04/2018   Alzheimer disease (HCC) 07/04/2018   Iron deficiency 11/23/2017   Mixed hyperlipidemia 11/23/2017   Memory loss 11/23/2017   History of falling 11/23/2017   Leukocytosis 10/17/2016   Diverticulosis of large intestine with hemorrhage    Essential hypertension 09/26/2015   Hypokalemia 09/26/2015   HIP REPLACEMENT, RIGHT, HX OF 05/27/2008   VITAMIN D DEFICIENCY 05/13/2008   GERD 05/13/2008   OSTEOARTHRITIS, HIPS, BILATERAL 05/13/2008    Conditions to be addressed/monitored: Essential hypertension, Mixed hyperlipidemia, Atherosclerotic cardiovascular disease, 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  Care Plan : RN Care Manager Plan of Care  Updates made by Tammy Churches, RN since 12/02/2020 12:00 AM     Problem: No plan established for management of chronic disease states (Essential hypertension, Mixed hyperlipidemia, Atherosclerotic cardiovascular disease, 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)   Priority: High     Long-Range Goal: Establishment of plan of care for management of chronic diseases (Essential hypertension, Mixed hyperlipidemia, Atherosclerotic cardiovascular disease, Late onset Alzheimers disease  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)   Start Date: 12/02/2020  Expected End Date: 12/02/2021  This Visit's Progress: On track  Priority: High  Note:   Current  Barriers:  Knowledge Deficits related to plan of care for management of Essential hypertension, Mixed hyperlipidemia, Atherosclerotic cardiovascular disease, 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 Chronic Disease Management support and education needs related to Essential hypertension, Mixed hyperlipidemia, Atherosclerotic cardiovascular disease, 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 Cognitive Deficits  RNCM Clinical Goal(s):  Patient will verbalize basic understanding of  Essential hypertension, Mixed hyperlipidemia, Atherosclerotic cardiovascular disease, 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 disease process and self health management plan   take all medications exactly as prescribed and will call provider for medication related questions demonstrate Improved health management independence   continue to work with RN Care Manager to address care management and care coordination needs related to  Essential hypertension, Mixed hyperlipidemia, Atherosclerotic cardiovascular disease, 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 work with Child psychotherapist to address  related to the management of Cognitive Deficits, Memory Deficits, and Lacks knowledge of community resource: respite/custodial care related to the management of Essential hypertension, Mixed hyperlipidemia, Atherosclerotic cardiovascular disease, 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   Interventions: 1:1 collaboration with primary care provider regarding development and update of comprehensive plan of care as evidenced by provider attestation and co-signature Inter-disciplinary care team collaboration (see longitudinal plan of care) Evaluation of current treatment plan related to  self management and patient's adherence to plan as established by provider  New goal. Spoke to  niece Tammy [name]  Evaluation of current treatment plan related to Dementia, self-management and patient's adherence to plan as established by provider. Determined niece Tammy [name] is moving Tammy [name] into her home to continue caring for her due to family dysfunction in her aunt's home Discussed plans with patient for ongoing care management follow up and provided patient with direct contact information for care management team Provided education to patient re: potential triggers that may worsen dementia including family stressors; Social Work referral for assistance with resources for caregiver/custodial care and other pertinent community resources; Discussed plans with patient for ongoing care management follow up and provided patient with direct contact information for care management team; Assessed social determinant of health barriers;   Patient Goals/Self-Care Activities: Patient will attend all scheduled provider appointments Patient will attend church or other social activities Patient will continue to perform ADL's independently Patient will continue to perform IADL's independently Patient will work with BSW to address care coordination needs and will continue to work with the clinical team to address health care and disease management related needs.    Follow Up Plan:  Telephone follow up appointment with care management team member scheduled for:  12/03/20      Plan:Telephone follow up appointment with care management team member scheduled for:  12/03/20  Delsa Sale, RN, BSN, CCM Care Management Coordinator [facility] Care Management/[facility]  Direct Phone: [phone]

## 2020-12-07 DIAGNOSIS — G3184 Mild cognitive impairment, so stated: Secondary | ICD-10-CM | POA: Diagnosis not present

## 2020-12-07 DIAGNOSIS — E782 Mixed hyperlipidemia: Secondary | ICD-10-CM | POA: Diagnosis not present

## 2020-12-07 DIAGNOSIS — K219 Gastro-esophageal reflux disease without esophagitis: Secondary | ICD-10-CM | POA: Diagnosis not present

## 2020-12-07 DIAGNOSIS — I10 Essential (primary) hypertension: Secondary | ICD-10-CM | POA: Diagnosis not present

## 2020-12-07 DIAGNOSIS — H409 Unspecified glaucoma: Secondary | ICD-10-CM | POA: Diagnosis not present

## 2020-12-09 ENCOUNTER — Telehealth: Payer: Self-pay

## 2020-12-09 ENCOUNTER — Ambulatory Visit: Payer: Medicare Other

## 2020-12-09 DIAGNOSIS — F0280 Dementia in other diseases classified elsewhere without behavioral disturbance: Secondary | ICD-10-CM

## 2020-12-09 DIAGNOSIS — I10 Essential (primary) hypertension: Secondary | ICD-10-CM

## 2020-12-09 DIAGNOSIS — E782 Mixed hyperlipidemia: Secondary | ICD-10-CM

## 2020-12-09 NOTE — Chronic Care Management (AMB) (Signed)
Chronic Care Management    Social Work Note  12/09/2020 Name: Tammy [name] MRN: [id number] DOB: 02-Dec-1933  Tammy [name] is a 87 y.o. year old female who is a primary care patient of Tammy Felts, FNP. The CCM team was consulted to assist the patient with chronic disease management and/or care coordination needs related to:  HTN, Mixed Hyperlipidemia, and Alzheimer's Disease .   Engaged with patients POA Tammy [name]  for follow up visit in response to provider referral for social work chronic care management and care coordination services.   Consent to Services:  The patient was given information about Chronic Care Management services, agreed to services, and gave verbal consent prior to initiation of services.  Please see initial visit note for detailed documentation.   Patient agreed to services and consent obtained.   Assessment: Review of patient past medical history, allergies, medications, and health status, including review of relevant consultants reports was performed today as part of a comprehensive evaluation and provision of chronic care management and care coordination services.     SDOH (Social Determinants of Health) assessments and interventions performed:    Advanced Directives Status: Not addressed in this encounter.  CCM Care Plan  Allergies  Allergen Reactions   Advil [Ibuprofen] Itching   Ciprofloxacin Other (See Comments)    REACTION: hives   Zolpidem Tartrate Other (See Comments)    REACTION: trembling   Unisom [Doxylamine] Swelling and Rash    Outpatient Encounter Medications as of 12/09/2020  Medication Sig   acetaminophen (TYLENOL) 500 MG tablet Take 500 mg by mouth every 6 (six) hours as needed for mild pain.   amlodipine-olmesartan (AZOR) 10-20 MG tablet TAKE 1 TABLET BY MOUTH ONCE DAILY.   bimatoprost (LUMIGAN) 0.03 % ophthalmic solution Place 1 drop into both eyes at bedtime.   calcium-vitamin D (OSCAL WITH D) 500-200 MG-UNIT per tablet  Take 1 tablet by mouth daily.   diclofenac Sodium (VOLTAREN) 1 % GEL Apply 2 g topically 4 (four) times daily.   hydrOXYzine (ATARAX/VISTARIL) 25 MG tablet Take 1 tablet (25 mg total) by mouth daily as needed. For agitation (Patient not taking: Reported on 10/23/2020)   Magnesium 250 MG TABS Take 1 tablet by mouth with evening meal   omeprazole (PRILOSEC) 20 MG capsule TAKE (1) CAPSULE BY MOUTH ONCE DAILY.   No facility-administered encounter medications on file as of 12/09/2020.    Patient Active Problem List   Diagnosis Date Noted   Ankle fracture, bimalleolar, closed 04/03/2020   Unilateral primary osteoarthritis, right knee 05/29/2019   Gait disturbance 07/04/2018   Right knee pain 07/04/2018   Alzheimer disease (HCC) 07/04/2018   Iron deficiency 11/23/2017   Mixed hyperlipidemia 11/23/2017   Memory loss 11/23/2017   History of falling 11/23/2017   Leukocytosis 10/17/2016   Diverticulosis of large intestine with hemorrhage    Essential hypertension 09/26/2015   Hypokalemia 09/26/2015   HIP REPLACEMENT, RIGHT, HX OF 05/27/2008   VITAMIN D DEFICIENCY 05/13/2008   GERD 05/13/2008   OSTEOARTHRITIS, HIPS, BILATERAL 05/13/2008    Conditions to be addressed/monitored:  HTN, Mixed Hyperlipidemia, Alzheimer's Disease ; Limited access to caregiver  Care Plan : Social Work Care Plan  Updates made by [name] since 12/09/2020 12:00 AM     Problem: Home and Family Safety (Wellness)      Goal: Home and Family Safety Maintained   Start Date: 12/03/2020  This Visit's Progress: On track  Priority: High  Note:   Current Barriers:  Chronic  disease management support and education needs related to  HTN, Mixed Hyperlipidemia, Alzheimer's Disease   Housing barriers - in a home that is difficult to navigate Limited access to caregiver  Social Worker Clinical Goal(s):  patient will work with SW to identify and address any acute and/or chronic care coordination needs related to the  self health management of  HTN, Mixed Hyperlipidemia, and Alzheimer's Disease    SW Interventions:  Inter-disciplinary care team collaboration (see longitudinal plan of care) Collaboration with Tammy Felts, FNP regarding development and update of comprehensive plan of care as evidenced by provider attestation and co-signature Telephonic visit completed with patients niece and POA Tammy [name] Confirmed home health PT has completed intake assessment visit and plans to visit the patient 2 times a week Discussed home health PT is also ordering home health SW to assist with patient care coordination needs Encouraged Tammy [name] to work closely with the home health SW as she may be knowledgeable of resources available to the patient that this SW is not aware of Determined the patients family has obtained a bedside commode for her to use at night to alleviate concerns she may fall walking to the bathroom due to narrow doorways that do not allow her walker to fit into the bathroom Discussed [name]'s mom and dad are assisting with daily care needs by staying with the patient while Tammy [name] works so the patient is not alone Determined the family is unable to privately hire a caregiver and may need to place the patient if in home resources are not identified by the home health SW Tammy [name] SW would follow up with her on 11/30 to assess for care coordination needs - encouraged her to contact SW sooner as needed Collaboration with Tammy Felts FNP and Tammy Sale RN Care Manager to advise of interventions and plan  Patient Goals/Self-Care Activities patient will: with the help of her niece Tammy [name]  -  Engage with home health SW to identify resources available to the patient -Contact SW as needed prior to next scheduled call  Follow Up Plan:  SW will follow up with Tammy [name] on 11.30.22       Follow Up Plan: SW will follow up with patient by phone over the next 21 days      [name], Kenard Gower,  CDP Social Worker, Certified Dementia Practitioner [facility] Care Management [phone]

## 2020-12-09 NOTE — Patient Instructions (Signed)
Social Worker Visit Information  Goals we discussed today:   Goals Addressed             This Visit's Progress    Home and Family Safety Maintained   On track    Timeframe:  Short-Term Goal Priority:  High Start Date:  11.9.22                                              Next planned outreach: 11.30.22  Patient Goals/Self-Care Activities patient will: with the help of her niece [name] -Engage with home health SW to identify resources available to the patient -Contact SW as needed prior to next scheduled call         Materials Provided: Verbal education about home health services provided by phone  Patient verbalizes understanding of instructions provided today and agrees to view in [place].   Follow Up Plan: SW will follow up with patient by phone over the next 21 days   [name], BSW, CDP Social Worker, Certified Dementia Practitioner [facility] Care Management [phone]

## 2020-12-09 NOTE — Telephone Encounter (Signed)
  Care Management   Follow Up Note   12/09/2020 Name: Tammy [name] MRN: [id number] DOB: 06-21-1933   Referred by: Arnette Felts, FNP Reason for referral : Chronic Care Management (Unsuccessful call)   An unsuccessful telephone outreach was attempted today. The patient was referred to the case management team for assistance with care management and care coordination. SW left a HIPAA compliant voice message requesting a return call.  Follow Up Plan: The care management team will reach out to the patient again over the next 21 days.   [name], BSW, CDP Social Worker, Certified Dementia Practitioner [facility] Care Management [phone]

## 2020-12-11 DIAGNOSIS — H409 Unspecified glaucoma: Secondary | ICD-10-CM | POA: Diagnosis not present

## 2020-12-11 DIAGNOSIS — E782 Mixed hyperlipidemia: Secondary | ICD-10-CM | POA: Diagnosis not present

## 2020-12-11 DIAGNOSIS — G3184 Mild cognitive impairment, so stated: Secondary | ICD-10-CM | POA: Diagnosis not present

## 2020-12-11 DIAGNOSIS — I10 Essential (primary) hypertension: Secondary | ICD-10-CM | POA: Diagnosis not present

## 2020-12-11 DIAGNOSIS — K219 Gastro-esophageal reflux disease without esophagitis: Secondary | ICD-10-CM | POA: Diagnosis not present

## 2020-12-17 ENCOUNTER — Other Ambulatory Visit: Payer: Self-pay | Admitting: Nurse Practitioner

## 2020-12-17 DIAGNOSIS — E782 Mixed hyperlipidemia: Secondary | ICD-10-CM | POA: Diagnosis not present

## 2020-12-17 DIAGNOSIS — G3184 Mild cognitive impairment, so stated: Secondary | ICD-10-CM | POA: Diagnosis not present

## 2020-12-17 DIAGNOSIS — I10 Essential (primary) hypertension: Secondary | ICD-10-CM | POA: Diagnosis not present

## 2020-12-17 DIAGNOSIS — H409 Unspecified glaucoma: Secondary | ICD-10-CM | POA: Diagnosis not present

## 2020-12-17 DIAGNOSIS — K219 Gastro-esophageal reflux disease without esophagitis: Secondary | ICD-10-CM | POA: Diagnosis not present

## 2020-12-22 DIAGNOSIS — K219 Gastro-esophageal reflux disease without esophagitis: Secondary | ICD-10-CM | POA: Diagnosis not present

## 2020-12-22 DIAGNOSIS — I10 Essential (primary) hypertension: Secondary | ICD-10-CM | POA: Diagnosis not present

## 2020-12-22 DIAGNOSIS — H409 Unspecified glaucoma: Secondary | ICD-10-CM | POA: Diagnosis not present

## 2020-12-22 DIAGNOSIS — G3184 Mild cognitive impairment, so stated: Secondary | ICD-10-CM | POA: Diagnosis not present

## 2020-12-22 DIAGNOSIS — E782 Mixed hyperlipidemia: Secondary | ICD-10-CM | POA: Diagnosis not present

## 2020-12-24 ENCOUNTER — Telehealth: Payer: Medicare Other

## 2020-12-24 ENCOUNTER — Telehealth: Payer: Self-pay

## 2020-12-24 DIAGNOSIS — F0280 Dementia in other diseases classified elsewhere without behavioral disturbance: Secondary | ICD-10-CM

## 2020-12-24 DIAGNOSIS — G301 Alzheimer's disease with late onset: Secondary | ICD-10-CM

## 2020-12-24 DIAGNOSIS — E782 Mixed hyperlipidemia: Secondary | ICD-10-CM

## 2020-12-24 DIAGNOSIS — I10 Essential (primary) hypertension: Secondary | ICD-10-CM

## 2020-12-24 DIAGNOSIS — I2510 Atherosclerotic heart disease of native coronary artery without angina pectoris: Secondary | ICD-10-CM

## 2020-12-24 NOTE — Telephone Encounter (Signed)
  Care Management   Follow Up Note   12/24/2020 Name: Tammy [name] MRN: [id number] DOB: 23-Dec-1933   Referred by: Arnette Felts, FNP Reason for referral : Chronic Care Management (Unsuccessful call)   An unsuccessful telephone outreach was attempted today. The patient was referred to the case management team for assistance with care management and care coordination.   Follow Up Plan: A HIPPA compliant phone message was left for the patient providing contact information and requesting a return call. SW will follow up with the patient over the next 14 days.  [name], BSW, CDP Social Worker, Certified Dementia Practitioner [facility] Care Management [phone]

## 2020-12-27 ENCOUNTER — Encounter: Payer: Self-pay | Admitting: Nurse Practitioner

## 2020-12-31 ENCOUNTER — Telehealth: Payer: Medicare Other

## 2020-12-31 ENCOUNTER — Ambulatory Visit: Payer: Self-pay

## 2020-12-31 DIAGNOSIS — I2510 Atherosclerotic heart disease of native coronary artery without angina pectoris: Secondary | ICD-10-CM

## 2020-12-31 DIAGNOSIS — G3184 Mild cognitive impairment, so stated: Secondary | ICD-10-CM | POA: Diagnosis not present

## 2020-12-31 DIAGNOSIS — E782 Mixed hyperlipidemia: Secondary | ICD-10-CM

## 2020-12-31 DIAGNOSIS — F0280 Dementia in other diseases classified elsewhere without behavioral disturbance: Secondary | ICD-10-CM

## 2020-12-31 DIAGNOSIS — I10 Essential (primary) hypertension: Secondary | ICD-10-CM | POA: Diagnosis not present

## 2020-12-31 DIAGNOSIS — H409 Unspecified glaucoma: Secondary | ICD-10-CM | POA: Diagnosis not present

## 2020-12-31 DIAGNOSIS — K219 Gastro-esophageal reflux disease without esophagitis: Secondary | ICD-10-CM | POA: Diagnosis not present

## 2021-01-01 NOTE — Patient Instructions (Addendum)
Visit Information   Thank you for taking time to visit with me today. Please don't hesitate to contact me if I can be of assistance to you before our next scheduled telephone appointment.  Following are the goals we discussed today:  (Copy and paste patient goals from clinical care plan here)  Our next appointment is by telephone on 03/03/21 at 12noon  Please call the care guide team at [phone] if you need to cancel or reschedule your appointment.   If you are experiencing a Mental Health or [place] or need someone to talk to, please call 1-800-273-TALK (toll free, 24 hour hotline)   Following is a copy of your full care plan:  Care Plan : [place] of Care  Updates made by Lynne Logan, RN since 12/31/2020 12:00 AM     Problem: No plan established for management of chronic disease states (Essential hypertension, Mixed hyperlipidemia, Atherosclerotic cardiovascular disease, 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)   Priority: High     Long-Range Goal: Establishment of plan of care for management of chronic diseases (Essential hypertension, Mixed hyperlipidemia, Atherosclerotic cardiovascular disease, 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)   Start Date: 12/02/2020  Expected End Date: 12/02/2021  Recent Progress: On track  Priority: High  Note:   Current Barriers:  Knowledge Deficits related to plan of care for management of Essential hypertension, Mixed hyperlipidemia, Atherosclerotic cardiovascular disease, 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 Chronic Disease Management support and education needs related to Essential hypertension, Mixed hyperlipidemia, Atherosclerotic cardiovascular disease, 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 Cognitive Deficits  RNCM Clinical Goal(s):  Patient will verbalize basic understanding of  Essential hypertension, Mixed hyperlipidemia,  Atherosclerotic cardiovascular disease, 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 disease process and self health management plan   take all medications exactly as prescribed and will call provider for medication related questions demonstrate Improved health management independence   continue to work with RN Care Manager to address care management and care coordination needs related to  Essential hypertension, Mixed hyperlipidemia, Atherosclerotic cardiovascular disease, 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   Interventions: 1:1 collaboration with primary care provider regarding development and update of comprehensive plan of care as evidenced by provider attestation and co-signature Inter-disciplinary care team collaboration (see longitudinal plan of care) Evaluation of current treatment plan related to  self management and patient's adherence to plan as established by provider  Dementia: (Status: New goal.) Completed successful outbound call with niece [name] Evaluation of current treatment plan related to Dementia, self-management and patient's adherence to plan as established by provider. Determined [name] is having increased difficulty caring for the patient due to her work schedule restricts her ability to care for the patient during the daytime hours Determined [name]'s mother is assisting with care for Ms. Batz, however she can only offer short term assistance Discussed concerns related to patient's dementia including persistent confusion,  incontinence and wandering Provided information regarding PACE and [facility], further discussed how these services work and advised the cost for these services will be determined by the provider depending on income and the services needed Determined [name] completed an  [facility] after which it was determined Ms. Lanni is eligible for an additional $300 per month  Discussed [name]'s plan to use patient's monthly income of approximately $3000 to help pay for placement and or private care in the home Collaboration with embedded BSW Daneen Schick requesting she provide Ms. Gorden Harms with additional information regarding these resources as well as information concerning LTC  Sent a list of facilities via email for niece [name] to review per her request in order to help determine if she will consider placement for patient  Discussed plans with patient for ongoing care management follow up and provided patient with direct contact information for care management team  Patient Goals/Self-Care Activities: Patient will attend all scheduled provider appointments Patient will attend church or other social activities Patient will continue to perform ADL's independently Patient will continue to perform IADL's independently Patient will work with BSW to address care coordination needs and will continue to work with the clinical team to address health care and disease management related needs.    Follow Up Plan:  Telephone follow up appointment with care management team member scheduled for:  03/03/21      Consent to CCM Services: Ms. Waltermire was given information about Chronic Care Management services including:  CCM service includes personalized support from designated clinical staff supervised by her physician, including individualized plan of care and coordination with other care providers 24/7 contact phone numbers for assistance for urgent and routine care needs. Service will only be billed  when office clinical staff spend 20 minutes or more in a month to coordinate care. Only one practitioner may furnish and bill the service in a calendar month. The patient may stop CCM services at any time (effective at the end of the month) by phone call to the office staff. The patient will be responsible for cost sharing (co-pay) of up to 20% of the service fee (after annual deductible is met).  Patient agreed to services and verbal consent obtained.   Patient verbalizes understanding of instructions provided today and agrees to view in [place].   Telephone follow up appointment with care management team member scheduled for: 03/03/21

## 2021-01-01 NOTE — Chronic Care Management (AMB) (Signed)
Chronic Care Management   CCM RN Visit Note  12/31/2020 Name: Tammy [name] MRN: [id number] DOB: September 18, 1933  Subjective: Tammy [name] is a 87 y.o. year old female who is a primary care patient of Arnette Felts, FNP. The care management team was consulted for assistance with disease management and care coordination needs.    Engaged with patient by telephone for follow up visit in response to provider referral for case management and/or care coordination services.   Consent to Services:  The patient was given information about Chronic Care Management services, agreed to services, and gave verbal consent prior to initiation of services.  Please see initial visit note for detailed documentation.   Patient agreed to services and verbal consent obtained.   Assessment: Review of patient past medical history, allergies, medications, health status, including review of consultants reports, laboratory and other test data, was performed as part of comprehensive evaluation and provision of chronic care management services.   SDOH (Social Determinants of Health) assessments and interventions performed:  Yes, no acute challenges   CCM Care Plan  Allergies  Allergen Reactions   Advil [Ibuprofen] Itching   Ciprofloxacin Other (See Comments)    REACTION: hives   Zolpidem Tartrate Other (See Comments)    REACTION: trembling   Unisom [Doxylamine] Swelling and Rash    Outpatient Encounter Medications as of 12/31/2020  Medication Sig   acetaminophen (TYLENOL) 500 MG tablet Take 500 mg by mouth every 6 (six) hours as needed for mild pain.   amlodipine-olmesartan (AZOR) 10-20 MG tablet TAKE 1 TABLET BY MOUTH ONCE DAILY.   bimatoprost (LUMIGAN) 0.03 % ophthalmic solution Place 1 drop into both eyes at bedtime.   calcium-vitamin D (OSCAL WITH D) 500-200 MG-UNIT per tablet Take 1 tablet by mouth daily.   diclofenac Sodium (VOLTAREN) 1 % GEL Apply 2 g topically 4 (four) times daily.   donepezil  (ARICEPT) 10 MG tablet TAKE ONE TABLET BY MOUTH AT BEDTIME.   hydrOXYzine (ATARAX/VISTARIL) 25 MG tablet Take 1 tablet (25 mg total) by mouth daily as needed. For agitation (Patient not taking: Reported on 10/23/2020)   Magnesium 250 MG TABS Take 1 tablet by mouth with evening meal   omeprazole (PRILOSEC) 20 MG capsule TAKE (1) CAPSULE BY MOUTH ONCE DAILY.   No facility-administered encounter medications on file as of 12/31/2020.    Patient Active Problem List   Diagnosis Date Noted   Ankle fracture, bimalleolar, closed 04/03/2020   Unilateral primary osteoarthritis, right knee 05/29/2019   Gait disturbance 07/04/2018   Right knee pain 07/04/2018   Alzheimer disease (HCC) 07/04/2018   Iron deficiency 11/23/2017   Mixed hyperlipidemia 11/23/2017   Memory loss 11/23/2017   History of falling 11/23/2017   Leukocytosis 10/17/2016   Diverticulosis of large intestine with hemorrhage    Essential hypertension 09/26/2015   Hypokalemia 09/26/2015   HIP REPLACEMENT, RIGHT, HX OF 05/27/2008   VITAMIN D DEFICIENCY 05/13/2008   GERD 05/13/2008   OSTEOARTHRITIS, HIPS, BILATERAL 05/13/2008    Conditions to be addressed/monitored: Essential hypertension, Mixed hyperlipidemia, Atherosclerotic cardiovascular disease, 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  Care Plan : RN Care Manager Plan of Care  Updates made by Tammy Churches, RN since 12/31/2020 12:00 AM     Problem: No plan established for management of chronic disease states (Essential hypertension, Mixed hyperlipidemia, Atherosclerotic cardiovascular disease, 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)   Priority: High     Long-Range Goal: Establishment of plan of care for management  of chronic diseases (Essential hypertension, Mixed hyperlipidemia, Atherosclerotic cardiovascular disease, 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)   Start Date: 12/02/2020  Expected End Date: 12/02/2021   Recent Progress: On track  Priority: High  Note:   Current Barriers:  Knowledge Deficits related to plan of care for management of Essential hypertension, Mixed hyperlipidemia, Atherosclerotic cardiovascular disease, 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 Chronic Disease Management support and education needs related to Essential hypertension, Mixed hyperlipidemia, Atherosclerotic cardiovascular disease, 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 Cognitive Deficits  RNCM Clinical Goal(s):  Patient will verbalize basic understanding of  Essential hypertension, Mixed hyperlipidemia, Atherosclerotic cardiovascular disease, 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 disease process and self health management plan   take all medications exactly as prescribed and will call provider for medication related questions demonstrate Improved health management independence   continue to work with RN Care Manager to address care management and care coordination needs related to  Essential hypertension, Mixed hyperlipidemia, Atherosclerotic cardiovascular disease, 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 work with Child psychotherapist to address  related to the management of Cognitive Deficits, Memory Deficits, and Lacks knowledge of community resource: respite/custodial care related to the management of Essential hypertension, Mixed hyperlipidemia, Atherosclerotic cardiovascular disease, 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   Interventions: 1:1 collaboration with primary care provider regarding development and update of comprehensive plan of care as evidenced by provider attestation and co-signature Inter-disciplinary care team collaboration (see longitudinal plan of care) Evaluation of current treatment plan related to  self management and patient's  adherence to plan as established by provider  Dementia: (Status: New goal.) Completed successful outbound call with niece Tammy [name] Evaluation of current treatment plan related to Dementia, self-management and patient's adherence to plan as established by provider. Determined Tammy [name] is having increased difficulty caring for the patient due to her work schedule restricts her ability to care for the patient during the daytime hours Determined Tammy [name]'s mother is assisting with care for Tammy [name], however she can only offer short term assistance Discussed concerns related to patient's dementia including persistent confusion, incontinence and wandering Provided information regarding PACE and [facility], further discussed how these services work and advised the cost for these services will be determined by the provider depending on income and the services needed Determined Tammy [name] [facility] after which it was determined Tammy [name] is eligible for an additional $300 per month  Discussed Tammy [name]'s plan to use patient's monthly income of approximately $3000 to help pay for placement and or private care in the home Collaboration with embedded BSW Tammy [name] requesting she provide Tammy [name] with additional information regarding these resources as well as information concerning LTC  Sent a list of facilities via email for niece Tammy [name] to review per her request in order to help determine if she will consider placement for patient  Discussed plans with patient for ongoing care management follow up and provided patient with direct contact information for care management team  Patient Goals/Self-Care Activities: Patient will attend all scheduled provider appointments Patient will attend church or other social activities Patient will continue to perform ADL's independently Patient will continue to perform IADL's independently Patient will work with BSW to address care coordination needs  and will continue to work with the clinical team to address health care and disease management related needs.    Follow Up Plan:  Telephone follow  up appointment with care management team member scheduled for:  03/03/21      Plan:Telephone follow up appointment with care management team member scheduled for:  03/03/21  Delsa Sale, RN, BSN, CCM Care Management Coordinator [facility] Care Management/[facility]  Direct Phone: [phone]

## 2021-01-02 ENCOUNTER — Ambulatory Visit: Payer: Medicare Other

## 2021-01-02 DIAGNOSIS — G301 Alzheimer's disease with late onset: Secondary | ICD-10-CM

## 2021-01-02 DIAGNOSIS — I10 Essential (primary) hypertension: Secondary | ICD-10-CM

## 2021-01-02 DIAGNOSIS — E782 Mixed hyperlipidemia: Secondary | ICD-10-CM

## 2021-01-02 NOTE — Chronic Care Management (AMB) (Signed)
Chronic Care Management    Social Work Note  01/02/2021 Name: Tammy [name] MRN: [id number] DOB: 1933-06-22  Tammy [name] is a 87 y.o. year old female who is a primary care patient of Arnette Felts, FNP. The CCM team was consulted to assist the patient with chronic disease management and/or care coordination needs related to:  Alzheimer's Disease, HTN, Mixed Hyperlipidemia .   Collaboration with [facility]    to determine patient eligibility  in response to provider referral for social work chronic care management and care coordination services.   Consent to Services:  The patient was given information about Chronic Care Management services, agreed to services, and gave verbal consent prior to initiation of services.  Please see initial visit note for detailed documentation.   Patient agreed to services and consent obtained.   Assessment: Review of patient past medical history, allergies, medications, and health status, including review of relevant consultants reports was performed today as part of a comprehensive evaluation and provision of chronic care management and care coordination services.     SDOH (Social Determinants of Health) assessments and interventions performed:    Advanced Directives Status: Not addressed in this encounter.  CCM Care Plan  Allergies  Allergen Reactions   Advil [Ibuprofen] Itching   Ciprofloxacin Other (See Comments)    REACTION: hives   Zolpidem Tartrate Other (See Comments)    REACTION: trembling   Unisom [Doxylamine] Swelling and Rash    Outpatient Encounter Medications as of 01/02/2021  Medication Sig   acetaminophen (TYLENOL) 500 MG tablet Take 500 mg by mouth every 6 (six) hours as needed for mild pain.   amlodipine-olmesartan (AZOR) 10-20 MG tablet TAKE 1 TABLET BY MOUTH ONCE DAILY.   bimatoprost (LUMIGAN) 0.03 % ophthalmic solution Place 1 drop into both eyes at bedtime.   calcium-vitamin D (OSCAL WITH D) 500-200  MG-UNIT per tablet Take 1 tablet by mouth daily.   diclofenac Sodium (VOLTAREN) 1 % GEL Apply 2 g topically 4 (four) times daily.   donepezil (ARICEPT) 10 MG tablet TAKE ONE TABLET BY MOUTH AT BEDTIME.   hydrOXYzine (ATARAX/VISTARIL) 25 MG tablet Take 1 tablet (25 mg total) by mouth daily as needed. For agitation (Patient not taking: Reported on 10/23/2020)   Magnesium 250 MG TABS Take 1 tablet by mouth with evening meal   omeprazole (PRILOSEC) 20 MG capsule TAKE (1) CAPSULE BY MOUTH ONCE DAILY.   No facility-administered encounter medications on file as of 01/02/2021.    Patient Active Problem List   Diagnosis Date Noted   Ankle fracture, bimalleolar, closed 04/03/2020   Unilateral primary osteoarthritis, right knee 05/29/2019   Gait disturbance 07/04/2018   Right knee pain 07/04/2018   Alzheimer disease (HCC) 07/04/2018   Iron deficiency 11/23/2017   Mixed hyperlipidemia 11/23/2017   Memory loss 11/23/2017   History of falling 11/23/2017   Leukocytosis 10/17/2016   Diverticulosis of large intestine with hemorrhage    Essential hypertension 09/26/2015   Hypokalemia 09/26/2015   HIP REPLACEMENT, RIGHT, HX OF 05/27/2008   VITAMIN D DEFICIENCY 05/13/2008   GERD 05/13/2008   OSTEOARTHRITIS, HIPS, BILATERAL 05/13/2008    Conditions to be addressed/monitored:  Alzheimer's Disease, HTN, Mixed Hyperlipidemia ; Limited access to caregiver  Care Plan : Social Work Care Plan  Updates made by [name] since 01/02/2021 12:00 AM     Problem: Home and Family Safety (Wellness)      Goal: Home and Family Safety Maintained   Start Date: 12/03/2020  Recent Progress: On track  Priority: High  Note:   Current Barriers:  Chronic disease management support and education needs related to  HTN, Mixed Hyperlipidemia, Alzheimer's Disease   Housing barriers - in a home that is difficult to navigate Limited access to caregiver  Social Worker Clinical Goal(s):  patient will work with SW to  identify and address any acute and/or chronic care coordination needs related to the self health management of  HTN, Mixed Hyperlipidemia, and Alzheimer's Disease    SW Interventions:  Inter-disciplinary care team collaboration (see longitudinal plan of care) Collaboration with Arnette Felts, FNP regarding development and update of comprehensive plan of care as evidenced by provider attestation and co-signature Communication received from RN Care Manager advising she spoke with patients niece [name] who may be interested in a PACE program Discussed [employer] is a [employer] who serves both [place] and [place] Outbound call placed to [employer], spoke with [employer] who stated only a portion of [place] is eligible to participate in their program Determined [employer] was not currently at her desk, provided with patients address  Lilly to contact SW once determined if patient is eligible for program  Patient Goals/Self-Care Activities patient will: with the help of her niece [name]  -  Engage with home health SW to identify resources available to the patient -Contact SW as needed prior to next scheduled call  Follow Up Plan:  SW will follow up with [name] on 12.15.22       Follow Up Plan: SW will follow up with patient by phone over the next 10 days      [name], BSW, CDP Social Worker, Certified Dementia Practitioner [facility] Care Management [phone]

## 2021-01-06 DIAGNOSIS — K219 Gastro-esophageal reflux disease without esophagitis: Secondary | ICD-10-CM | POA: Diagnosis not present

## 2021-01-06 DIAGNOSIS — I10 Essential (primary) hypertension: Secondary | ICD-10-CM | POA: Diagnosis not present

## 2021-01-06 DIAGNOSIS — E782 Mixed hyperlipidemia: Secondary | ICD-10-CM | POA: Diagnosis not present

## 2021-01-06 DIAGNOSIS — G3184 Mild cognitive impairment, so stated: Secondary | ICD-10-CM | POA: Diagnosis not present

## 2021-01-06 DIAGNOSIS — H409 Unspecified glaucoma: Secondary | ICD-10-CM | POA: Diagnosis not present

## 2021-01-08 ENCOUNTER — Ambulatory Visit (INDEPENDENT_AMBULATORY_CARE_PROVIDER_SITE_OTHER): Payer: Medicare Other

## 2021-01-08 DIAGNOSIS — G301 Alzheimer's disease with late onset: Secondary | ICD-10-CM

## 2021-01-08 DIAGNOSIS — I10 Essential (primary) hypertension: Secondary | ICD-10-CM

## 2021-01-08 DIAGNOSIS — E782 Mixed hyperlipidemia: Secondary | ICD-10-CM

## 2021-01-08 NOTE — Chronic Care Management (AMB) (Signed)
Chronic Care Management    Social Work Note  01/08/2021 Name: Tammy [name] MRN: [id number] DOB: 06-11-1933  Tammy [name] is a 87 y.o. year old female who is a primary care patient of Tammy Felts, Tammy [name]. The CCM team was consulted to assist the patient with chronic disease management and/or care coordination needs related to:  HTN, Mixed Hyperlipidemia, Alzheimer's Disease .   Engaged with patients niece and caregiver Tammy [name] by phone  for follow up visit in response to provider referral for social work chronic care management and care coordination services.   Consent to Services:  The patient was given information about Chronic Care Management services, agreed to services, and gave verbal consent prior to initiation of services.  Please see initial visit note for detailed documentation.   Patient agreed to services and consent obtained.   Assessment: Review of patient past medical history, allergies, medications, and health status, including review of relevant consultants reports was performed today as part of a comprehensive evaluation and provision of chronic care management and care coordination services.     SDOH (Social Determinants of Health) assessments and interventions performed:    Advanced Directives Status: Not addressed in this encounter.  CCM Care Plan  Allergies  Allergen Reactions   Advil [Ibuprofen] Itching   Ciprofloxacin Other (See Comments)    REACTION: hives   Zolpidem Tartrate Other (See Comments)    REACTION: trembling   Unisom [Doxylamine] Swelling and Rash    Outpatient Encounter Medications as of 01/08/2021  Medication Sig   acetaminophen (TYLENOL) 500 MG tablet Take 500 mg by mouth every 6 (six) hours as needed for mild pain.   amlodipine-olmesartan (AZOR) 10-20 MG tablet TAKE 1 TABLET BY MOUTH ONCE DAILY.   bimatoprost (LUMIGAN) 0.03 % ophthalmic solution Place 1 drop into both eyes at bedtime.   calcium-vitamin D (OSCAL WITH D) 500-200  MG-UNIT per tablet Take 1 tablet by mouth daily.   diclofenac Sodium (VOLTAREN) 1 % GEL Apply 2 g topically 4 (four) times daily.   donepezil (ARICEPT) 10 MG tablet TAKE ONE TABLET BY MOUTH AT BEDTIME.   hydrOXYzine (ATARAX/VISTARIL) 25 MG tablet Take 1 tablet (25 mg total) by mouth daily as needed. For agitation (Patient not taking: Reported on 10/23/2020)   Magnesium 250 MG TABS Take 1 tablet by mouth with evening meal   omeprazole (PRILOSEC) 20 MG capsule TAKE (1) CAPSULE BY MOUTH ONCE DAILY.   No facility-administered encounter medications on file as of 01/08/2021.    Patient Active Problem List   Diagnosis Date Noted   Ankle fracture, bimalleolar, closed 04/03/2020   Unilateral primary osteoarthritis, right knee 05/29/2019   Gait disturbance 07/04/2018   Right knee pain 07/04/2018   Alzheimer disease (HCC) 07/04/2018   Iron deficiency 11/23/2017   Mixed hyperlipidemia 11/23/2017   Memory loss 11/23/2017   History of falling 11/23/2017   Leukocytosis 10/17/2016   Diverticulosis of large intestine with hemorrhage    Essential hypertension 09/26/2015   Hypokalemia 09/26/2015   HIP REPLACEMENT, RIGHT, HX OF 05/27/2008   VITAMIN D DEFICIENCY 05/13/2008   GERD 05/13/2008   OSTEOARTHRITIS, HIPS, BILATERAL 05/13/2008    Conditions to be addressed/monitored: HTN, Dementia, and Mixed Hyperlipidemia ; Limited access to caregiver  Care Plan : Social Work Care Plan  Updates made by [name] since 01/08/2021 12:00 AM     Problem: Home and Family Safety (Wellness)      Goal: Home and Family Safety Maintained   Start Date: 12/03/2020  This Visit's Progress: On track  Recent Progress: On track  Priority: High  Note:   Current Barriers:  Chronic disease management support and education needs related to  HTN, Mixed Hyperlipidemia, Alzheimer's Disease   Housing barriers - in a home that is difficult to navigate Limited access to caregiver  Social Worker Clinical Goal(s):   patient will work with SW to identify and address any acute and/or chronic care coordination needs related to the self health management of  HTN, Mixed Hyperlipidemia, and Alzheimer's Disease    SW Interventions:  Inter-disciplinary care team collaboration (see longitudinal plan of care) Collaboration with Tammy Felts, Tammy [name] regarding development and update of comprehensive plan of care as evidenced by provider attestation and co-signature Voice message received from [facility] with [facility] indicating the patient is eligible to participate in their PACE program Successful outbound call placed to the patients niece and caregiver Tammy [name] to review caregiver options Advised the patient is in the service area to receive services from [facility] Reviewed since the patient does not have Medicaid she will likely have a monthly co-pay to receive services Discussed Ms. Lin Givens decided to resign form her job today due to concerns with costs of other options. Ms. Lin Givens states she has been paying a caregiver $1200 a month to assist with care. She looked at the [facility] but they charge a $1500 community fee. Ms. Lin Givens indicates she cannot pay for these services long term Assessed if she would be able to utilize the patients income to pay for caregiver options - Ms. Lin Givens indicates she already is and it is not enough Determined Ms. Lin Givens is unsure if she would like to be patients caregiver long term but at this time she feels this is her only option Discussed plans for SW to mail information on [facility] for review in case this may be an option in the future  Patient Goals/Self-Care Activities patient will: with the help of her niece Tammy [name]  -  Review mailed resource information -Contact SW as needed prior to next scheduled call  Follow Up Plan:  SW will follow up with Tammy [name] over the next month       Follow Up Plan: SW  will follow up with patient by phone over the next month      [name], BSW, CDP Social Worker, Certified Dementia Practitioner [facility] Care Management [phone]

## 2021-01-08 NOTE — Patient Instructions (Signed)
Social Worker Visit Information  Goals we discussed today:   Goals Addressed             This Visit's Progress    Home and Family Safety Maintained   On track    Timeframe:  Short-Term Goal Priority:  High Start Date:  11.9.22                                              Next planned outreach: 1.10.23  Patient Goals/Self-Care Activities patient will: with the help of her niece [name]  - Review mailed resource information -Contact SW as needed prior to next scheduled call         Materials Provided: Yes: mailed information on [facility]  Patient verbalizes understanding of instructions provided today and agrees to view in [place].   Follow Up Plan: SW will follow up with patient by phone over the next month  [name], BSW, CDP Social Worker, Certified Dementia Practitioner [facility] Care Management [phone]

## 2021-01-24 DIAGNOSIS — I10 Essential (primary) hypertension: Secondary | ICD-10-CM

## 2021-01-24 DIAGNOSIS — E782 Mixed hyperlipidemia: Secondary | ICD-10-CM

## 2021-01-24 DIAGNOSIS — F0280 Dementia in other diseases classified elsewhere without behavioral disturbance: Secondary | ICD-10-CM

## 2021-01-24 DIAGNOSIS — G301 Alzheimer's disease with late onset: Secondary | ICD-10-CM

## 2021-01-27 ENCOUNTER — Ambulatory Visit (INDEPENDENT_AMBULATORY_CARE_PROVIDER_SITE_OTHER): Payer: Medicare Other

## 2021-01-27 DIAGNOSIS — E782 Mixed hyperlipidemia: Secondary | ICD-10-CM

## 2021-01-27 DIAGNOSIS — F0280 Dementia in other diseases classified elsewhere without behavioral disturbance: Secondary | ICD-10-CM

## 2021-01-27 DIAGNOSIS — I10 Essential (primary) hypertension: Secondary | ICD-10-CM

## 2021-01-27 NOTE — Patient Instructions (Signed)
Social Worker Visit Information  Goals we discussed today:   Goals Addressed             This Visit's Progress    Home and Family Safety Maintained       Timeframe:  Short-Term Goal Priority:  High Start Date:  11.9.22                                              Next planned outreach: 1.17.23  Patient Goals/Self-Care Activities patient will: with the help of her niece [name]  - Review mailed resource information -Contact SW as needed prior to next scheduled call         Materials Provided: Yes: verbal and written education on CAP/DA and LTC Medicaid  Patient verbalizes understanding of instructions provided today and agrees to view in [place].   Follow Up Plan: SW will follow up with patient by phone over the next 21 days   Daneen Schick, BSW, CDP Social Worker, Certified Dementia Practitioner [place] / [place] Management [phone]

## 2021-01-27 NOTE — Chronic Care Management (AMB) (Signed)
Chronic Care Management    Social Work Note  01/27/2021 Name: Tammy [name] MRN: [id number] DOB: February 13, 1933  Tammy [name] is a 88 y.o. year old female who is a primary care patient of [name], [place]. The CCM team was consulted to assist the patient with chronic disease management and/or care coordination needs related to:  HTN, Mixed Hyperlipidemia, Alzheimer's Disease .   Engaged with patients niece and caregiver [name] by phone  for follow up visit in response to provider referral for social work chronic care management and care coordination services.   Consent to Services:  The patient was given information about Chronic Care Management services, agreed to services, and gave verbal consent prior to initiation of services.  Please see initial visit note for detailed documentation.   Patient agreed to services and consent obtained.   Assessment: Review of patient past medical history, allergies, medications, and health status, including review of relevant consultants reports was performed today as part of a comprehensive evaluation and provision of chronic care management and care coordination services.     SDOH (Social Determinants of Health) assessments and interventions performed:    Advanced Directives Status: Not addressed in this encounter.  CCM Care Plan  Allergies  Allergen Reactions   Advil [Ibuprofen] Itching   Ciprofloxacin Other (See Comments)    REACTION: hives   Zolpidem Tartrate Other (See Comments)    REACTION: trembling   Unisom [Doxylamine] Swelling and Rash    Outpatient Encounter Medications as of 01/27/2021  Medication Sig   acetaminophen (TYLENOL) 500 MG tablet Take 500 mg by mouth every 6 (six) hours as needed for mild pain.   amlodipine-olmesartan (AZOR) 10-20 MG tablet TAKE 1 TABLET BY MOUTH ONCE DAILY.   bimatoprost (LUMIGAN) 0.03 % ophthalmic solution Place 1 drop into both eyes at bedtime.   calcium-vitamin D (OSCAL WITH D) 500-200  MG-UNIT per tablet Take 1 tablet by mouth daily.   diclofenac Sodium (VOLTAREN) 1 % GEL Apply 2 g topically 4 (four) times daily.   donepezil (ARICEPT) 10 MG tablet TAKE ONE TABLET BY MOUTH AT BEDTIME.   hydrOXYzine (ATARAX/VISTARIL) 25 MG tablet Take 1 tablet (25 mg total) by mouth daily as needed. For agitation (Patient not taking: Reported on 10/23/2020)   Magnesium 250 MG TABS Take 1 tablet by mouth with evening meal   omeprazole (PRILOSEC) 20 MG capsule TAKE (1) CAPSULE BY MOUTH ONCE DAILY.   No facility-administered encounter medications on file as of 01/27/2021.    Patient Active Problem List   Diagnosis Date Noted   Ankle fracture, bimalleolar, closed 04/03/2020   Unilateral primary osteoarthritis, right knee 05/29/2019   Gait disturbance 07/04/2018   Right knee pain 07/04/2018   Alzheimer disease ([place]) 07/04/2018   Iron deficiency 11/23/2017   Mixed hyperlipidemia 11/23/2017   Memory loss 11/23/2017   History of falling 11/23/2017   Leukocytosis 10/17/2016   Diverticulosis of large intestine with hemorrhage    Essential hypertension 09/26/2015   Hypokalemia 09/26/2015   HIP REPLACEMENT, RIGHT, HX OF 05/27/2008   VITAMIN D DEFICIENCY 05/13/2008   GERD 05/13/2008   OSTEOARTHRITIS, HIPS, BILATERAL 05/13/2008    Conditions to be addressed/monitored:  HTN, Mixed Hyperlipidemia, Alzheimer's Disease ; Level of care concerns  Care Plan : Social Work Care Plan  Updates made by Daneen Schick since 01/27/2021 12:00 AM     Problem: Home and Family Safety (Wellness)      Goal: Home and Family Safety Maintained   Start Date: 12/03/2020  Recent Progress: On track  Priority: High  Note:   Current Barriers:  Chronic disease management support and education needs related to  HTN, Mixed Hyperlipidemia, Alzheimer's Disease   Housing barriers - in a home that is difficult to navigate Limited access to caregiver  Social Worker Clinical Goal(s):  patient will work with SW to identify  and address any acute and/or chronic care coordination needs related to the self health management of  HTN, Mixed Hyperlipidemia, and Alzheimer's Disease    SW Interventions:  Inter-disciplinary care team collaboration (see longitudinal plan of care) Collaboration with [name], FNP regarding development and update of comprehensive plan of care as evidenced by provider attestation and co-signature Voice message received from patients niece and caregiver [name] requesting SW contact re: CAP/DA program Successful outbound call placed to [name] who indicates the home health SW came to the patients home and advised the patient should qualify for LTC Medicaid and/or CAP services for [name] to get paid to care for her Discussed application process of both program advising that once patient is placed under Medicaid she will no longer receive income and will have to spend down assets Determined [name] is unable to work with the patient in her home and feels bad living off the patients income Discussed the patient is essentially paying [name] for her care rather than someone else and that if she is placed she will lose her income Reviewed [name] plans to contact the [place] to request a VA advocate in order to navigate possible benefits for the patient Discussed plans for SW to mail both a Medicaid application and a CAP application for review and completion in the event [name] would like to pursue either resource on behalf of the patient  Patient Goals/Self-Care Activities patient will: with the help of her niece [name]  -  Review mailed resource information -Contact SW as needed prior to next scheduled call  Follow Up Plan:  SW will follow up with [name] over the next 21 days       Follow Up Plan: SW will follow up with patient by phone over the next 21 days      Daneen Schick, Arita Miss, CDP Social Worker, Certified Dementia Practitioner [place]  / [place] [facility] [phone]

## 2021-02-03 ENCOUNTER — Telehealth: Payer: Medicare Other

## 2021-02-10 ENCOUNTER — Telehealth: Payer: Medicare Other

## 2021-02-10 ENCOUNTER — Telehealth: Payer: Self-pay

## 2021-02-10 NOTE — Telephone Encounter (Signed)
°  Care Management   Follow Up Note   02/10/2021 Name: Tammy [name] MRN: [id number] DOB: [birth year]   Referred by: [name], FNP Reason for referral : Chronic Care Management   Successful outbound call placed to the patients caregiver Tammy [name]. Advised [name] could not speak at this time.  Follow Up Plan:  Call rescheduled.  Daneen Schick, BSW, CDP Social Worker, Certified Dementia Practitioner [place] / [place] [facility] [phone]

## 2021-02-12 ENCOUNTER — Other Ambulatory Visit: Payer: Self-pay | Admitting: Nurse Practitioner

## 2021-02-13 ENCOUNTER — Ambulatory Visit: Payer: Medicare Other

## 2021-02-13 DIAGNOSIS — G301 Alzheimer's disease with late onset: Secondary | ICD-10-CM

## 2021-02-13 DIAGNOSIS — I10 Essential (primary) hypertension: Secondary | ICD-10-CM

## 2021-02-13 DIAGNOSIS — E782 Mixed hyperlipidemia: Secondary | ICD-10-CM

## 2021-02-13 NOTE — Patient Instructions (Signed)
Social Worker Visit Information  Goals we discussed today:   Goals Addressed             This Visit's Progress    Home and Family Safety Maintained   On track    Timeframe:  Short-Term Goal Priority:  High Start Date:  11.9.22                                              Next planned outreach: 2.1.23  Patient Goals/Self-Care Activities patient will: with the help of her niece Tammy [name]  - Follow up with primary care provider regarding possible depression -Engage with community resources to obtain caregiver assistance -Contact SW as needed prior to next scheduled call         Materials Provided: Verbal education about placement process provided by phone  Patient verbalizes understanding of instructions and care plan provided today and agrees to view in MyChart. Active MyChart status confirmed with patient.    Follow Up Plan: SW will follow up with patient by phone over the next 21 days   Tammy [name], BSW, CDP Social Worker, Certified Dementia Practitioner [facility] Care Management [phone]

## 2021-02-13 NOTE — Chronic Care Management (AMB) (Signed)
Chronic Care Management    Social Work Note  02/13/2021 Name: Tammy [name] MRN: [id number] DOB: Oct 06, 1933  Tammy [name] is a 88 y.o. year old female who is a primary care patient of Arnette Felts, FNP. The CCM team was consulted to assist the patient with chronic disease management and/or care coordination needs related to:  HTN, Mixed Hyperlipidemia, Alzheimer's Disease .   Engaged with patients niece/caregiver [name] by phone  for follow up visit in response to provider referral for social work chronic care management and care coordination services.   Consent to Services:  The patient was given information about Chronic Care Management services, agreed to services, and gave verbal consent prior to initiation of services.  Please see initial visit note for detailed documentation.   Patient agreed to services and consent obtained.   Assessment: Review of patient past medical history, allergies, medications, and health status, including review of relevant consultants reports was performed today as part of a comprehensive evaluation and provision of chronic care management and care coordination services.     SDOH (Social Determinants of Health) assessments and interventions performed:    Advanced Directives Status: Not addressed in this encounter.  CCM Care Plan  Allergies  Allergen Reactions   Advil [Ibuprofen] Itching   Ciprofloxacin Other (See Comments)    REACTION: hives   Zolpidem Tartrate Other (See Comments)    REACTION: trembling   Unisom [Doxylamine] Swelling and Rash    Outpatient Encounter Medications as of 02/13/2021  Medication Sig   acetaminophen (TYLENOL) 500 MG tablet Take 500 mg by mouth every 6 (six) hours as needed for mild pain.   amlodipine-olmesartan (AZOR) 10-20 MG tablet TAKE 1 TABLET BY MOUTH ONCE DAILY.   bimatoprost (LUMIGAN) 0.03 % ophthalmic solution Place 1 drop into both eyes at bedtime.   calcium-vitamin D (OSCAL WITH D) 500-200 MG-UNIT  per tablet Take 1 tablet by mouth daily.   diclofenac Sodium (VOLTAREN) 1 % GEL Apply 2 g topically 4 (four) times daily.   donepezil (ARICEPT) 10 MG tablet TAKE ONE TABLET BY MOUTH AT BEDTIME.   hydrOXYzine (ATARAX/VISTARIL) 25 MG tablet Take 1 tablet (25 mg total) by mouth daily as needed. For agitation (Patient not taking: Reported on 10/23/2020)   Magnesium 250 MG TABS Take 1 tablet by mouth with evening meal   omeprazole (PRILOSEC) 20 MG capsule TAKE (1) CAPSULE BY MOUTH ONCE DAILY.   No facility-administered encounter medications on file as of 02/13/2021.    Patient Active Problem List   Diagnosis Date Noted   Ankle fracture, bimalleolar, closed 04/03/2020   Unilateral primary osteoarthritis, right knee 05/29/2019   Gait disturbance 07/04/2018   Right knee pain 07/04/2018   Alzheimer disease ([facility]) 07/04/2018   Iron deficiency 11/23/2017   Mixed hyperlipidemia 11/23/2017   Memory loss 11/23/2017   History of falling 11/23/2017   Leukocytosis 10/17/2016   Diverticulosis of large intestine with hemorrhage    Essential hypertension 09/26/2015   Hypokalemia 09/26/2015   HIP REPLACEMENT, RIGHT, HX OF 05/27/2008   VITAMIN D DEFICIENCY 05/13/2008   GERD 05/13/2008   OSTEOARTHRITIS, HIPS, BILATERAL 05/13/2008    Conditions to be addressed/monitored:  HTN, Mixed Hyperlipidemia, Alzheimer's Disease ; ADL IADL limitations  Care Plan : Social Work Care Plan  Updates made by [name] since 02/13/2021 12:00 AM     Problem: Home and Family Safety (Wellness)      Goal: Home and Family Safety Maintained   Start Date: 12/03/2020  This Visit's  Progress: On track  Recent Progress: On track  Priority: High  Note:   Current Barriers:  Chronic disease management support and education needs related to  HTN, Mixed Hyperlipidemia, Alzheimer's Disease   Housing barriers - in a home that is difficult to navigate Limited access to caregiver  Social Worker Clinical Goal(s):  patient will  work with SW to identify and address any acute and/or chronic care coordination needs related to the self health management of  HTN, Mixed Hyperlipidemia, and Alzheimer's Disease    SW Interventions:  Inter-disciplinary care team collaboration (see longitudinal plan of care) Collaboration with Arnette Felts, FNP regarding development and update of comprehensive plan of care as evidenced by provider attestation and co-signature Telephonic visit completed with the patients caregiver [name] to assess goal progression Discussed [name] has mailed in [facility] application to see if patient will qualify for assistance Reviewed she has spoken with a VA representative regarding caregiver assistance and is awaiting to be informed if the patient will qualify Determined [name] is also in the process of applying for CAP services and will plan to bring application in for patients provider to complete Reviewed the process for placement in case this is needed in the future Advised by [name] the patient has been depressed due to ADL/iADL limitations and has been making statements such as "I should just be dead" Discussed plan for SW to outreach patients provider to report statements and request an office visit for depression and possibility of adding an anti-depressant. [name] agrees with plan Collaboration with Arnette Felts FNP who reports she will have a staff member contact the patient to schedule an office visit Collaboration with Delsa Sale RN Care Manager to advise on above interventions and plan  Patient Goals/Self-Care Activities patient will: with the help of her niece [name]  -  Follow up with primary care provider regarding possible depression -Engage with community resources to obtain caregiver assistance -Contact SW as needed prior to next scheduled call  Follow Up Plan:  SW will follow up with [name] over the next 21 days       Follow Up Plan: SW will follow up with patient by phone  over the next 21 days.      [name], BSW, CDP Social Worker, Certified Dementia Practitioner [facility] Care Management [phone]

## 2021-02-16 ENCOUNTER — Encounter: Payer: Self-pay | Admitting: Nurse Practitioner

## 2021-02-16 ENCOUNTER — Ambulatory Visit: Payer: Self-pay

## 2021-02-16 ENCOUNTER — Ambulatory Visit (INDEPENDENT_AMBULATORY_CARE_PROVIDER_SITE_OTHER): Payer: Medicare Other | Admitting: Nurse Practitioner

## 2021-02-16 ENCOUNTER — Other Ambulatory Visit: Payer: Self-pay

## 2021-02-16 VITALS — BP 128/70 | HR 69 | Temp 98.5°F | Ht 67.0 in | Wt 141.2 lb

## 2021-02-16 DIAGNOSIS — E782 Mixed hyperlipidemia: Secondary | ICD-10-CM

## 2021-02-16 DIAGNOSIS — I2510 Atherosclerotic heart disease of native coronary artery without angina pectoris: Secondary | ICD-10-CM | POA: Diagnosis not present

## 2021-02-16 DIAGNOSIS — F0280 Dementia in other diseases classified elsewhere without behavioral disturbance: Secondary | ICD-10-CM

## 2021-02-16 DIAGNOSIS — R32 Unspecified urinary incontinence: Secondary | ICD-10-CM | POA: Diagnosis not present

## 2021-02-16 DIAGNOSIS — I119 Hypertensive heart disease without heart failure: Secondary | ICD-10-CM | POA: Diagnosis not present

## 2021-02-16 DIAGNOSIS — I10 Essential (primary) hypertension: Secondary | ICD-10-CM

## 2021-02-16 DIAGNOSIS — G301 Alzheimer's disease with late onset: Secondary | ICD-10-CM

## 2021-02-16 DIAGNOSIS — Z23 Encounter for immunization: Secondary | ICD-10-CM

## 2021-02-16 LAB — POCT URINALYSIS DIPSTICK
Bilirubin, UA: NEGATIVE
Blood, UA: NEGATIVE
Glucose, UA: NEGATIVE
Ketones, UA: NEGATIVE
Nitrite, UA: NEGATIVE
Protein, UA: NEGATIVE
Spec Grav, UA: 1.02 (ref 1.010–1.025)
Urobilinogen, UA: 0.2 E.U./dL
pH, UA: 7.5 (ref 5.0–8.0)

## 2021-02-16 MED ORDER — CITALOPRAM HYDROBROMIDE 10 MG PO TABS: 10.0000 mg | ORAL_TABLET | Freq: Every day | ORAL | 2 refills | Status: DC

## 2021-02-16 NOTE — Progress Notes (Addendum)
I,Tianna Badgett,acting as a Education administrator for [employer], FNP.,have documented all relevant documentation on the behalf of [name], FNP,as directed by  [name], FNP while in the presence of [name], [place].  This visit occurred during the SARS-CoV-2 public health emergency.  Safety protocols were in place, including screening questions prior to the visit, additional usage of staff PPE, and extensive cleaning of exam room while observing appropriate contact time as indicated for disinfecting solutions.  Subjective:     Patient ID: Tammy [name] , female    DOB: 1933/05/08 , 88 y.o.   MRN: [id number]   Chief Complaint  Patient presents with   Hypertension    HPI  Patient presents today for a blood pressure and cholesterol check.  She is using a rollator walker for the last 2 years but now using for long distances. She is not cooking but needs meals prepared. She is unable to bath herself anymore. She is also having urinary incontinence. She is having episodes of saying she should be dead. The patient verbalizes she is now living with her niece and it helps with her grieving for her mother. She also reports her niece cooks for her.    Hypertension This is a chronic problem. The current episode started more than 1 year ago. The problem is unchanged. The problem is controlled. Pertinent negatives include no chest pain, palpitations or shortness of breath. Risk factors for coronary artery disease include sedentary lifestyle. The current treatment provides no improvement. There are no compliance problems.  There is no history of angina. There is no history of chronic renal disease.  Hyperlipidemia This is a chronic problem. The current episode started more than 1 year ago. The problem is controlled. She has no history of chronic renal disease. Pertinent negatives include no chest pain or shortness of breath. There are no compliance problems.   Fall Pertinent negatives include no fever.     Past Medical History:  Diagnosis Date   Acid reflux    Glaucoma    High cholesterol    Hypertension    Lower GI bleed    Rectal bleeding 09/26/2015     Family History  Problem Relation Age of Onset   Hypertension Mother    Hypertension Sister    Hypertension Brother    Heart disease Brother      Current Outpatient Medications:    citalopram (CELEXA) 10 MG tablet, Take 1 tablet (10 mg total) by mouth daily., Disp: 30 tablet, Rfl: 2   acetaminophen (TYLENOL) 500 MG tablet, Take 500 mg by mouth every 6 (six) hours as needed for mild pain., Disp: , Rfl:    amlodipine-olmesartan (AZOR) 10-20 MG tablet, TAKE 1 TABLET BY MOUTH ONCE DAILY., Disp: 28 tablet, Rfl: 11   bimatoprost (LUMIGAN) 0.03 % ophthalmic solution, Place 1 drop into both eyes at bedtime., Disp: , Rfl:    calcium-vitamin D (OSCAL WITH D) 500-200 MG-UNIT per tablet, Take 1 tablet by mouth daily., Disp: , Rfl:    diclofenac Sodium (VOLTAREN) 1 % GEL, Apply 2 g topically 4 (four) times daily., Disp: 100 g, Rfl: 3   donepezil (ARICEPT) 10 MG tablet, TAKE ONE TABLET BY MOUTH AT BEDTIME., Disp: 90 tablet, Rfl: 1   hydrOXYzine (ATARAX/VISTARIL) 25 MG tablet, Take 1 tablet (25 mg total) by mouth daily as needed. For agitation (Patient not taking: Reported on 10/23/2020), Disp: 30 tablet, Rfl: 2   Magnesium 250 MG TABS, Take 1 tablet by mouth with evening meal, Disp:  90 tablet, Rfl: 1   omeprazole (PRILOSEC) 20 MG capsule, TAKE (1) CAPSULE BY MOUTH ONCE DAILY., Disp: 28 capsule, Rfl: 11   oxybutynin (DITROPAN) 5 MG tablet, Take 1 tablet (5 mg total) by mouth 2 (two) times daily., Disp: 60 tablet, Rfl: 1   Allergies  Allergen Reactions   Advil [Ibuprofen] Itching   Ciprofloxacin Other (See Comments)    REACTION: hives   Zolpidem Tartrate Other (See Comments)    REACTION: trembling   Unisom [Doxylamine] Swelling and Rash     Review of Systems  Constitutional: Negative.  Negative for fatigue and fever.  Respiratory:  Negative.  Negative for shortness of breath.   Cardiovascular: Negative.  Negative for chest pain, palpitations and leg swelling.  Gastrointestinal: Negative.   Genitourinary:  Positive for frequency.  Musculoskeletal:        Having more difficulty with ambulation  Neurological: Negative.  Negative for dizziness.  Psychiatric/Behavioral: Negative.      Today's Vitals   02/16/21 1050  BP: 128/70  Pulse: 69  Temp: 98.5 F (36.9 C)  TempSrc: Oral  Weight: 141 lb 3.2 oz (64 kg)  Height: _0  (1.702 m)   Body mass index is 22.12 kg/m.  Wt Readings from Last 3 Encounters:  02/16/21 141 lb 3.2 oz (64 kg)  10/23/20 155 lb (70.3 kg)  07/08/20 154 lb (69.9 kg)    Objective:  Physical Exam Vitals reviewed.  Constitutional:      General: She is not in acute distress.    Appearance: Normal appearance.  Cardiovascular:     Rate and Rhythm: Normal rate and regular rhythm.     Pulses: Normal pulses.     Heart sounds: Normal heart sounds. No murmur heard. Pulmonary:     Effort: Pulmonary effort is normal. No respiratory distress.     Breath sounds: Normal breath sounds. No wheezing.  Neurological:     General: No focal deficit present.     Mental Status: She is alert and oriented to person, place, and time.     Cranial Nerves: No cranial nerve deficit.     Motor: No weakness.  Psychiatric:        Mood and Affect: Mood normal.        Behavior: Behavior normal.        Thought Content: Thought content normal.        Judgment: Judgment normal.        Assessment And Plan:     1. Essential hypertension Comments: Blood pressure is well controlled. Continue current medications - CMP14+EGFR  2. Mixed hyperlipidemia Comments: Stable continue current medications pending labs - Lipid panel - CMP14+EGFR  3. Late onset Alzheimer's disease without behavioral disturbance ([place]) Comments: Niece is interested in another level of care, unsure if Assisted living vs home care with  additional help will complete FL2  4. Atherosclerotic cardiovascular disease Comments: Continue statin  5. Urinary incontinence, unspecified type Comments: She is having more episodes of urinary incontinence, will check urinalysis which was normal.  - POCT Urinalysis Dipstick (33825)     Patient was given opportunity to ask questions. Patient verbalized understanding of the plan and was able to repeat key elements of the plan. All questions were answered to their satisfaction.  [name], FNP   I, [name], FNP, have reviewed all documentation for this visit. The documentation on 02/16/21 for the exam, diagnosis, procedures, and orders are all accurate and complete.   IF YOU HAVE BEEN REFERRED TO A  SPECIALIST, IT MAY TAKE 1-2 WEEKS TO SCHEDULE/PROCESS THE REFERRAL. IF YOU HAVE NOT HEARD FROM US/SPECIALIST IN TWO WEEKS, PLEASE GIVE [place] A CALL AT [phone].   THE PATIENT IS ENCOURAGED TO PRACTICE SOCIAL DISTANCING DUE TO THE COVID-19 PANDEMIC.

## 2021-02-16 NOTE — Chronic Care Management (AMB) (Signed)
Chronic Care Management    Social Work Note  02/16/2021 Name: Tammy [name] MRN: [id number] DOB: [birth year]  Tammy [name] is a 88 y.o. year old female who is a primary care patient of Arnette Felts, FNP. The CCM team was consulted to assist the patient with chronic disease management and/or care coordination needs related to:  Alzheimer's Disease, HTN, Mixed Hyperlipidemia .   Collaboration with primary provider Arnette Felts FNP  for  case discussion  in response to provider referral for social work chronic care management and care coordination services.   Consent to Services:  The patient was given information about Chronic Care Management services, agreed to services, and gave verbal consent prior to initiation of services.  Please see initial visit note for detailed documentation.   Patient agreed to services and consent obtained.   Assessment: Review of patient past medical history, allergies, medications, and health status, including review of relevant consultants reports was performed today as part of a comprehensive evaluation and provision of chronic care management and care coordination services.     SDOH (Social Determinants of Health) assessments and interventions performed:    Advanced Directives Status: Not addressed in this encounter.  CCM Care Plan  Allergies  Allergen Reactions   Advil [Ibuprofen] Itching   Ciprofloxacin Other (See Comments)    REACTION: hives   Zolpidem Tartrate Other (See Comments)    REACTION: trembling   Unisom [Doxylamine] Swelling and Rash    Outpatient Encounter Medications as of 02/16/2021  Medication Sig   acetaminophen (TYLENOL) 500 MG tablet Take 500 mg by mouth every 6 (six) hours as needed for mild pain.   amlodipine-olmesartan (AZOR) 10-20 MG tablet TAKE 1 TABLET BY MOUTH ONCE DAILY.   bimatoprost (LUMIGAN) 0.03 % ophthalmic solution Place 1 drop into both eyes at bedtime.   calcium-vitamin D (OSCAL WITH D) 500-200 MG-UNIT per  tablet Take 1 tablet by mouth daily.   citalopram (CELEXA) 10 MG tablet Take 1 tablet (10 mg total) by mouth daily.   diclofenac Sodium (VOLTAREN) 1 % GEL Apply 2 g topically 4 (four) times daily.   donepezil (ARICEPT) 10 MG tablet TAKE ONE TABLET BY MOUTH AT BEDTIME.   hydrOXYzine (ATARAX/VISTARIL) 25 MG tablet Take 1 tablet (25 mg total) by mouth daily as needed. For agitation (Patient not taking: Reported on 10/23/2020)   Magnesium 250 MG TABS Take 1 tablet by mouth with evening meal   omeprazole (PRILOSEC) 20 MG capsule TAKE (1) CAPSULE BY MOUTH ONCE DAILY.   No facility-administered encounter medications on file as of 02/16/2021.    Patient Active Problem List   Diagnosis Date Noted   Ankle fracture, bimalleolar, closed 04/03/2020   Unilateral primary osteoarthritis, right knee 05/29/2019   Gait disturbance 07/04/2018   Right knee pain 07/04/2018   Alzheimer disease (HCC) 07/04/2018   Iron deficiency 11/23/2017   Mixed hyperlipidemia 11/23/2017   Memory loss 11/23/2017   History of falling 11/23/2017   Leukocytosis 10/17/2016   Diverticulosis of large intestine with hemorrhage    Essential hypertension 09/26/2015   Hypokalemia 09/26/2015   HIP REPLACEMENT, RIGHT, HX OF 05/27/2008   VITAMIN D DEFICIENCY 05/13/2008   GERD 05/13/2008   OSTEOARTHRITIS, HIPS, BILATERAL 05/13/2008    Conditions to be addressed/monitored: HTN and Alzheimer's Disease ; Level of care concerns  Care Plan : Social Work Care Plan  Updates made by [name] since 02/16/2021 12:00 AM     Problem: Home and Family Safety (Wellness)  Goal: Home and Family Safety Maintained   Start Date: 12/03/2020  Recent Progress: On track  Priority: High  Note:   Current Barriers:  Chronic disease management support and education needs related to  HTN, Mixed Hyperlipidemia, Alzheimer's Disease   Housing barriers - in a home that is difficult to navigate Limited access to caregiver  Social Worker Clinical  Goal(s):  patient will work with SW to identify and address any acute and/or chronic care coordination needs related to the self health management of  HTN, Mixed Hyperlipidemia, and Alzheimer's Disease    SW Interventions:  Inter-disciplinary care team collaboration (see longitudinal plan of care) Collaboration with Arnette Felts, FNP regarding development and update of comprehensive plan of care as evidenced by provider attestation and co-signature Collaboration with patients primary care provider who requests SW complete an FL2 form to submit with [facility] paperwork requesting caregiver assistance SW initiated an FL2 and sent to provider Arnette Felts FNP to review and complete including desired  level of care and medications  Patient Goals/Self-Care Activities patient will: with the help of her niece [name]  -  Follow up with primary care provider regarding possible depression -Engage with community resources to obtain caregiver assistance -Contact SW as needed prior to next scheduled call  Follow Up Plan:  SW will follow up with [name] over the next 21 days       Follow Up Plan: SW will follow up with patient by phone over the next 21 days      [name], BSW, CDP Social Worker, Certified Dementia Practitioner [facility] Care Management [phone]

## 2021-02-16 NOTE — Patient Instructions (Signed)

## 2021-02-17 LAB — CMP14+EGFR
ALT: 5 IU/L (ref 0–32)
AST: 11 IU/L (ref 0–40)
Albumin/Globulin Ratio: 1.5 (ref 1.2–2.2)
Albumin: 4.4 g/dL (ref 3.6–4.6)
Alkaline Phosphatase: 150 IU/L — ABNORMAL HIGH (ref 44–121)
BUN/Creatinine Ratio: 10 — ABNORMAL LOW (ref 12–28)
BUN: 9 mg/dL (ref 8–27)
Bilirubin Total: 0.4 mg/dL (ref 0.0–1.2)
CO2: 25 mmol/L (ref 20–29)
Calcium: 10.1 mg/dL (ref 8.7–10.3)
Chloride: 105 mmol/L (ref 96–106)
Creatinine, Ser: 0.86 mg/dL (ref 0.57–1.00)
Globulin, Total: 2.9 g/dL (ref 1.5–4.5)
Glucose: 102 mg/dL — ABNORMAL HIGH (ref 70–99)
Potassium: 4.6 mmol/L (ref 3.5–5.2)
Sodium: 143 mmol/L (ref 134–144)
Total Protein: 7.3 g/dL (ref 6.0–8.5)
eGFR: 66 mL/min/{1.73_m2} (ref >59)

## 2021-02-17 LAB — LIPID PANEL
Chol/HDL Ratio: 3.8 ratio (ref 0.0–4.4)
Cholesterol, Total: 215 mg/dL — ABNORMAL HIGH (ref 100–199)
HDL: 57 mg/dL (ref >39)
LDL Chol Calc (NIH): 142 mg/dL — ABNORMAL HIGH (ref 0–99)
Triglycerides: 91 mg/dL (ref 0–149)
VLDL Cholesterol Cal: 16 mg/dL (ref 5–40)

## 2021-02-24 DIAGNOSIS — I10 Essential (primary) hypertension: Secondary | ICD-10-CM

## 2021-02-24 DIAGNOSIS — G301 Alzheimer's disease with late onset: Secondary | ICD-10-CM

## 2021-02-24 DIAGNOSIS — E782 Mixed hyperlipidemia: Secondary | ICD-10-CM

## 2021-02-24 DIAGNOSIS — F0280 Dementia in other diseases classified elsewhere without behavioral disturbance: Secondary | ICD-10-CM

## 2021-02-25 ENCOUNTER — Encounter: Payer: Self-pay | Admitting: Nurse Practitioner

## 2021-02-25 ENCOUNTER — Ambulatory Visit (INDEPENDENT_AMBULATORY_CARE_PROVIDER_SITE_OTHER): Payer: Medicare Other

## 2021-02-25 DIAGNOSIS — F0280 Dementia in other diseases classified elsewhere without behavioral disturbance: Secondary | ICD-10-CM

## 2021-02-25 DIAGNOSIS — I10 Essential (primary) hypertension: Secondary | ICD-10-CM

## 2021-02-25 DIAGNOSIS — E782 Mixed hyperlipidemia: Secondary | ICD-10-CM

## 2021-02-25 DIAGNOSIS — G301 Alzheimer's disease with late onset: Secondary | ICD-10-CM

## 2021-02-25 NOTE — Patient Instructions (Signed)
Social Worker Visit Information  Goals we discussed today:   Goals Addressed             This Visit's Progress    Home and Family Safety Maintained       Timeframe:  Short-Term Goal Priority:  High Start Date:  11.9.22                                              Next planned outreach: 3.6.23  Patient Goals/Self-Care Activities patient will: with the help of her niece [name]  - Follow up with primary care provider regarding lab results -Engage with [name], [place] representative regarding caregiver resources -Contact SW as needed prior to next scheduled call         Materials Provided: Yes: verbal and written information provided  Patient verbalizes understanding of instructions and care plan provided today and agrees to view in MyChart. Active MyChart status confirmed with patient.    Follow Up Plan: SW will follow up with patient by phone over the next 45 days   [name], BSW, CDP Social Worker, Certified Dementia Practitioner [facility] Care Management [phone]

## 2021-02-25 NOTE — Chronic Care Management (AMB) (Signed)
Chronic Care Management    Social Work Note  02/25/2021 Name: Tammy [name] MRN: [id number] DOB: 12/01/1933  Tammy [name] is a 88 y.o. year old female who is a primary care patient of [name], [place]. The CCM team was consulted to assist the patient with chronic disease management and/or care coordination needs related to:  Alzheimer's Disease, HTN, Mixed Hyperlipidemia .   Engaged with patients niece and POA [name] by phone  for follow up visit in response to provider referral for social work chronic care management and care coordination services.   Consent to Services:  The patient was given information about Chronic Care Management services, agreed to services, and gave verbal consent prior to initiation of services.  Please see initial visit note for detailed documentation.   Patient agreed to services and consent obtained.   Assessment: Review of patient past medical history, allergies, medications, and health status, including review of relevant consultants reports was performed today as part of a comprehensive evaluation and provision of chronic care management and care coordination services.     SDOH (Social Determinants of Health) assessments and interventions performed:    Advanced Directives Status: Not addressed in this encounter.  CCM Care Plan  Allergies  Allergen Reactions   Advil [Ibuprofen] Itching   Ciprofloxacin Other (See Comments)    REACTION: hives   Zolpidem Tartrate Other (See Comments)    REACTION: trembling   Unisom [Doxylamine] Swelling and Rash    Outpatient Encounter Medications as of 02/25/2021  Medication Sig   acetaminophen (TYLENOL) 500 MG tablet Take 500 mg by mouth every 6 (six) hours as needed for mild pain.   amlodipine-olmesartan (AZOR) 10-20 MG tablet TAKE 1 TABLET BY MOUTH ONCE DAILY.   bimatoprost (LUMIGAN) 0.03 % ophthalmic solution Place 1 drop into both eyes at bedtime.   calcium-vitamin D (OSCAL WITH D) 500-200 MG-UNIT per  tablet Take 1 tablet by mouth daily.   citalopram (CELEXA) 10 MG tablet Take 1 tablet (10 mg total) by mouth daily.   diclofenac Sodium (VOLTAREN) 1 % GEL Apply 2 g topically 4 (four) times daily.   donepezil (ARICEPT) 10 MG tablet TAKE ONE TABLET BY MOUTH AT BEDTIME.   hydrOXYzine (ATARAX/VISTARIL) 25 MG tablet Take 1 tablet (25 mg total) by mouth daily as needed. For agitation (Patient not taking: Reported on 10/23/2020)   Magnesium 250 MG TABS Take 1 tablet by mouth with evening meal   omeprazole (PRILOSEC) 20 MG capsule TAKE (1) CAPSULE BY MOUTH ONCE DAILY.   No facility-administered encounter medications on file as of 02/25/2021.    Patient Active Problem List   Diagnosis Date Noted   Ankle fracture, bimalleolar, closed 04/03/2020   Unilateral primary osteoarthritis, right knee 05/29/2019   Gait disturbance 07/04/2018   Right knee pain 07/04/2018   Alzheimer disease ([place]) 07/04/2018   Iron deficiency 11/23/2017   Mixed hyperlipidemia 11/23/2017   Memory loss 11/23/2017   History of falling 11/23/2017   Leukocytosis 10/17/2016   Diverticulosis of large intestine with hemorrhage    Essential hypertension 09/26/2015   Hypokalemia 09/26/2015   HIP REPLACEMENT, RIGHT, HX OF 05/27/2008   VITAMIN D DEFICIENCY 05/13/2008   GERD 05/13/2008   OSTEOARTHRITIS, HIPS, BILATERAL 05/13/2008    Conditions to be addressed/monitored:  Alzheimer's Disease, HTN, Mixed Hyperlipidemia ; Limited access to caregiver  Care Plan : Social Work Care Plan  Updates made by Daneen Schick since 02/25/2021 12:00 AM     Problem: Home and Family Safety (Wellness)  Goal: Home and Family Safety Maintained   Start Date: 12/03/2020  This Visit's Progress: On track  Recent Progress: On track  Priority: High  Note:   Current Barriers:  Chronic disease management support and education needs related to  HTN, Mixed Hyperlipidemia, Alzheimer's Disease   Housing barriers - in a home that is difficult to  navigate Limited access to caregiver  Social Worker Clinical Goal(s):  patient will work with SW to identify and address any acute and/or chronic care coordination needs related to the self health management of  HTN, Mixed Hyperlipidemia, and Alzheimer's Disease    SW Interventions:  Inter-disciplinary care team collaboration (see longitudinal plan of care) Collaboration with [name], FNP regarding development and update of comprehensive plan of care as evidenced by provider attestation and co-signature Telephonic visit completed with patients niece and POA [name] to assess status of goal progression Discussed Tammy [name] spoke with a member of a local PACE program who advised the patients monthly co-pay for the service would be $1800/month which is out of budget Determined Tammy [name] has been in contact with a [facility] representative at a local senior center to assist with obtaining caregiver assistance - Tammy [name] indicates the patients primary providers office has a form for completion that will be submitted to the [place] representative Reviewed stages of dementia with Tammy [name] and mailed educational materials Determined the patient continues to have difficulty with bladder control - Tammy [name] reports she is awaiting results of a recent u/a Advised SW would reach out to patients primary care provider to request someone contact Tammy [name] to review results as well as to discuss if bladder retraining would be beneficial to the patient Scheduled follow up call over the next 45 days Collaboration with [name] FNP and [place] to advise of interventions and plan  Patient Goals/Self-Care Activities patient will: with the help of her niece [name]  -  Follow up with primary care provider regarding lab results -Engage with Mrs. Ronnald Ramp, [place] representative regarding caregiver resources -Contact SW as needed prior to next scheduled call  Follow Up  Plan:  SW will follow up with [name] over the next 45 days       Follow Up Plan: SW will follow up with patient by phone over the next 45 days.      Daneen Schick, BSW, CDP Social Worker, Certified Dementia Practitioner [place] [facility] [place] [facility] [phone]

## 2021-03-03 ENCOUNTER — Telehealth: Payer: Medicare Other

## 2021-03-03 ENCOUNTER — Ambulatory Visit: Payer: Self-pay

## 2021-03-03 DIAGNOSIS — E782 Mixed hyperlipidemia: Secondary | ICD-10-CM

## 2021-03-03 DIAGNOSIS — I10 Essential (primary) hypertension: Secondary | ICD-10-CM

## 2021-03-03 DIAGNOSIS — F0280 Dementia in other diseases classified elsewhere without behavioral disturbance: Secondary | ICD-10-CM

## 2021-03-03 DIAGNOSIS — I2510 Atherosclerotic heart disease of native coronary artery without angina pectoris: Secondary | ICD-10-CM

## 2021-03-05 ENCOUNTER — Ambulatory Visit: Payer: Medicare Other

## 2021-03-05 ENCOUNTER — Telehealth: Payer: Self-pay

## 2021-03-05 DIAGNOSIS — F0280 Dementia in other diseases classified elsewhere without behavioral disturbance: Secondary | ICD-10-CM

## 2021-03-05 DIAGNOSIS — I10 Essential (primary) hypertension: Secondary | ICD-10-CM

## 2021-03-05 DIAGNOSIS — E782 Mixed hyperlipidemia: Secondary | ICD-10-CM

## 2021-03-05 DIAGNOSIS — G301 Alzheimer's disease with late onset: Secondary | ICD-10-CM

## 2021-03-05 NOTE — Patient Instructions (Signed)
Visit Information  Thank you for taking time to visit with me today. Please don't hesitate to contact me if I can be of assistance to you before our next scheduled telephone appointment.  Following are the goals we discussed today:  (Copy and paste patient goals from clinical care plan here)  Our next appointment is by telephone on 04/24/21 at 11:15 AM  Please call the care guide team at [phone] if you need to cancel or reschedule your appointment.   If you are experiencing a Mental Health or [place] or need someone to talk to, please call 1-800-273-TALK (toll free, 24 hour hotline)   Patient verbalizes understanding of instructions and care plan provided today and agrees to view in [place]. Active MyChart status confirmed with patient.    Barb Merino, RN, BSN, CCM Care Management Coordinator [place] [facility]  Direct Phone: [phone]

## 2021-03-05 NOTE — Chronic Care Management (AMB) (Signed)
Chronic Care Management    Social Work Note  03/05/2021 Name: Tammy [name] MRN: [id number] DOB: [birth year]  Tammy [name] is a 88 y.o. year old female who is a primary care patient of Arnette Felts, FNP. The CCM team was consulted to assist the patient with chronic disease management and/or care coordination needs related to:  HTN, Mixed Hyperlipidemia, Alzheimer's Disease .   Engaged with patients niece and caregiver [name]  for follow up visit in response to provider referral for social work chronic care management and care coordination services.   Consent to Services:  The patient was given information about Chronic Care Management services, agreed to services, and gave verbal consent prior to initiation of services.  Please see initial visit note for detailed documentation.   Patient agreed to services and consent obtained.   Assessment: Review of patient past medical history, allergies, medications, and health status, including review of relevant consultants reports was performed today as part of a comprehensive evaluation and provision of chronic care management and care coordination services.     SDOH (Social Determinants of Health) assessments and interventions performed:    Advanced Directives Status: Not addressed in this encounter.  CCM Care Plan  Allergies  Allergen Reactions   Advil [Ibuprofen] Itching   Ciprofloxacin Other (See Comments)    REACTION: hives   Zolpidem Tartrate Other (See Comments)    REACTION: trembling   Unisom [Doxylamine] Swelling and Rash    Outpatient Encounter Medications as of 03/05/2021  Medication Sig   acetaminophen (TYLENOL) 500 MG tablet Take 500 mg by mouth every 6 (six) hours as needed for mild pain.   amlodipine-olmesartan (AZOR) 10-20 MG tablet TAKE 1 TABLET BY MOUTH ONCE DAILY.   bimatoprost (LUMIGAN) 0.03 % ophthalmic solution Place 1 drop into both eyes at bedtime.   calcium-vitamin D (OSCAL WITH D) 500-200 MG-UNIT per  tablet Take 1 tablet by mouth daily.   citalopram (CELEXA) 10 MG tablet Take 1 tablet (10 mg total) by mouth daily.   diclofenac Sodium (VOLTAREN) 1 % GEL Apply 2 g topically 4 (four) times daily.   donepezil (ARICEPT) 10 MG tablet TAKE ONE TABLET BY MOUTH AT BEDTIME.   hydrOXYzine (ATARAX/VISTARIL) 25 MG tablet Take 1 tablet (25 mg total) by mouth daily as needed. For agitation (Patient not taking: Reported on 10/23/2020)   Magnesium 250 MG TABS Take 1 tablet by mouth with evening meal   omeprazole (PRILOSEC) 20 MG capsule TAKE (1) CAPSULE BY MOUTH ONCE DAILY.   No facility-administered encounter medications on file as of 03/05/2021.    Patient Active Problem List   Diagnosis Date Noted   Ankle fracture, bimalleolar, closed 04/03/2020   Unilateral primary osteoarthritis, right knee 05/29/2019   Gait disturbance 07/04/2018   Right knee pain 07/04/2018   Alzheimer disease (HCC) 07/04/2018   Iron deficiency 11/23/2017   Mixed hyperlipidemia 11/23/2017   Memory loss 11/23/2017   History of falling 11/23/2017   Leukocytosis 10/17/2016   Diverticulosis of large intestine with hemorrhage    Essential hypertension 09/26/2015   Hypokalemia 09/26/2015   HIP REPLACEMENT, RIGHT, HX OF 05/27/2008   VITAMIN D DEFICIENCY 05/13/2008   GERD 05/13/2008   OSTEOARTHRITIS, HIPS, BILATERAL 05/13/2008    Conditions to be addressed/monitored: HTN, HLD, and Alzheimer's Disease ;  Accessibility into the home  Care Plan : Social Work Care Plan  Updates made by [name] since 03/05/2021 12:00 AM     Problem: Home and Family Safety (Wellness)  Goal: Obtain Resources for a Wheelchair Ramp   Start Date: 03/05/2021  Priority: High  Note:   Current Barriers:  Chronic disease management support and education needs related to HTN, HLD, and Alzheimer's Disease   Lacks knowledge of community resource: to assist with 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, 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 Clinical Goal(s):  patient will work  with SW to identify and address any acute and/or chronic care coordination needs related to the self health management of HTN, HLD, and Alzheimer's Disease   Explore community resource options to assist with installing a wheelchair ramp SW Interventions:  Inter-disciplinary care team collaboration (see longitudinal plan of care) Collaboration with Arnette Felts, FNP regarding development and update of comprehensive plan of care as evidenced by provider attestation and co-signature Collaboration with RN Care Manager Lawanna Kobus Little who indicates patients niece [name] is in need of resources for a wheelchair ramp Telephonic visit completed with [name] patients caregiver to discuss interest in obtaining a wheelchair ramp to assist in patients ability to safely enter and exit the home  Verbal education about [employer] ([facility] Rampin[employer] Up program provided to [name] Placed referral to [facility] on behalf of the patient Advised [name] she will be contacted to screen for patient eligibility SW to follow up on outcome of referral over the next month  Patient Goals/Self-Care Activities patient will: with the help of her niece  -  Engage with [place] [facility] to assess eligibility to receive a wheelchair ramp  Follow Up Plan: The care management team will reach out to the patient again over the next 30 days.        Follow Up Plan: SW will follow up with patient by phone over the next month to assess outcome of referral.      [name], BSW, CDP Social Worker, Certified Dementia Practitioner [facility] Care Management [phone]

## 2021-03-05 NOTE — Telephone Encounter (Signed)
°  Care Management   Follow Up Note   03/05/2021 Name: Tammy [name] MRN: [id number] DOB: March 09, 1933   Referred by: [name], FNP Reason for referral : Chronic Care Management (Unsuccessful call)   An unsuccessful telephone outreach was attempted today. The patient was referred to the case management team for assistance with care management and care coordination.   Follow Up Plan: The care management team will reach out to the patient again over the next 21 days.   Daneen Schick, BSW, CDP Social Worker, Certified Dementia Practitioner [place] / [place] Management [phone]

## 2021-03-05 NOTE — Patient Instructions (Signed)
Social Worker Visit Information  Goals we discussed today:  Patient Goals/Self-Care Activities patient will: with the help of her niece  - Engage with [place] [facility] to assess eligibility to receive a wheelchair ramp  Materials Provided: Verbal education about [employer] provided by phone  Patient verbalizes understanding of instructions and care plan provided today and agrees to view in [place]. Active MyChart status confirmed with patient.    Follow Up Plan: SW will follow up with patient by phone over the next month   Daneen Schick, BSW, CDP Social Worker, Certified Dementia Practitioner [place] / [place] Management [phone]

## 2021-03-05 NOTE — Chronic Care Management (AMB) (Addendum)
Chronic Care Management   CCM RN Visit Note  03/03/2021 Name: Tammy [name] MRN: [id number] DOB: [birth year]  Subjective: Tammy [name] is a 88 y.o. year old female who is a primary care patient of Arnette Felts, FNP. The care management team was consulted for assistance with disease management and care coordination needs.    Engaged with patient by telephone for follow up visit in response to provider referral for case management and/or care coordination services.   Consent to Services:  The patient was given information about Chronic Care Management services, agreed to services, and gave verbal consent prior to initiation of services.  Please see initial visit note for detailed documentation.   Patient agreed to services and verbal consent obtained.   Assessment: Review of patient past medical history, allergies, medications, health status, including review of consultants reports, laboratory and other test data, was performed as part of comprehensive evaluation and provision of chronic care management services.   SDOH (Social Determinants of Health) assessments and interventions performed:    CCM Care Plan  Allergies  Allergen Reactions   Advil [Ibuprofen] Itching   Ciprofloxacin Other (See Comments)    REACTION: hives   Zolpidem Tartrate Other (See Comments)    REACTION: trembling   Unisom [Doxylamine] Swelling and Rash    Outpatient Encounter Medications as of 03/03/2021  Medication Sig   acetaminophen (TYLENOL) 500 MG tablet Take 500 mg by mouth every 6 (six) hours as needed for mild pain.   amlodipine-olmesartan (AZOR) 10-20 MG tablet TAKE 1 TABLET BY MOUTH ONCE DAILY.   bimatoprost (LUMIGAN) 0.03 % ophthalmic solution Place 1 drop into both eyes at bedtime.   calcium-vitamin D (OSCAL WITH D) 500-200 MG-UNIT per tablet Take 1 tablet by mouth daily.   citalopram (CELEXA) 10 MG tablet Take 1 tablet (10 mg total) by mouth daily.   diclofenac Sodium (VOLTAREN) 1 % GEL Apply  2 g topically 4 (four) times daily.   donepezil (ARICEPT) 10 MG tablet TAKE ONE TABLET BY MOUTH AT BEDTIME.   hydrOXYzine (ATARAX/VISTARIL) 25 MG tablet Take 1 tablet (25 mg total) by mouth daily as needed. For agitation (Patient not taking: Reported on 10/23/2020)   Magnesium 250 MG TABS Take 1 tablet by mouth with evening meal   omeprazole (PRILOSEC) 20 MG capsule TAKE (1) CAPSULE BY MOUTH ONCE DAILY.   No facility-administered encounter medications on file as of 03/03/2021.    Patient Active Problem List   Diagnosis Date Noted   Ankle fracture, bimalleolar, closed 04/03/2020   Unilateral primary osteoarthritis, right knee 05/29/2019   Gait disturbance 07/04/2018   Right knee pain 07/04/2018   Alzheimer disease (HCC) 07/04/2018   Iron deficiency 11/23/2017   Mixed hyperlipidemia 11/23/2017   Memory loss 11/23/2017   History of falling 11/23/2017   Leukocytosis 10/17/2016   Diverticulosis of large intestine with hemorrhage    Essential hypertension 09/26/2015   Hypokalemia 09/26/2015   HIP REPLACEMENT, RIGHT, HX OF 05/27/2008   VITAMIN D DEFICIENCY 05/13/2008   GERD 05/13/2008   OSTEOARTHRITIS, HIPS, BILATERAL 05/13/2008    Conditions to be addressed/monitored: Essential hypertension, Mixed hyperlipidemia, Atherosclerotic cardiovascular disease, 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  Care Plan : RN Care Manager Plan of Care  Updates made by [name], RN since 03/03/2021 12:00 AM     Problem: No plan established for management of chronic disease states (Essential hypertension, Mixed hyperlipidemia, Atherosclerotic cardiovascular disease, 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)   Priority: High  Long-Range Goal: Establishment of plan of care for management of chronic diseases (Essential hypertension, Mixed hyperlipidemia, Atherosclerotic cardiovascular disease, 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)   Start  Date: 12/02/2020  Expected End Date: 12/02/2021  Recent Progress: On track  Priority: High  Note:   Current Barriers:  Knowledge Deficits related to plan of care for management of Essential hypertension, Mixed hyperlipidemia, Atherosclerotic cardiovascular disease, 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 Chronic Disease Management support and education needs related to Essential hypertension, Mixed hyperlipidemia, Atherosclerotic cardiovascular disease, 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 Cognitive Deficits  RNCM Clinical Goal(s):  Patient will verbalize basic understanding of  Essential hypertension, Mixed hyperlipidemia, Atherosclerotic cardiovascular disease, 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 disease process and self health management plan   take all medications exactly as prescribed and will call provider for medication related questions demonstrate Improved health management independence   continue to work with RN Care Manager to address care management and care coordination needs related to  Essential hypertension, Mixed hyperlipidemia, Atherosclerotic cardiovascular disease, 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 work with Child psychotherapist to address  related to the management of Cognitive Deficits, Memory Deficits, and Lacks knowledge of community resource: respite/custodial care related to the management of Essential hypertension, Mixed hyperlipidemia, Atherosclerotic cardiovascular disease, 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   Interventions: 1:1 collaboration with primary care provider regarding development and update of comprehensive plan of care as evidenced by provider attestation and co-signature Inter-disciplinary care team collaboration (see longitudinal plan of care) Evaluation of current treatment plan  related to  self management and patient's adherence to plan as established by provider  Dementia: (Status: Goal on track:  Yes.) Completed successful outbound call with niece [name] Evaluation of current treatment plan related to Dementia, self-management and patient's adherence to plan as established by provider. Determined niece [name] is available to provide 24/7 care for patient at this time, she receive respite care a few hours per week when hired help comes in to relieve her  Determined patient is showing signs of advanced dementia as evidence by bowel and bladder incontinence, decreased appetite and difficulty swallowing certain foods/liquids  Educated niece on basic disease process related to advanced dementia Educated on risk for aspiration and or aspirational pneumonia, educated niece on signs/symptoms and when to seek medical attention if needed Instructed niece to keep PCP well informed of any/all concerns related to symptoms suggestive of dysphagia and or aspiration Mailed printed educational material related to How to Perform Chair Exercises Discussed plans with patient for ongoing care management follow up and provided patient with direct contact information for care management team  Patient Goals/Self-Care Activities: Take all medications as prescribed Attend all scheduled provider appointments Call pharmacy for medication refills 3-7 days in advance of running out of medications Call provider office for new concerns or questions   Follow Up Plan:  Telephone follow up appointment with care management team member scheduled for:  04/24/21      Plan:Telephone follow up appointment with care management team member scheduled for:  04/24/21  Delsa Sale, RN, BSN, CCM Care Management Coordinator [facility] Care Management/[facility]  Direct Phone: [phone]

## 2021-03-12 ENCOUNTER — Other Ambulatory Visit: Payer: Self-pay | Admitting: Nurse Practitioner

## 2021-03-19 ENCOUNTER — Telehealth: Payer: Medicare Other | Admitting: Nurse Practitioner

## 2021-03-20 ENCOUNTER — Other Ambulatory Visit: Payer: Self-pay | Admitting: Nurse Practitioner

## 2021-03-20 MED ORDER — OXYBUTYNIN CHLORIDE 5 MG PO TABS: 5.0000 mg | ORAL_TABLET | Freq: Two times a day (BID) | ORAL | 1 refills | Status: DC

## 2021-03-20 NOTE — Progress Notes (Signed)
I sent oxybutynin to the pharmacy to take 2 times a day and will need a f/u in 4-6 weeks. Make sure staying well hydrated this can be virtual

## 2021-03-24 DIAGNOSIS — G301 Alzheimer's disease with late onset: Secondary | ICD-10-CM

## 2021-03-24 DIAGNOSIS — I10 Essential (primary) hypertension: Secondary | ICD-10-CM

## 2021-03-24 DIAGNOSIS — I2510 Atherosclerotic heart disease of native coronary artery without angina pectoris: Secondary | ICD-10-CM

## 2021-03-24 DIAGNOSIS — F0280 Dementia in other diseases classified elsewhere without behavioral disturbance: Secondary | ICD-10-CM

## 2021-03-24 DIAGNOSIS — E782 Mixed hyperlipidemia: Secondary | ICD-10-CM

## 2021-03-30 ENCOUNTER — Telehealth: Payer: Medicare Other

## 2021-03-30 ENCOUNTER — Telehealth: Payer: Self-pay

## 2021-03-30 NOTE — Telephone Encounter (Signed)
?  Care Management  ? ?Follow Up Note ? ? ?03/30/2021 ?Name: Tammy [name] MRN: [id number] DOB: April 20, 1933 ? ? ?Referred by: Arnette Felts, FNP ?Reason for referral : Chronic Care Management (Unsuccessful call) ? ? ?An unsuccessful telephone outreach was attempted today. The patient was referred to the case management team for assistance with care management and care coordination.  ? ?Follow Up Plan: The care management team will reach out to the patient again over the next 21 days.  ? ?[name], BSW, CDP ?Social Worker, Certified Dementia Practitioner ?[facility] / [facility] Care Management ?[phone] ? ?   ? ? ?

## 2021-04-01 ENCOUNTER — Telehealth (INDEPENDENT_AMBULATORY_CARE_PROVIDER_SITE_OTHER): Payer: Medicare Other | Admitting: Nurse Practitioner

## 2021-04-01 ENCOUNTER — Encounter: Payer: Self-pay | Admitting: Nurse Practitioner

## 2021-04-01 DIAGNOSIS — R32 Unspecified urinary incontinence: Secondary | ICD-10-CM

## 2021-04-01 DIAGNOSIS — G301 Alzheimer's disease with late onset: Secondary | ICD-10-CM | POA: Diagnosis not present

## 2021-04-01 DIAGNOSIS — W19XXXA Unspecified fall, initial encounter: Secondary | ICD-10-CM

## 2021-04-01 DIAGNOSIS — F0280 Dementia in other diseases classified elsewhere without behavioral disturbance: Secondary | ICD-10-CM | POA: Diagnosis not present

## 2021-04-01 MED ORDER — OXYBUTYNIN CHLORIDE 5 MG PO TABS: 5.0000 mg | ORAL_TABLET | Freq: Every day | ORAL | 1 refills | Status: DC

## 2021-04-01 NOTE — Progress Notes (Signed)
? ?Virtual Visit via MyChart  ? ?This visit type was conducted due to national recommendations for restrictions regarding the COVID-19 Pandemic (e.g. social distancing) in an effort to limit this patient's exposure and mitigate transmission in our community.  Due to her co-morbid illnesses, this patient is at least at moderate risk for complications without adequate follow up.  This format is felt to be most appropriate for this patient at this time.  All issues noted in this document were discussed and addressed.  A limited physical exam was performed with this format.   ? ?This visit type was conducted due to national recommendations for restrictions regarding the COVID-19 Pandemic (e.g. social distancing) in an effort to limit this patient's exposure and mitigate transmission in our community.  Patients identity confirmed using two different identifiers.  This format is felt to be most appropriate for this patient at this time.  All issues noted in this document were discussed and addressed.  No physical exam was performed (except for noted visual exam findings with Video Visits).   ? ?Date:  04/01/2021  ? ?ID:  Tammy [name], DOB 11-17-1933, MRN [id number] ? ?Patient Location:  ?Home - spoke with Jacelyn Grip and [name]  ? ?Provider location:   ?Office ? ? ? ?Chief Complaint:  follow up on oxybutynin ? ?History of Present Illness:   ? ?Tammy [name] is a 88 y.o. female who presents via video conferencing for a telehealth visit today.   ? ?The patient does not have symptoms concerning for COVID-19 infection (fever, chills, cough, or new shortness of breath).  ? ?Patient presents today for follow up oxybutynin.  She had a fall on Monday, she was getting up to go to the bathroom and had some soreness, she was given tylenol and has been effective. She has not had any swelling to her ankle, appetite has improved.  ? ?She is trying to move forward to placing her. She would be private pay due to her income  $1800.   ? ?Hypertension ?This is a chronic problem. The current episode started more than 1 year ago. The problem is unchanged. The problem is controlled. Pertinent negatives include no chest pain, palpitations or shortness of breath. Risk factors for coronary artery disease include sedentary lifestyle. The current treatment provides no improvement. There are no compliance problems.  There is no history of angina. There is no history of chronic renal disease.  ?Hyperlipidemia ?This is a chronic problem. The current episode started more than 1 year ago. The problem is controlled. She has no history of chronic renal disease. Pertinent negatives include no chest pain or shortness of breath. There are no compliance problems.   ?Fall ?Pertinent negatives include no fever.   ? ?Past Medical History:  ?Diagnosis Date  ? Acid reflux   ? Glaucoma   ? High cholesterol   ? Hypertension   ? Lower GI bleed   ? Rectal bleeding 09/26/2015  ? ?Past Surgical History:  ?Procedure Laterality Date  ? ABDOMINAL HYSTERECTOMY    ? COLONOSCOPY N/A 09/30/2015  ? Procedure: COLONOSCOPY;  Surgeon: Jeani Hawking, MD;  Location: [facility];  Service: Endoscopy;  Laterality: N/A;  ? JOINT REPLACEMENT    ? REVISION TOTAL HIP ARTHROPLASTY Bilateral   ? 2011 and 2012  ?  ? ?Current Meds  ?Medication Sig  ? acetaminophen (TYLENOL) 500 MG tablet Take 500 mg by mouth every 6 (six) hours as needed for mild pain.  ? amlodipine-olmesartan (AZOR) 10-20 MG tablet  TAKE 1 TABLET BY MOUTH ONCE DAILY.  ? bimatoprost (LUMIGAN) 0.03 % ophthalmic solution Place 1 drop into both eyes at bedtime.  ? calcium-vitamin D (OSCAL WITH D) 500-200 MG-UNIT per tablet Take 1 tablet by mouth daily.  ? citalopram (CELEXA) 10 MG tablet Take 1 tablet (10 mg total) by mouth daily.  ? diclofenac Sodium (VOLTAREN) 1 % GEL Apply 2 g topically 4 (four) times daily.  ? donepezil (ARICEPT) 10 MG tablet TAKE ONE TABLET BY MOUTH AT BEDTIME.  ? hydrOXYzine (ATARAX/VISTARIL) 25 MG tablet  Take 1 tablet (25 mg total) by mouth daily as needed. For agitation  ? Magnesium 250 MG TABS Take 1 tablet by mouth with evening meal  ? omeprazole (PRILOSEC) 20 MG capsule TAKE (1) CAPSULE BY MOUTH ONCE DAILY.  ? [DISCONTINUED] oxybutynin (DITROPAN) 5 MG tablet Take 1 tablet (5 mg total) by mouth 2 (two) times daily.  ?  ? ?Allergies:   Advil [ibuprofen], Ciprofloxacin, Zolpidem tartrate, and Unisom [doxylamine]  ? ?Social History  ? ?Tobacco Use  ? Smoking status: Never  ? Smokeless tobacco: Never  ?Vaping Use  ? Vaping Use: Never used  ?Substance Use Topics  ? Alcohol use: No  ? Drug use: No  ?  ? ?Family Hx: ?The patient's family history includes Heart disease in her brother; Hypertension in her brother, mother, and sister. ? ?ROS:   ?Please see the history of present illness.    ?Review of Systems  ?Constitutional: Negative.  Negative for fever.  ?Respiratory: Negative.  Negative for shortness of breath.   ?Cardiovascular: Negative.  Negative for chest pain and palpitations.  ?Gastrointestinal: Negative.   ?Neurological: Negative.   ?Psychiatric/Behavioral: Negative.     ?All other systems reviewed and are negative. ? ? ?Labs/Other Tests and Data Reviewed:   ? ?Recent Labs: ?02/16/2021: ALT 5; BUN 9; Creatinine, Ser 0.86; Potassium 4.6; Sodium 143  ? ?Recent Lipid Panel ?Lab Results  ?Component Value Date/Time  ? CHOL 215 (H) 02/16/2021 11:52 AM  ? TRIG 91 02/16/2021 11:52 AM  ? HDL 57 02/16/2021 11:52 AM  ? CHOLHDL 3.8 02/16/2021 11:52 AM  ? LDLCALC 142 (H) 02/16/2021 11:52 AM  ? ? ?Wt Readings from Last 3 Encounters:  ?02/16/21 141 lb 3.2 oz (64 kg)  ?10/23/20 155 lb (70.3 kg)  ?07/08/20 154 lb (69.9 kg)  ?  ? ?Exam:   ? ?Vital Signs:  There were no vitals taken for this visit.  ? ? ?Physical Exam ?Vitals reviewed.  ?Constitutional:   ?   General: She is not in acute distress. ?   Appearance: Normal appearance.  ?Neurological:  ?   Mental Status: She is alert.  ? ? ?ASSESSMENT & PLAN:   ? ?1. Late onset  Alzheimer's disease without behavioral disturbance (HCC) ?Her niece is still interested in placement, she would benefit from going to a memory care unit. I have sent a message to [place] [facility] to contact the niece.  ?- oxybutynin (DITROPAN) 5 MG tablet; Take 1 tablet (5 mg total) by mouth daily.  Dispense: 90 tablet; Refill: 1 ? ?2. Urinary incontinence, unspecified type ?Improved since taking oxybutynin but only taking once a day so I changed the order. Encouraged to increase water intake  ?- oxybutynin (DITROPAN) 5 MG tablet; Take 1 tablet (5 mg total) by mouth daily.  Dispense: 90 tablet; Refill: 1 ? ?3. Fall, initial encounter ?No injuries, discussed importance of clear areas, no throw rugs and using nonstick socks  ? ?  ? ?  COVID-19 Education: ?The signs and symptoms of COVID-19 were discussed with the patient and how to seek care for testing (follow up with PCP or arrange E-visit).  The importance of social distancing was discussed today. ? ?Patient Risk:   ?After full review of this patients clinical status, I feel that they are at least moderate risk at this time. ? ?Time:   ?Today, I have spent 9 minutes/ seconds with the patient with telehealth technology discussing above diagnoses.   ? ? ?Medication Adjustments/Labs and Tests Ordered: ?Current medicines are reviewed at length with the patient today.  Concerns regarding medicines are outlined above.  ? ?Tests Ordered: ?No orders of the defined types were placed in this encounter. ? ? ?Medication Changes: ?Meds ordered this encounter  ?Medications  ? oxybutynin (DITROPAN) 5 MG tablet  ?  Sig: Take 1 tablet (5 mg total) by mouth daily.  ?  Dispense:  90 tablet  ?  Refill:  1  ? ? ?Disposition:  Follow up prn ? ?Signed, ?Arnette Felts, FNP  ? ?

## 2021-04-01 NOTE — Patient Instructions (Signed)

## 2021-04-07 ENCOUNTER — Ambulatory Visit (INDEPENDENT_AMBULATORY_CARE_PROVIDER_SITE_OTHER): Payer: Medicare Other

## 2021-04-07 DIAGNOSIS — I10 Essential (primary) hypertension: Secondary | ICD-10-CM

## 2021-04-07 DIAGNOSIS — E782 Mixed hyperlipidemia: Secondary | ICD-10-CM

## 2021-04-07 DIAGNOSIS — F0280 Dementia in other diseases classified elsewhere without behavioral disturbance: Secondary | ICD-10-CM

## 2021-04-07 NOTE — Patient Instructions (Signed)
Social Worker Visit Information ? ?Goals we discussed today:  ? Goals Addressed   ? ?  ?  ?  ?  ? This Visit's Progress  ?  Home and Family Safety Maintained     ?  Timeframe:  Short-Term Goal ?Priority:  High ?Start Date:  11.9.22                                             ? ?Next planned outreach: 3.23.23 ? ?Patient Goals/Self-Care Activities ?patient will: with the help of her niece [name] ?-Engage with [name], [place] representative regarding caregiver resources ?-Contact SW as needed prior to next scheduled call ?  ? ?  ?  ? ?Patient verbalizes understanding of instructions and care plan provided today and agrees to view in MyChart. Active MyChart status confirmed with patient.   ? ?Follow Up Plan: SW will follow up with patient by phone over the next 10 days ? ?[name], BSW, CDP ?Social Worker, Certified Dementia Practitioner ?[facility] / [facility] Care Management ?[phone] ? ?   ? ?

## 2021-04-07 NOTE — Chronic Care Management (AMB) (Signed)
?Chronic Care Management  ? ? Social Work Note ? ?04/07/2021 ?Name: Tammy [name] MRN: [id number] DOB: Feb 09, 1933 ? ?Tammy [name] is a 88 y.o. year old female who is a primary care patient of Tammy Felts, FNP. The CCM team was consulted to assist the patient with chronic disease management and/or care coordination needs related to:  Alzheimer's Dementia, HTN, Mixed Hyperlipidemia .  ? ?Engaged with patients caregiver Tammy [name] by phone  for follow up visit in response to provider referral for social work chronic care management and care coordination services.  ? ?Consent to Services:  ?The patient was given information about Chronic Care Management services, agreed to services, and gave verbal consent prior to initiation of services.  Please see initial visit note for detailed documentation.  ? ?Patient agreed to services and consent obtained.  ? ?Assessment: Review of patient past medical history, allergies, medications, and health status, including review of relevant consultants reports was performed today as part of a comprehensive evaluation and provision of chronic care management and care coordination services.    ? ?SDOH (Social Determinants of Health) assessments and interventions performed:   ? ?Advanced Directives Status: Not addressed in this encounter. ? ?CCM Care Plan ? ?Allergies  ?Allergen Reactions  ? Advil [Ibuprofen] Itching  ? Ciprofloxacin Other (See Comments)  ?  REACTION: hives  ? Zolpidem Tartrate Other (See Comments)  ?  REACTION: trembling  ? Unisom [Doxylamine] Swelling and Rash  ? ? ?Outpatient Encounter Medications as of 04/07/2021  ?Medication Sig  ? acetaminophen (TYLENOL) 500 MG tablet Take 500 mg by mouth every 6 (six) hours as needed for mild pain.  ? amlodipine-olmesartan (AZOR) 10-20 MG tablet TAKE 1 TABLET BY MOUTH ONCE DAILY.  ? bimatoprost (LUMIGAN) 0.03 % ophthalmic solution Place 1 drop into both eyes at bedtime.  ? calcium-vitamin D (OSCAL WITH D) 500-200 MG-UNIT per  tablet Take 1 tablet by mouth daily.  ? citalopram (CELEXA) 10 MG tablet Take 1 tablet (10 mg total) by mouth daily.  ? diclofenac Sodium (VOLTAREN) 1 % GEL Apply 2 g topically 4 (four) times daily.  ? donepezil (ARICEPT) 10 MG tablet TAKE ONE TABLET BY MOUTH AT BEDTIME.  ? hydrOXYzine (ATARAX/VISTARIL) 25 MG tablet Take 1 tablet (25 mg total) by mouth daily as needed. For agitation  ? Magnesium 250 MG TABS Take 1 tablet by mouth with evening meal  ? omeprazole (PRILOSEC) 20 MG capsule TAKE (1) CAPSULE BY MOUTH ONCE DAILY.  ? oxybutynin (DITROPAN) 5 MG tablet Take 1 tablet (5 mg total) by mouth daily.  ? ?No facility-administered encounter medications on file as of 04/07/2021.  ? ? ?Patient Active Problem List  ? Diagnosis Date Noted  ? Ankle fracture, bimalleolar, closed 04/03/2020  ? Unilateral primary osteoarthritis, right knee 05/29/2019  ? Gait disturbance 07/04/2018  ? Right knee pain 07/04/2018  ? Alzheimer disease (HCC) 07/04/2018  ? Iron deficiency 11/23/2017  ? Mixed hyperlipidemia 11/23/2017  ? Memory loss 11/23/2017  ? History of falling 11/23/2017  ? Leukocytosis 10/17/2016  ? Diverticulosis of large intestine with hemorrhage   ? Essential hypertension 09/26/2015  ? Hypokalemia 09/26/2015  ? HIP REPLACEMENT, RIGHT, HX OF 05/27/2008  ? VITAMIN D DEFICIENCY 05/13/2008  ? GERD 05/13/2008  ? OSTEOARTHRITIS, HIPS, BILATERAL 05/13/2008  ? ? ?Conditions to be addressed/monitored: HTN, HLD, and Dementia; Limited access to caregiver ? ?Care Plan : Social Work Care Plan  ?Updates made by Tammy [name] since 04/07/2021 12:00 AM  ?  ? ?Problem:  Home and Family Safety (Wellness)   ?  ? ?Goal: Home and Family Safety Maintained   ?Start Date: 12/03/2020  ?Recent Progress: On track  ?Priority: High  ?Note:   ?Current Barriers:  ?Chronic disease management support and education needs related to  HTN, Mixed Hyperlipidemia, Alzheimer's Disease   ?Housing barriers - in a home that is difficult to navigate ?Limited access to  caregiver ? ?Social Worker Clinical Goal(s):  ?patient will work with SW to identify and address any acute and/or chronic care coordination needs related to the self health management of  HTN, Mixed Hyperlipidemia, and Alzheimer's Disease   ? ?SW Interventions:  ?Inter-disciplinary care team collaboration (see longitudinal plan of care) ?Collaboration with Tammy Felts, FNP regarding development and update of comprehensive plan of care as evidenced by provider attestation and co-signature ?Telephonic visit completed with patients niece and POA Tammy [name] to assess status of goal progression ?Discussed Tammy [name] fell two days ago and shattered her right elbow - the patients sister, Tammy [name] has been in the home providing assistance to the patient ?Determined Tammy [name] will plan to take the patient to her home to stay while Tammy [name] recovers ?Reviewed Tammy [name] has continued to work with the [place] as well as [facility] block grant program to obtain a caregiver in the home for the patient ?Currently, a relative named Tammy [name] is being paid out of pocket to provide assistance. Tammy [name] works for an [employer] and family is hopeful to have the block grant pay for [name] ?Discussed Tammy [name] is waiting assessment determination to identify number of hours each week the patient is approved for ?Scheduled follow up call over the next 10 days ?Patient Goals/Self-Care Activities ?patient will: with the help of her niece Tammy [name] ?-Engage with Tammy [name], [place] representative regarding caregiver resources ?-Contact SW as needed prior to next scheduled call ? ?Follow Up Plan:  SW will follow up with Tammy [name] over the next 10 days ? ?  ?  ? ?Follow Up Plan: SW will follow up with patient by phone over the next 10 days. ?     ?Tammy [name], BSW, CDP ?Social Worker, Certified Dementia Practitioner ?[facility] / [place] [facility] Care Management ?[phone] ? ?   ? ? ? ? ?

## 2021-04-09 ENCOUNTER — Encounter: Payer: Self-pay | Admitting: Nurse Practitioner

## 2021-04-09 ENCOUNTER — Other Ambulatory Visit: Payer: Self-pay

## 2021-04-16 ENCOUNTER — Other Ambulatory Visit: Payer: Self-pay | Admitting: Nurse Practitioner

## 2021-04-16 ENCOUNTER — Other Ambulatory Visit: Payer: Self-pay

## 2021-04-16 ENCOUNTER — Ambulatory Visit: Payer: Medicare Other

## 2021-04-16 DIAGNOSIS — I10 Essential (primary) hypertension: Secondary | ICD-10-CM

## 2021-04-16 DIAGNOSIS — F0280 Dementia in other diseases classified elsewhere without behavioral disturbance: Secondary | ICD-10-CM

## 2021-04-16 DIAGNOSIS — E782 Mixed hyperlipidemia: Secondary | ICD-10-CM

## 2021-04-16 DIAGNOSIS — R32 Unspecified urinary incontinence: Secondary | ICD-10-CM

## 2021-04-16 MED ORDER — HYDROXYZINE HCL 25 MG PO TABS: 25.0000 mg | ORAL_TABLET | Freq: Every day | ORAL | 2 refills | Status: DC | PRN

## 2021-04-16 MED ORDER — CITALOPRAM HYDROBROMIDE 10 MG PO TABS: 10.0000 mg | ORAL_TABLET | Freq: Every day | ORAL | 2 refills | Status: DC

## 2021-04-16 MED ORDER — AMLODIPINE-OLMESARTAN 10-20 MG PO TABS: 1.0000 | ORAL_TABLET | Freq: Every day | ORAL | 1 refills | Status: DC

## 2021-04-16 MED ORDER — OXYBUTYNIN CHLORIDE 5 MG PO TABS: 5.0000 mg | ORAL_TABLET | Freq: Every day | ORAL | 1 refills | Status: DC

## 2021-04-16 MED ORDER — DONEPEZIL HCL 10 MG PO TABS: 10.0000 mg | ORAL_TABLET | Freq: Every day | ORAL | 1 refills | Status: DC

## 2021-04-16 MED ORDER — OMEPRAZOLE 20 MG PO CPDR: 20.0000 mg | DELAYED_RELEASE_CAPSULE | Freq: Every day | ORAL | 5 refills | Status: DC

## 2021-04-16 MED ORDER — OMEPRAZOLE 20 MG PO CPDR: 20.0000 mg | DELAYED_RELEASE_CAPSULE | Freq: Every day | ORAL | 1 refills | Status: DC

## 2021-04-16 NOTE — Patient Instructions (Signed)
Social Worker Visit Information ? ?Goals we discussed today:  ?Patient Goals/Self-Care Activities ?patient will: with the help of her niece ? - Engage with [facility] as needed to receive a wheelchair ramp ?-Follow up with primary care provider as needed regarding medication needs ?-Engage with [facility] for caregiver related needs ?-Contact SW as needed prior to next scheduled call ? ? ?Patient verbalizes understanding of instructions and care plan provided today and agrees to view in MyChart. Active MyChart status confirmed with patient.   ? ?Follow Up Plan: SW will follow up with patient by phone over the next 45 days ? ?[name], BSW, CDP ?Social Worker, Certified Dementia Practitioner ?[facility] / [facility] Care Management ?[phone] ? ?   ? ?

## 2021-04-16 NOTE — Chronic Care Management (AMB) (Signed)
?Chronic Care Management  ? ? Social Work Note ? ?04/16/2021 ?Name: Tammy HillockLillie M Masi MRN: [id number] DOB: 11/19/1933 ? ?Tammy [name] is a 88 y.o. year old female who is a primary care patient of Arnette FeltsMoore, Janece, FNP. The CCM team was consulted to assist the patient with chronic disease management and/or care coordination needs related to:  HTN, Hyperlipidemia, Alzheimer's Disease .  ? ?Engaged with patients POA Cannon KettleKim Jeffries by phone  for follow up visit in response to provider referral for social work chronic care management and care coordination services.  ? ?Consent to Services:  ?The patient was given information about Chronic Care Management services, agreed to services, and gave verbal consent prior to initiation of services.  Please see initial visit note for detailed documentation.  ? ?Patient agreed to services and consent obtained.  ? ?Assessment: Review of patient past medical history, allergies, medications, and health status, including review of relevant consultants reports was performed today as part of a comprehensive evaluation and provision of chronic care management and care coordination services.    ? ?SDOH (Social Determinants of Health) assessments and interventions performed:   ? ?Advanced Directives Status: Not addressed in this encounter. ? ?CCM Care Plan ? ?Allergies  ?Allergen Reactions  ? Advil [Ibuprofen] Itching  ? Ciprofloxacin Other (See Comments)  ?  REACTION: hives  ? Zolpidem Tartrate Other (See Comments)  ?  REACTION: trembling  ? Unisom [Doxylamine] Swelling and Rash  ? ? ?Outpatient Encounter Medications as of 04/16/2021  ?Medication Sig  ? acetaminophen (TYLENOL) 500 MG tablet Take 500 mg by mouth every 6 (six) hours as needed for mild pain.  ? amlodipine-olmesartan (AZOR) 10-20 MG tablet TAKE 1 TABLET BY MOUTH ONCE DAILY.  ? bimatoprost (LUMIGAN) 0.03 % ophthalmic solution Place 1 drop into both eyes at bedtime.  ? calcium-vitamin D (OSCAL WITH D) 500-200 MG-UNIT per tablet Take 1  tablet by mouth daily.  ? citalopram (CELEXA) 10 MG tablet Take 1 tablet (10 mg total) by mouth daily.  ? diclofenac Sodium (VOLTAREN) 1 % GEL Apply 2 g topically 4 (four) times daily.  ? donepezil (ARICEPT) 10 MG tablet TAKE ONE TABLET BY MOUTH AT BEDTIME.  ? hydrOXYzine (ATARAX/VISTARIL) 25 MG tablet Take 1 tablet (25 mg total) by mouth daily as needed. For agitation  ? Magnesium 250 MG TABS Take 1 tablet by mouth with evening meal  ? oxybutynin (DITROPAN) 5 MG tablet Take 1 tablet (5 mg total) by mouth daily.  ? [DISCONTINUED] omeprazole (PRILOSEC) 20 MG capsule TAKE (1) CAPSULE BY MOUTH ONCE DAILY.  ? ?No facility-administered encounter medications on file as of 04/16/2021.  ? ? ?Patient Active Problem List  ? Diagnosis Date Noted  ? Ankle fracture, bimalleolar, closed 04/03/2020  ? Unilateral primary osteoarthritis, right knee 05/29/2019  ? Gait disturbance 07/04/2018  ? Right knee pain 07/04/2018  ? Alzheimer disease (HCC) 07/04/2018  ? Iron deficiency 11/23/2017  ? Mixed hyperlipidemia 11/23/2017  ? Memory loss 11/23/2017  ? History of falling 11/23/2017  ? Leukocytosis 10/17/2016  ? Diverticulosis of large intestine with hemorrhage   ? Essential hypertension 09/26/2015  ? Hypokalemia 09/26/2015  ? HIP REPLACEMENT, RIGHT, HX OF 05/27/2008  ? VITAMIN D DEFICIENCY 05/13/2008  ? GERD 05/13/2008  ? OSTEOARTHRITIS, HIPS, BILATERAL 05/13/2008  ? ? ?Conditions to be addressed/monitored:  HTN, Hyperlipidemia, Alzheimer's Disease ; Limited access to caregiver and Memory Deficits ? ?Care Plan : Social Work Care Plan  ?Updates made by Bevelyn NgoHumble, Dana Dorner since 04/16/2021 12:00 AM  ?  ? ?  Problem: Home and Family Safety (Wellness)   ?  ? ?Goal: Home and Family Safety Maintained   ?Start Date: 12/03/2020  ?This Visit's Progress: On track  ?Recent Progress: On track  ?Priority: High  ?Note:   ?Current Barriers:  ?Chronic disease management support and education needs related to  HTN, Mixed Hyperlipidemia, Alzheimer's Disease    ?Housing barriers - in a home that is difficult to navigate ?Limited access to caregiver ? ?Social Worker Clinical Goal(s):  ?patient will work with SW to identify and address any acute and/or chronic care coordination needs related to the self health management of  HTN, Mixed Hyperlipidemia, and Alzheimer's Disease   ? ?SW Interventions:  ?Inter-disciplinary care team collaboration (see longitudinal plan of care) ?Collaboration with Arnette Felts, FNP regarding development and update of comprehensive plan of care as evidenced by provider attestation and co-signature ?Telephonic visit completed with patients niece and POA [name] to assess for care coordination needs ?Determined the patient continues to reside with her sister Orpha Bur while [name] recovers from a broken right elbox due to recent fall ?Discussed the patient is approved for 10.5  hours of caregiver service weekly thourgh community block grant. The patients family member Consuella Lose provides this service through [employer] 3 days per week for 3.5 hours each day. Family has option to increase caregiver hours if they would like to pay out of pocket ?Determined this arrangement is working well for all involved and patient is enjoying her time in the home with Orpha Bur ?Performed chart review to note [name] sent a mychart message 3/16 to request medication refills. [name] indicates she has yet to receive refills ?Advised Mrs. Natale Lay would collaborate with patients primary care provider to request follow up ?Scheduled follow up call over the next 45 days ?Collaboration with Arnette Felts FNP to request follow up on patient medication needs ? ?Patient Goals/Self-Care Activities ?patient will: with the help of her niece [name] ?-Follow up with primary care provider as needed regarding medication needs ?-Engage with [facility] for caregiver related needs ?-Contact SW as needed prior to next scheduled call ? ?Follow Up Plan:  SW will  follow up with [name] over the next 45 days ? ?  ? ?Goal: Obtain Resources for a Wheelchair Ramp Completed 04/16/2021  ?Start Date: 03/05/2021  ?Priority: High  ?Note:   ?Current Barriers:  ?Chronic disease management support and education needs related to HTN, HLD, and Alzheimer's Disease   ?Lacks knowledge of community resource: to assist with wheelchair ramp installation ? ?Social Worker Clinical Goal(s):  ?patient will work with SW to identify and address any acute and/or chronic care coordination needs related to the self health management of HTN, HLD, and Alzheimer's Disease   ?Explore community resource options to assist with installing a wheelchair ramp ?SW Interventions:  ?Inter-disciplinary care team collaboration (see longitudinal plan of care) ?Collaboration with Arnette Felts, FNP regarding development and update of comprehensive plan of care as evidenced by provider attestation and co-signature ?Determined patient is back at her sister Katy's home and no longer in need of this resources ? ?Patient Goals/Self-Care Activities ?patient will: with the help of her niece ? -  Engage with [facility] as needed to receive a wheelchair ramp ? ? ? ?  ?  ? ?Follow Up Plan: SW will follow up with patient by phone over the next 45 days. ?     ?[name], BSW, CDP ?Social Worker, Certified Dementia Practitioner ?[facility] / [facility]  Care Management ?[phone] ? ?   ? ? ? ? ?

## 2021-04-22 ENCOUNTER — Ambulatory Visit: Payer: Medicare Other | Admitting: Nurse Practitioner

## 2021-04-24 ENCOUNTER — Telehealth: Payer: Medicare Other

## 2021-04-24 ENCOUNTER — Ambulatory Visit: Payer: Self-pay

## 2021-04-24 DIAGNOSIS — I2510 Atherosclerotic heart disease of native coronary artery without angina pectoris: Secondary | ICD-10-CM

## 2021-04-24 DIAGNOSIS — F0280 Dementia in other diseases classified elsewhere without behavioral disturbance: Secondary | ICD-10-CM

## 2021-04-24 DIAGNOSIS — E782 Mixed hyperlipidemia: Secondary | ICD-10-CM

## 2021-04-24 DIAGNOSIS — G301 Alzheimer's disease with late onset: Secondary | ICD-10-CM

## 2021-04-24 DIAGNOSIS — I10 Essential (primary) hypertension: Secondary | ICD-10-CM

## 2021-04-24 NOTE — Chronic Care Management (AMB) (Signed)
?Chronic Care Management  ? ?CCM RN Visit Note ? ?04/24/2021 ?Name: Tammy [name] MRN: [id number] DOB: 04/30/1933 ? ?Subjective: ?Tammy [name] is a 88 y.o. year old female who is a primary care patient of Arnette Felts, FNP. The care management team was consulted for assistance with disease management and care coordination needs.   ? ?Engaged with patient by telephone for follow up visit in response to provider referral for case management and/or care coordination services.  ? ?Consent to Services:  ?The patient was given information about Chronic Care Management services, agreed to services, and gave verbal consent prior to initiation of services.  Please see initial visit note for detailed documentation.  ? ?Patient agreed to services and verbal consent obtained.  ? ?Assessment: Review of patient past medical history, allergies, medications, health status, including review of consultants reports, laboratory and other test data, was performed as part of comprehensive evaluation and provision of chronic care management services.  ? ?SDOH (Social Determinants of Health) assessments and interventions performed:  Yes, no acute needs  ? ?CCM Care Plan ? ?Allergies  ?Allergen Reactions  ? Advil [Ibuprofen] Itching  ? Ciprofloxacin Other (See Comments)  ?  REACTION: hives  ? Zolpidem Tartrate Other (See Comments)  ?  REACTION: trembling  ? Unisom [Doxylamine] Swelling and Rash  ? ? ?Outpatient Encounter Medications as of 04/24/2021  ?Medication Sig  ? acetaminophen (TYLENOL) 500 MG tablet Take 500 mg by mouth every 6 (six) hours as needed for mild pain.  ? amlodipine-olmesartan (AZOR) 10-20 MG tablet Take 1 tablet by mouth daily.  ? bimatoprost (LUMIGAN) 0.03 % ophthalmic solution Place 1 drop into both eyes at bedtime.  ? calcium-vitamin D (OSCAL WITH D) 500-200 MG-UNIT per tablet Take 1 tablet by mouth daily.  ? citalopram (CELEXA) 10 MG tablet Take 1 tablet (10 mg total) by mouth daily.  ? diclofenac Sodium  (VOLTAREN) 1 % GEL Apply 2 g topically 4 (four) times daily.  ? donepezil (ARICEPT) 10 MG tablet Take 1 tablet (10 mg total) by mouth at bedtime.  ? hydrOXYzine (ATARAX) 25 MG tablet Take 1 tablet (25 mg total) by mouth daily as needed. For agitation  ? Magnesium 250 MG TABS Take 1 tablet by mouth with evening meal  ? omeprazole (PRILOSEC) 20 MG capsule Take 1 capsule (20 mg total) by mouth daily.  ? oxybutynin (DITROPAN) 5 MG tablet Take 1 tablet (5 mg total) by mouth daily.  ? ?No facility-administered encounter medications on file as of 04/24/2021.  ? ? ?Patient Active Problem List  ? Diagnosis Date Noted  ? Ankle fracture, bimalleolar, closed 04/03/2020  ? Unilateral primary osteoarthritis, right knee 05/29/2019  ? Gait disturbance 07/04/2018  ? Right knee pain 07/04/2018  ? Alzheimer disease (HCC) 07/04/2018  ? Iron deficiency 11/23/2017  ? Mixed hyperlipidemia 11/23/2017  ? Memory loss 11/23/2017  ? History of falling 11/23/2017  ? Leukocytosis 10/17/2016  ? Diverticulosis of large intestine with hemorrhage   ? Essential hypertension 09/26/2015  ? Hypokalemia 09/26/2015  ? HIP REPLACEMENT, RIGHT, HX OF 05/27/2008  ? VITAMIN D DEFICIENCY 05/13/2008  ? GERD 05/13/2008  ? OSTEOARTHRITIS, HIPS, BILATERAL 05/13/2008  ? ? 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 ?Care Plan : RN Care Manager Plan of Care  ?Updates made by [name], RN since 04/24/2021 12:00 AM  ?  ? 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  ? 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 disease process and self health management plan   ?take all medications exactly as prescribed and will call provider for medication related questions ?demonstrate Improved health management independence   ?continue to work with RN Care Manager to address care management and care coordination needs related to  Essential hypertension, Mixed hyperlipidemia, Atherosclerotic cardiovascular disease, 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  ? ?Interventions: ?1:1 collaboration with primary care provider regarding development and update of comprehensive plan of care as evidenced by provider attestation and co-signature ?Inter-disciplinary care team collaboration (see  longitudinal plan of care) ?Evaluation of current treatment plan related to  self management and patient's adherence to plan as established by provider ? ?Dementia: (Status: Goal on track:  Yes.) ?Completed successful outbound call with niece Tammy [name] ?Evaluation of current treatment plan related to Dementia, self-management and patient's adherence to plan as established by provider ?Determined patient is now residing with her sister Tammy [name] while Tammy [name] recovers from a broken right elbox due to recent fall ?Educated niece Tammy [name] regarding how personal stressors may influence or worsen mood and or agitation as part of the disease process ?Determined patient's dementia is stable, her incontinence has improved with use of Ditropan and her mood is good per niece Tammy [name] ?Discussed the family and patient are pleased with the current living and caregiver arrangements and will plan to continue the current arrangements until or unless a change is needed  ?Discussed the patient is approved for 10.5  hours of caregiver service weekly thourgh community block grant. The patients family member Tammy [name] provides this service through [employer] 3 days per week for 3.5 hours each day. Family has option to increase caregiver hours if they would like to pay out of pocket ?Encouraged Tammy [name] to keep Tammy [name]'s PCP provider well informed of new or worsening changes that may arise ?Discussed plans with patient for ongoing care management follow up and provided patient with direct contact information for care management team ? ?Patient Goals/Self-Care Activities: ?Take all medications as prescribed ?Attend all scheduled provider appointments ?Call pharmacy for medication refills 3-7 days in advance of running out of medications ?Call provider office for new concerns or questions  ? ?Follow Up Plan:  Telephone follow up appointment with care management team member scheduled for:  06/01/21 ? ?  ? ?Care Plan : Social  Work Care Plan  ?Updates made  by Riley ChurchesLittle, Keylee Shrestha L, RN since 04/24/2021 12:00 AM  ?  ? ?Problem: Home and Family Safety (Wellness)   ?  ? ?Goal: Home and Family Safety Maintained   ?Start Date: 12/03/2020  ?Recent Progress: On track  ?[facility]

## 2021-04-24 NOTE — Patient Instructions (Signed)
Visit Information ? ?Thank you for taking time to visit with me today. Please don't hesitate to contact me if I can be of assistance to you before our next scheduled telephone appointment. ? ?Following are the goals we discussed today:  ?(Copy and paste patient goals from clinical care plan here) ? ?Our next appointment is by telephone on 06/01/21 at 11:15 AM ? ?Please call the care guide team at [phone] if you need to cancel or reschedule your appointment.  ? ?If you are experiencing a Mental Health or Behavioral Health Crisis or need someone to talk to, please call 1-800-273-TALK (toll free, 24 hour hotline)  ? ?Patient verbalizes understanding of instructions and care plan provided today and agrees to view in MyChart. Active MyChart status confirmed with patient.   ? ?Delsa Sale, RN, BSN, CCM ?Care Management Coordinator ?[facility] Care Management/[facility]  ?Direct Phone: [phone] ? ? ?

## 2021-05-18 ENCOUNTER — Other Ambulatory Visit: Payer: Self-pay | Admitting: Nurse Practitioner

## 2021-05-18 DIAGNOSIS — R32 Unspecified urinary incontinence: Secondary | ICD-10-CM

## 2021-05-19 ENCOUNTER — Telehealth: Payer: Medicare Other

## 2021-05-20 ENCOUNTER — Ambulatory Visit (INDEPENDENT_AMBULATORY_CARE_PROVIDER_SITE_OTHER): Payer: Medicare Other

## 2021-05-20 DIAGNOSIS — I10 Essential (primary) hypertension: Secondary | ICD-10-CM

## 2021-05-20 DIAGNOSIS — E782 Mixed hyperlipidemia: Secondary | ICD-10-CM

## 2021-05-20 DIAGNOSIS — F0280 Dementia in other diseases classified elsewhere without behavioral disturbance: Secondary | ICD-10-CM

## 2021-05-20 NOTE — Patient Instructions (Signed)
Social Worker Visit Information ? ?Goals we discussed today:  ? Goals Addressed   ? ?  ?  ?  ?  ? This Visit's Progress  ?  COMPLETED: Home and Family Safety Maintained     ?  Timeframe:  Short-Term Goal ?Priority:  High ?Start Date:  11.9.22                                             ? ?Patient Goals/Self-Care Activities ?patient will: with the help of her niece [name] ?-Follow up with primary care provider as needed regarding health care needs ?-Engage with [facility] for caregiver related needs ?-Contact SW as needed  ?  ? ?  ?  ? ?Patient verbalizes understanding of instructions and care plan provided today and agrees to view in MyChart. Active MyChart status confirmed with patient.   ? ?Follow Up Plan:  No social work follow up planned at this time. Please contact me as needed. ? ? ?[name], BSW, CDP ?Social Worker, Certified Dementia Practitioner ?[facility] / [facility] Care Management ?[phone] ? ?   ? ?

## 2021-05-20 NOTE — Chronic Care Management (AMB) (Signed)
?Chronic Care Management  ? ? Social Work Note ? ?05/20/2021 ?Name: Tammy [name] MRN: [id number] DOB: 1933-02-06 ? ?Tammy [name] is a 88 y.o. year old female who is a primary care patient of Arnette Felts, FNP. The CCM team was consulted to assist the patient with chronic disease management and/or care coordination needs related to:  HTN, Mixed Hyperlipidemia, Alzheimer's Disease .  ? ?Engaged with patients niece and POA Tammy [name] by phone  for follow up visit in response to provider referral for social work chronic care management and care coordination services.  ? ?Consent to Services:  ?The patient was given information about Chronic Care Management services, agreed to services, and gave verbal consent prior to initiation of services.  Please see initial visit note for detailed documentation.  ? ?Patient agreed to services and consent obtained.  ? ?Assessment: Review of patient past medical history, allergies, medications, and health status, including review of relevant consultants reports was performed today as part of a comprehensive evaluation and provision of chronic care management and care coordination services.    ? ?SDOH (Social Determinants of Health) assessments and interventions performed:   ? ?Advanced Directives Status: Not addressed in this encounter. ? ?CCM Care Plan ? ?Allergies  ?Allergen Reactions  ? Advil [Ibuprofen] Itching  ? Ciprofloxacin Other (See Comments)  ?  REACTION: hives  ? Zolpidem Tartrate Other (See Comments)  ?  REACTION: trembling  ? Unisom [Doxylamine] Swelling and Rash  ? ? ?Outpatient Encounter Medications as of 05/20/2021  ?Medication Sig  ? acetaminophen (TYLENOL) 500 MG tablet Take 500 mg by mouth every 6 (six) hours as needed for mild pain.  ? amlodipine-olmesartan (AZOR) 10-20 MG tablet Take 1 tablet by mouth daily.  ? bimatoprost (LUMIGAN) 0.03 % ophthalmic solution Place 1 drop into both eyes at bedtime.  ? calcium-vitamin D (OSCAL WITH D) 500-200 MG-UNIT per  tablet Take 1 tablet by mouth daily.  ? citalopram (CELEXA) 10 MG tablet Take 1 tablet (10 mg total) by mouth daily.  ? diclofenac Sodium (VOLTAREN) 1 % GEL Apply 2 g topically 4 (four) times daily.  ? donepezil (ARICEPT) 10 MG tablet Take 1 tablet (10 mg total) by mouth at bedtime.  ? hydrOXYzine (ATARAX) 25 MG tablet Take 1 tablet (25 mg total) by mouth daily as needed. For agitation  ? Magnesium 250 MG TABS Take 1 tablet by mouth with evening meal  ? omeprazole (PRILOSEC) 20 MG capsule Take 1 capsule (20 mg total) by mouth daily.  ? oxybutynin (DITROPAN) 5 MG tablet TAKE 1 TABLET BY MOUTH ONCE DAILY.  ? ?No facility-administered encounter medications on file as of 05/20/2021.  ? ? ?Patient Active Problem List  ? Diagnosis Date Noted  ? Ankle fracture, bimalleolar, closed 04/03/2020  ? Unilateral primary osteoarthritis, right knee 05/29/2019  ? Gait disturbance 07/04/2018  ? Right knee pain 07/04/2018  ? Alzheimer disease (HCC) 07/04/2018  ? Iron deficiency 11/23/2017  ? Mixed hyperlipidemia 11/23/2017  ? Memory loss 11/23/2017  ? History of falling 11/23/2017  ? Leukocytosis 10/17/2016  ? Diverticulosis of large intestine with hemorrhage   ? Essential hypertension 09/26/2015  ? Hypokalemia 09/26/2015  ? HIP REPLACEMENT, RIGHT, HX OF 05/27/2008  ? VITAMIN D DEFICIENCY 05/13/2008  ? GERD 05/13/2008  ? OSTEOARTHRITIS, HIPS, BILATERAL 05/13/2008  ? ? ?Conditions to be addressed/monitored:  HTN, Mixed Hyperlipidemia, Alzheimer's Disease ; Limited access to caregiver ? ?Care Plan : Social Work Care Plan  ?Updates made by Tammy [name] since 05/20/2021  12:00 AM  ?Completed 05/20/2021  ? ?Problem: Home and Family Safety (Wellness) Resolved 05/20/2021  ?  ? ?Goal: Home and Family Safety Maintained Completed 05/20/2021  ?Start Date: 12/03/2020  ?Recent Progress: On track  ?Priority: High  ?Note:   ?Current Barriers:  ?Chronic disease management support and education needs related to  HTN, Mixed Hyperlipidemia, Alzheimer's  Disease   ?Housing barriers - in a home that is difficult to navigate ?Limited access to caregiver ? ?Social Worker Clinical Goal(s):  ?patient will work with SW to identify and address any acute and/or chronic care coordination needs related to the self health management of  HTN, Mixed Hyperlipidemia, and Alzheimer's Disease   ? ?SW Interventions:  ?Inter-disciplinary care team collaboration (see longitudinal plan of care) ?Collaboration with Tammy Brine, FNP regarding development and update of comprehensive plan of care as evidenced by provider attestation and co-signature ?Telephonic visit completed with patients niece and POA Tammy [name] to assess for care coordination needs ?Determined the patient continues to reside with her sister Tammy [name] and receive a caregiver in the home ?Discussed plan for [name] to contact SW as needed with future care coordination needs ? ?Patient Goals/Self-Care Activities ?patient will: with the help of her niece Tammy [name] ?-Follow up with primary care provider as needed regarding health care needs ?-Engage with [facility] for caregiver related needs ?-Contact SW as needed  ? ? ?  ?  ? ?Follow Up Plan:  No Social Work follow up planned at this time. The patient will remain engaged with RN Care Manager to address care management needs. Social Work is available as needed. ?     ?Daneen Schick, BSW, CDP ?Social Worker, Certified Dementia Practitioner ?[facility] / [place] Management ?[phone] ? ?   ? ? ? ? ?

## 2021-05-24 DIAGNOSIS — I10 Essential (primary) hypertension: Secondary | ICD-10-CM

## 2021-05-24 DIAGNOSIS — F0280 Dementia in other diseases classified elsewhere without behavioral disturbance: Secondary | ICD-10-CM

## 2021-05-24 DIAGNOSIS — E782 Mixed hyperlipidemia: Secondary | ICD-10-CM

## 2021-05-24 DIAGNOSIS — G301 Alzheimer's disease with late onset: Secondary | ICD-10-CM

## 2021-05-28 ENCOUNTER — Ambulatory Visit: Payer: Medicare Other | Admitting: Nurse Practitioner

## 2021-06-01 ENCOUNTER — Telehealth: Payer: Medicare Other

## 2021-06-01 ENCOUNTER — Telehealth: Payer: Self-pay

## 2021-06-01 NOTE — Telephone Encounter (Signed)
?  Care Management  ? ?Follow Up Note ? ? ?06/01/2021 ?Name: Tammy [name] MRN: [id number] DOB: 10-18-1933 ? ? ?Referred by: Arnette Felts, FNP ?Reason for referral : Chronic Care Management (RN CM Follow up call ) ? ? ?An unsuccessful telephone outreach was attempted today. The patient was referred to the case management team for assistance with care management and care coordination.  ? ?Follow Up Plan: A HIPPA compliant phone message was left for the patient providing contact information and requesting a return call.  ? ?Delsa Sale, RN, BSN, CCM ?Care Management Coordinator ?[facility] Care Management/[facility]  ?Direct Phone: [phone] ? ? ?

## 2021-06-17 ENCOUNTER — Other Ambulatory Visit: Payer: Self-pay | Admitting: Nurse Practitioner

## 2021-06-25 ENCOUNTER — Telehealth: Payer: Medicare Other

## 2021-06-25 ENCOUNTER — Ambulatory Visit (INDEPENDENT_AMBULATORY_CARE_PROVIDER_SITE_OTHER): Payer: Medicare Other

## 2021-06-25 DIAGNOSIS — I2510 Atherosclerotic heart disease of native coronary artery without angina pectoris: Secondary | ICD-10-CM

## 2021-06-25 DIAGNOSIS — E782 Mixed hyperlipidemia: Secondary | ICD-10-CM

## 2021-06-25 DIAGNOSIS — I10 Essential (primary) hypertension: Secondary | ICD-10-CM

## 2021-06-25 DIAGNOSIS — F0280 Dementia in other diseases classified elsewhere without behavioral disturbance: Secondary | ICD-10-CM

## 2021-06-25 NOTE — Patient Instructions (Signed)
Visit Information  Thank you for taking time to visit with me today. Please don't hesitate to contact me if I can be of assistance to you before our next scheduled telephone appointment.  Following are the goals we discussed today:  (Copy and paste patient goals from clinical care plan here)  Our next appointment is by telephone on 12/11/21 at 12:15 PM   Please call the care guide team at [phone] if you need to cancel or reschedule your appointment.   If you are experiencing a Mental Health or Behavioral Health Crisis or need someone to talk to, please call 1-800-273-TALK (toll free, 24 hour hotline)   Patient verbalizes understanding of instructions and care plan provided today and agrees to view in MyChart. Active MyChart status and patient understanding of how to access instructions and care plan via MyChart confirmed with patient.     Delsa Sale, RN, BSN, CCM Care Management Coordinator [facility] Care Management/[facility]  Direct Phone: [phone]

## 2021-06-25 NOTE — Chronic Care Management (AMB) (Signed)
Chronic Care Management   CCM RN Visit Note  06/25/2021 Name: Tammy [name] MRN: [id number] DOB: 10-29-1933  Subjective: Tammy [name] is a 88 y.o. year old female who is a primary care patient of Arnette Felts, FNP. The care management team was consulted for assistance with disease management and care coordination needs.    Engaged with patient by telephone for follow up visit in response to provider referral for case management and/or care coordination services.   Consent to Services:  The patient was given information about Chronic Care Management services, agreed to services, and gave verbal consent prior to initiation of services.  Please see initial visit note for detailed documentation.   Patient agreed to services and verbal consent obtained.   Assessment: Review of patient past medical history, allergies, medications, health status, including review of consultants reports, laboratory and other test data, was performed as part of comprehensive evaluation and provision of chronic care management services.   SDOH (Social Determinants of Health) assessments and interventions performed:  Yes, no acute changes   CCM Care Plan  Allergies  Allergen Reactions   Advil [Ibuprofen] Itching   Ciprofloxacin Other (See Comments)    REACTION: hives   Zolpidem Tartrate Other (See Comments)    REACTION: trembling   Unisom [Doxylamine] Swelling and Rash    Outpatient Encounter Medications as of 06/25/2021  Medication Sig   acetaminophen (TYLENOL) 500 MG tablet Take 500 mg by mouth every 6 (six) hours as needed for mild pain.   amlodipine-olmesartan (AZOR) 10-20 MG tablet Take 1 tablet by mouth daily.   bimatoprost (LUMIGAN) 0.03 % ophthalmic solution Place 1 drop into both eyes at bedtime.   calcium-vitamin D (OSCAL WITH D) 500-200 MG-UNIT per tablet Take 1 tablet by mouth daily.   citalopram (CELEXA) 10 MG tablet TAKE 1 TABLET BY MOUTH ONCE DAILY.   diclofenac Sodium (VOLTAREN) 1 %  GEL Apply 2 g topically 4 (four) times daily.   donepezil (ARICEPT) 10 MG tablet Take 1 tablet (10 mg total) by mouth at bedtime.   hydrOXYzine (ATARAX) 25 MG tablet Take 1 tablet (25 mg total) by mouth daily as needed. For agitation   Magnesium 250 MG TABS Take 1 tablet by mouth with evening meal   omeprazole (PRILOSEC) 20 MG capsule Take 1 capsule (20 mg total) by mouth daily.   oxybutynin (DITROPAN) 5 MG tablet TAKE 1 TABLET BY MOUTH ONCE DAILY.   No facility-administered encounter medications on file as of 06/25/2021.    Patient Active Problem List   Diagnosis Date Noted   Ankle fracture, bimalleolar, closed 04/03/2020   Unilateral primary osteoarthritis, right knee 05/29/2019   Gait disturbance 07/04/2018   Right knee pain 07/04/2018   Alzheimer disease (HCC) 07/04/2018   Iron deficiency 11/23/2017   Mixed hyperlipidemia 11/23/2017   Memory loss 11/23/2017   History of falling 11/23/2017   Leukocytosis 10/17/2016   Diverticulosis of large intestine with hemorrhage    Essential hypertension 09/26/2015   Hypokalemia 09/26/2015   HIP REPLACEMENT, RIGHT, HX OF 05/27/2008   VITAMIN D DEFICIENCY 05/13/2008   GERD 05/13/2008   OSTEOARTHRITIS, HIPS, BILATERAL 05/13/2008    Conditions to be addressed/monitored: Essential hypertension, Mixed hyperlipidemia, Atherosclerotic cardiovascular disease, 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  Care Plan : RN Care Manager Plan of Care  Updates made by [name], RN since 06/25/2021 12:00 AM     Problem: No plan established for management of chronic disease states (Essential hypertension, Mixed hyperlipidemia, Atherosclerotic  cardiovascular disease, 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)   Priority: High     Long-Range Goal: Establishment of plan of care for management of chronic diseases (Essential hypertension, Mixed hyperlipidemia, Atherosclerotic cardiovascular disease, Late onset Alzheimers  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)   Start Date: 12/02/2020  Expected End Date: 12/02/2021  Recent Progress: On track  Priority: High  Note:   Current Barriers:  Knowledge Deficits related to plan of care for management of Essential hypertension, Mixed hyperlipidemia, Atherosclerotic cardiovascular disease, 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 Chronic Disease Management support and education needs related to Essential hypertension, Mixed hyperlipidemia, Atherosclerotic cardiovascular disease, 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 Cognitive Deficits  RNCM Clinical Goal(s):  Patient will verbalize basic understanding of  Essential hypertension, Mixed hyperlipidemia, Atherosclerotic cardiovascular disease, 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 disease process and self health management plan   take all medications exactly as prescribed and will call provider for medication related questions demonstrate Improved health management independence   continue to work with RN Care Manager to address care management and care coordination needs related to  Essential hypertension, Mixed hyperlipidemia, Atherosclerotic cardiovascular disease, 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 work with Child psychotherapist to address  related to the management of Cognitive Deficits, Memory Deficits, and Lacks knowledge of community resource: respite/custodial care related to the management of Essential hypertension, Mixed hyperlipidemia, Atherosclerotic cardiovascular disease, 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   Interventions: 1:1 collaboration with primary care provider regarding development and update of comprehensive plan of care as evidenced by provider attestation and co-signature Inter-disciplinary care team collaboration (see longitudinal plan of  care) Evaluation of current treatment plan related to  self management and patient's adherence to plan as established by provider  Dementia: (Status: Goal on track:  Yes.) Completed successful outbound call with niece [name] Evaluation of current treatment plan related to Dementia, self-management and patient's adherence to plan as established by provider Determined patient's dementia is stable, her incontinence has improved with use of Ditropan and her mood is good per niece [name] Discussed the family and patient are pleased with the current living and caregiver arrangements and will plan to continue the current arrangements until or unless a change is needed  Discussed the patient is approved for 10.5  hours of caregiver service weekly th[employer]gh community block grant. The patients family member Consuella Lose provides this service through [employer] 3 days per week for 3.5 hours each day. Family has option to increase caregiver hours if they would like to pay out of pocket Encouraged [name] to keep Ms. Leffel's PCP provider well informed of new or worsening changes that may arise Discussed plans with patient for ongoing care management follow up and provided patient with direct contact information for care management team  Patient Goals/Self-Care Activities: Take all medications as prescribed Attend all scheduled provider appointments Call pharmacy for medication refills 3-7 days in advance of running out of medications Call provider office for new concerns or questions   Follow Up Plan:  Telephone follow up appointment with care management team member scheduled for:  12/11/21     Delsa Sale, RN, BSN, CCM Care Management Coordinator [facility] Care Management/[facility]  Direct Phone: [phone]

## 2021-07-13 ENCOUNTER — Other Ambulatory Visit: Payer: Self-pay | Admitting: Nurse Practitioner

## 2021-07-13 DIAGNOSIS — R32 Unspecified urinary incontinence: Secondary | ICD-10-CM

## 2021-07-14 ENCOUNTER — Other Ambulatory Visit: Payer: Self-pay | Admitting: Nurse Practitioner

## 2021-08-08 ENCOUNTER — Other Ambulatory Visit: Payer: Self-pay | Admitting: Nurse Practitioner

## 2021-08-08 DIAGNOSIS — R32 Unspecified urinary incontinence: Secondary | ICD-10-CM

## 2021-08-09 DIAGNOSIS — S5290XA Unspecified fracture of unspecified forearm, initial encounter for closed fracture: Secondary | ICD-10-CM | POA: Diagnosis not present

## 2021-08-09 DIAGNOSIS — M25539 Pain in unspecified wrist: Secondary | ICD-10-CM | POA: Diagnosis not present

## 2021-08-12 ENCOUNTER — Encounter: Payer: Self-pay | Admitting: Orthopaedic Surgery

## 2021-08-12 ENCOUNTER — Ambulatory Visit (INDEPENDENT_AMBULATORY_CARE_PROVIDER_SITE_OTHER): Payer: Medicare Other

## 2021-08-12 ENCOUNTER — Ambulatory Visit (INDEPENDENT_AMBULATORY_CARE_PROVIDER_SITE_OTHER): Payer: Medicare Other | Admitting: Orthopaedic Surgery

## 2021-08-12 DIAGNOSIS — M25532 Pain in left wrist: Secondary | ICD-10-CM | POA: Diagnosis not present

## 2021-08-12 DIAGNOSIS — S52532A Colles' fracture of left radius, initial encounter for closed fracture: Secondary | ICD-10-CM

## 2021-08-12 DIAGNOSIS — S52502A Unspecified fracture of the lower end of left radius, initial encounter for closed fracture: Secondary | ICD-10-CM | POA: Insufficient documentation

## 2021-08-12 DIAGNOSIS — I2510 Atherosclerotic heart disease of native coronary artery without angina pectoris: Secondary | ICD-10-CM | POA: Diagnosis not present

## 2021-08-12 DIAGNOSIS — M25531 Pain in right wrist: Secondary | ICD-10-CM

## 2021-08-12 NOTE — Progress Notes (Addendum)
Office Visit Note   Patient: Tammy [name]           Date of Birth: 15-Feb-1933           MRN: [id number] Visit Date: 08/12/2021              Requested by: Arnette Felts, FNP [address] [place],  [place] [address] PCP: Arnette Felts, FNP   Assessment & Plan: Visit Diagnoses:  1. Pain in right wrist   2. Closed Colles' fracture of left radius, initial encounter     Plan: [name] is accompanied by family members and seen for evaluation of left wrist pain.  She fell in her bathroom approximately 5 days ago.  She went to a local urgent care facility in [place] and was told she had a wrist fracture that she had to go to [place] for follow-up.  She has been a patient of mine in [place] and thus came to the office here in [place].  X-rays demonstrate an essentially nondisplaced fracture of the left distal radius with very minimal dorsal tilt and possibly an intra-articular extension only on the lateral film.  We will apply a short arm cast and have her follow-up with Dr. Ophelia Charter in [place] in 1 week for repeat films.  Probably in a cast for at least 4 weeks and may be a splint for 2 thereafter.  Taking over-the-counter medicines for pain  Follow-Up Instructions: No follow-ups on file.   Orders:  Orders Placed This Encounter  Procedures   XR Wrist Complete Left   No orders of the defined types were placed in this encounter.     Procedures: No procedures performed   Clinical Data: No additional findings.   Subjective: Chief Complaint  Patient presents with   Right Wrist - Follow-up   Patient presents today for her right wrist. She states that she had a fall on Saturday 08/08/2021. When fall occurred she landed on her right side causing her wrist to have pain. Patient is right hand dominant however has been having sore, tight, and numbness in her right wrist. Increased swelling is noticed more near her right thumb. She denies having any previous right wrist  surgeries and taking OTC tylenol for pain.  Review of Systems   Objective: Vital Signs: There were no vitals taken for this visit.  Physical Exam Constitutional:      Appearance: She is well-developed.  Pulmonary:     Effort: Pulmonary effort is normal.  Skin:    General: Skin is warm and dry.  Neurological:     Mental Status: She is alert and oriented to person, place, and time.  Psychiatric:        Behavior: Behavior normal.     Ortho Exam awake alert and oriented x3.  Comfortable sitting and in no acute distress.  Left wrist with some mild dorsal edema with tenderness over the distal radius.  No obvious deformity.  Skin intact.  Neurologically intact.  No swelling of the digits.  Specialty Comments:  No specialty comments available.  Imaging:  XR Wrist Complete Left  Result Date: 08/12/2021 Films of the left wrist were obtained in several projections.  There is a essentially nondisplaced distal radial metaphyseal fracture with a slight oblique component. On the lateral film there might be a dorsal intra articular extension with mild dorsal tilt  PMFS History: Patient Active Problem List   Diagnosis Date Noted   Distal radius fracture, left 08/12/2021   Ankle fracture,  bimalleolar, closed 04/03/2020   Unilateral primary osteoarthritis, right knee 05/29/2019   Gait disturbance 07/04/2018   Right knee pain 07/04/2018   Alzheimer disease (HCC) 07/04/2018   Iron deficiency 11/23/2017   Mixed hyperlipidemia 11/23/2017   Memory loss 11/23/2017   History of falling 11/23/2017   Leukocytosis 10/17/2016   Diverticulosis of large intestine with hemorrhage    Essential hypertension 09/26/2015   Hypokalemia 09/26/2015   HIP REPLACEMENT, RIGHT, HX OF 05/27/2008   VITAMIN D DEFICIENCY 05/13/2008   GERD 05/13/2008   OSTEOARTHRITIS, HIPS, BILATERAL 05/13/2008   Past Medical History:  Diagnosis Date   Acid reflux    Glaucoma    High cholesterol    Hypertension    Lower  GI bleed    Rectal bleeding 09/26/2015    Family History  Problem Relation Age of Onset   Hypertension Mother    Hypertension Sister    Hypertension Brother    Heart disease Brother     Past Surgical History:  Procedure Laterality Date   ABDOMINAL HYSTERECTOMY     COLONOSCOPY N/A 09/30/2015   Procedure: COLONOSCOPY;  Surgeon: Jeani Hawking, MD;  Location: [facility];  Service: Endoscopy;  Laterality: N/A;   JOINT REPLACEMENT     REVISION TOTAL HIP ARTHROPLASTY Bilateral    2011 and 2012   Social History   Occupational History   Occupation: Retired  Tobacco Use   Smoking status: Never   Smokeless tobacco: Never  Vaping Use   Vaping Use: Never used  Substance and Sexual Activity   Alcohol use: No   Drug use: No   Sexual activity: Not Currently

## 2021-08-13 ENCOUNTER — Ambulatory Visit: Payer: Self-pay

## 2021-08-13 DIAGNOSIS — E782 Mixed hyperlipidemia: Secondary | ICD-10-CM

## 2021-08-13 DIAGNOSIS — I10 Essential (primary) hypertension: Secondary | ICD-10-CM

## 2021-08-13 DIAGNOSIS — F0280 Dementia in other diseases classified elsewhere without behavioral disturbance: Secondary | ICD-10-CM

## 2021-08-13 DIAGNOSIS — I2510 Atherosclerotic heart disease of native coronary artery without angina pectoris: Secondary | ICD-10-CM

## 2021-08-13 NOTE — Chronic Care Management (AMB) (Signed)
Care Management    RN Visit Note  08/13/2021 Name: Tammy [name] MRN: [id number] DOB: 06-23-1933  Subjective: Tammy [name] is a 88 y.o. year old female who is a primary care patient of Tammy Felts, FNP. The care management team was consulted for assistance with disease management and care coordination needs.    Engaged with patient by telephone for follow up visit in response to provider referral for case management and/or care coordination services.   Consent to Services:   Ms. Mendonca was given information about Care Management services today including:  Care Management services includes personalized support from designated clinical staff supervised by her physician, including individualized plan of care and coordination with other care providers 24/7 contact phone numbers for assistance for urgent and routine care needs. The patient may stop case management services at any time by phone call to the office staff.  Patient agreed to services and consent obtained.   Assessment: Review of patient past medical history, allergies, medications, health status, including review of consultants reports, laboratory and other test data, was performed as part of comprehensive evaluation and provision of chronic care management services.   SDOH (Social Determinants of Health) assessments and interventions performed:  Yes, no acute needs identified   Care Plan  Allergies  Allergen Reactions   Advil [Ibuprofen] Itching   Ciprofloxacin Other (See Comments)    REACTION: hives   Zolpidem Tartrate Other (See Comments)    REACTION: trembling   Unisom [Doxylamine] Swelling and Rash    Outpatient Encounter Medications as of 08/13/2021  Medication Sig   acetaminophen (TYLENOL) 500 MG tablet Take 500 mg by mouth every 6 (six) hours as needed for mild pain.   amlodipine-olmesartan (AZOR) 10-20 MG tablet Take 1 tablet by mouth daily.   bimatoprost (LUMIGAN) 0.03 % ophthalmic solution Place 1  drop into both eyes at bedtime.   calcium-vitamin D (OSCAL WITH D) 500-200 MG-UNIT per tablet Take 1 tablet by mouth daily.   citalopram (CELEXA) 10 MG tablet TAKE 1 TABLET BY MOUTH ONCE DAILY.   diclofenac Sodium (VOLTAREN) 1 % GEL Apply 2 g topically 4 (four) times daily.   donepezil (ARICEPT) 10 MG tablet Take 1 tablet (10 mg total) by mouth at bedtime.   hydrOXYzine (ATARAX) 25 MG tablet TAKE 1 TABLET BY MOUTH ONCE DAILY AS NEEDED FOR AGITATION   Magnesium 250 MG TABS Take 1 tablet by mouth with evening meal   omeprazole (PRILOSEC) 20 MG capsule Take 1 capsule (20 mg total) by mouth daily.   oxybutynin (DITROPAN) 5 MG tablet TAKE 1 TABLET BY MOUTH ONCE DAILY.   No facility-administered encounter medications on file as of 08/13/2021.    Patient Active Problem List   Diagnosis Date Noted   Distal radius fracture, left 08/12/2021   Ankle fracture, bimalleolar, closed 04/03/2020   Unilateral primary osteoarthritis, right knee 05/29/2019   Gait disturbance 07/04/2018   Right knee pain 07/04/2018   Alzheimer disease (HCC) 07/04/2018   Iron deficiency 11/23/2017   Mixed hyperlipidemia 11/23/2017   Memory loss 11/23/2017   History of falling 11/23/2017   Leukocytosis 10/17/2016   Diverticulosis of large intestine with hemorrhage    Essential hypertension 09/26/2015   Hypokalemia 09/26/2015   HIP REPLACEMENT, RIGHT, HX OF 05/27/2008   VITAMIN D DEFICIENCY 05/13/2008   GERD 05/13/2008   OSTEOARTHRITIS, HIPS, BILATERAL 05/13/2008    Conditions to be addressed/monitored:  Essential hypertension, Mixed hyperlipidemia, Atherosclerotic cardiovascular disease, 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  Care Plan : RN Care Manager Plan of Care  Updates made by [name], RN since 08/13/2021 12:00 AM  Completed 08/13/2021   Problem: No plan established for management of chronic disease states (Essential hypertension, Mixed hyperlipidemia, Atherosclerotic cardiovascular  disease, 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) Resolved 08/13/2021  Priority: High     Long-Range Goal: Establishment of plan of care for management of chronic diseases (Essential hypertension, Mixed hyperlipidemia, Atherosclerotic cardiovascular disease, 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) Completed 08/13/2021  Start Date: 12/02/2020  Expected End Date: 12/02/2021  Recent Progress: On track  Priority: High  Note:   Current Barriers:  Knowledge Deficits related to plan of care for management of Essential hypertension, Mixed hyperlipidemia, Atherosclerotic cardiovascular disease, 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 Chronic Disease Management support and education needs related to Essential hypertension, Mixed hyperlipidemia, Atherosclerotic cardiovascular disease, 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 Cognitive Deficits  RNCM Clinical Goal(s):  Patient will verbalize basic understanding of  Essential hypertension, Mixed hyperlipidemia, Atherosclerotic cardiovascular disease, 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 disease process and self health management plan   take all medications exactly as prescribed and will call provider for medication related questions demonstrate Improved health management independence   continue to work with RN Care Manager to address care management and care coordination needs related to  Essential hypertension, Mixed hyperlipidemia, Atherosclerotic cardiovascular disease, 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   Interventions: 1:1 collaboration with primary care provider regarding development and update of comprehensive plan of care as evidenced by provider attestation and co-signature Inter-disciplinary care team collaboration (see longitudinal plan of care) Evaluation of current treatment plan related to  self management and patient's adherence to plan as established by provider  Dementia: (Status: Goal on track:  Yes.) Completed successful outbound call with niece [name] Evaluation of current treatment plan related to Dementia, self-management and patient's adherence to plan as established by provider Returned phone call to patient's niece [name] regarding an injury patient received following a fall in her home Assessed for patient/caregiver understanding of patient's prescribed treatment plan  Review of patient status, including review of consultant's reports, relevant laboratory and other test results Assessment & Plan: Visit Diagnoses:  1. Pain in right wrist   2. Closed Colles' fracture of left radius, initial encounter   Plan: Ms. Siddall is accompanied by family members and seen for evaluation of left wrist pain.  She fell in her bathroom approximately 5 days ago.  She went to a local urgent care facility in [place] and was told she had a wrist fracture that she had to go to [place] for follow-up.  She has been a patient of mine in [place] and thus came to the office here in [place].  X-rays demonstrate an essentially nondisplaced fracture of the left distal radius with very minimal dorsal tilt and possibly an intra-articular extension only on the lateral film.  We will apply a short arm cast and have her follow-up with Dr. Ophelia Charter in [place] in 1 week for repeat films.  Probably in a cast for at least 4 weeks and may be a splint for 2 thereafter.  Taking over-the-counter medicines for pain Follow-Up Instructions: No  follow-ups on file.  Orders:     Orders Placed This Encounter  Procedures   XR Wrist Complete Left  Determined niece [name] verbalizes understanding of patient's treatment plan Determined patient continues to live with her sister Florentina Addison, niece [name] will provide transportation and attend all future visits, no SDOH barriers noted at this time Educated niece [name] regarding fall precautions, instructed to call PCP to report any/all falls promptly and or to seek medical attention if needed  Discussed physical trauma may worsen dementia, mood and or increase agitation Instructed niece to report new symptoms or concerns to patient's doctor  Discussed plans with patient for ongoing care management follow up and provided patient with direct contact information for care management team  Patient Goals/Self-Care Activities: Take all medications as prescribed Attend all scheduled provider appointments Call pharmacy for medication refills 3-7 days in advance of running out of medications Call provider office for new concerns or questions  Continue to use fall precautions as discussed  Report any/all falls to PCP promptly and or seek medical attention if needed for falls/injuries   Follow Up Plan:  patient transitioned to care coordination      Delsa Sale, RN, BSN, CCM Care Management Coordinator [facility] Care Management/[facility]  Direct Phone: [phone]

## 2021-08-13 NOTE — Patient Instructions (Signed)
Visit Information  Thank you for taking time to visit with me today. Please don't hesitate to contact me if I can be of assistance to you before our next scheduled telephone appointment.  Following are the goals we discussed today:  Take all medications as prescribed Attend all scheduled provider appointments Call pharmacy for medication refills 3-7 days in advance of running out of medications Call provider office for new concerns or questions  Continue to use fall precautions as discussed  Report any/all falls to PCP promptly and or seek medical attention if needed for falls/injuries  Our next appointment is by telephone on 12/11/21 at 12:15 PM   Please call the care guide team at [phone] if you need to cancel or reschedule your appointment.   If you are experiencing a Mental Health or Behavioral Health Crisis or need someone to talk to, please call 1-800-273-TALK (toll free, 24 hour hotline)   Patient verbalizes understanding of instructions and care plan provided today and agrees to view in MyChart. Active MyChart status and patient understanding of how to access instructions and care plan via MyChart confirmed with patient.     Delsa Sale, RN, BSN, CCM Care Management Coordinator [facility]  Direct Phone: [phone]

## 2021-08-13 NOTE — Progress Notes (Signed)
This encounter was created in error - please disregard.

## 2021-08-17 ENCOUNTER — Encounter: Payer: Self-pay | Admitting: Nurse Practitioner

## 2021-08-18 ENCOUNTER — Other Ambulatory Visit: Payer: Self-pay | Admitting: Nurse Practitioner

## 2021-08-18 ENCOUNTER — Encounter: Payer: Self-pay | Admitting: Orthopaedic Surgery

## 2021-08-18 DIAGNOSIS — S62101D Fracture of unspecified carpal bone, right wrist, subsequent encounter for fracture with routine healing: Secondary | ICD-10-CM

## 2021-08-18 DIAGNOSIS — E2839 Other primary ovarian failure: Secondary | ICD-10-CM

## 2021-08-20 ENCOUNTER — Ambulatory Visit (INDEPENDENT_AMBULATORY_CARE_PROVIDER_SITE_OTHER): Payer: Medicare Other | Admitting: Orthopaedic Surgery

## 2021-08-20 ENCOUNTER — Encounter: Payer: Self-pay | Admitting: Orthopaedic Surgery

## 2021-08-20 ENCOUNTER — Ambulatory Visit (INDEPENDENT_AMBULATORY_CARE_PROVIDER_SITE_OTHER): Payer: Medicare Other

## 2021-08-20 ENCOUNTER — Telehealth: Payer: Self-pay

## 2021-08-20 DIAGNOSIS — I2510 Atherosclerotic heart disease of native coronary artery without angina pectoris: Secondary | ICD-10-CM | POA: Diagnosis not present

## 2021-08-20 DIAGNOSIS — S52532D Colles' fracture of left radius, subsequent encounter for closed fracture with routine healing: Secondary | ICD-10-CM | POA: Diagnosis not present

## 2021-08-20 NOTE — Telephone Encounter (Signed)
  Care Management   Follow Up Note   08/20/2021 Name: Tammy [name] MRN: [id number] DOB: 1933-12-25   Referred by: Arnette Felts, FNP Reason for referral : Care Coordination (Inbound call from caregiver )  Received a voice message from niece Tammy [name] requesting a call back to discuss care coordination needs for her aunt Tammy [name]. The patient was referred to the case management team for assistance with care management and care coordination needs.   Follow Up Plan: Telephone follow up appointment with care management team member scheduled for: 08/21/21  Delsa Sale, RN, BSN, CCM Care Management Coordinator [facility] Care Management/[facility]  Direct Phone: [phone]

## 2021-08-20 NOTE — Progress Notes (Signed)
Office Visit Note   Patient: Tammy [name]           Date of Birth: 1933-06-04           MRN: [id number] Visit Date: 08/20/2021              Requested by: Arnette Felts, FNP [address] [place],  [place] [address] PCP: Arnette Felts, FNP   Assessment & Plan: Visit Diagnoses:  1. Closed Colles' fracture of left radius with routine healing, subsequent encounter     Plan: Wrist splint recheck 4 weeks.  She can elevate her hand during the day and evening to help with discomfort that bothers her mostly at night.  Repeat x-rays on return.  Follow-Up Instructions: No follow-ups on file.   Orders:  Orders Placed This Encounter  Procedures   XR Wrist Complete Left   No orders of the defined types were placed in this encounter.     Procedures: No procedures performed   Clinical Data: No additional findings.   Subjective: Chief Complaint  Patient presents with   Left Wrist - Fracture    DOI 08/07/21    HPI 88 year old female here with family members follow-up left wrist fracture.  Patient saw Dr. Cleophas Dunker.  She had a cast on took it off at night when it was bothering her some.  Someone applied a small splint which is for the opposite right hand and try to stick it on her left.  She has some pain notes that she has discomfort with range of motion but has essentially nondisplaced extra-articular fracture does not involve the radiocarpal joint but fracture of the radius does extend into the radial ulnar joint.  Review of Systems noncontributory   Objective: Vital Signs: There were no vitals taken for this visit.  Physical Exam Noncontributory Ortho Exam tenderness to fracture site wrist effusion present pain with supination pronation.  No other areas of injury.  Specialty Comments:  No specialty comments available.  Imaging: No results found.   PMFS History: Patient Active Problem List   Diagnosis Date Noted   Distal radius fracture, left  08/12/2021   Ankle fracture, bimalleolar, closed 04/03/2020   Unilateral primary osteoarthritis, right knee 05/29/2019   Gait disturbance 07/04/2018   Right knee pain 07/04/2018   Alzheimer disease (HCC) 07/04/2018   Iron deficiency 11/23/2017   Mixed hyperlipidemia 11/23/2017   Memory loss 11/23/2017   History of falling 11/23/2017   Leukocytosis 10/17/2016   Diverticulosis of large intestine with hemorrhage    Essential hypertension 09/26/2015   Hypokalemia 09/26/2015   HIP REPLACEMENT, RIGHT, HX OF 05/27/2008   VITAMIN D DEFICIENCY 05/13/2008   GERD 05/13/2008   OSTEOARTHRITIS, HIPS, BILATERAL 05/13/2008   Past Medical History:  Diagnosis Date   Acid reflux    Glaucoma    High cholesterol    Hypertension    Lower GI bleed    Rectal bleeding 09/26/2015    Family History  Problem Relation Age of Onset   Hypertension Mother    Hypertension Sister    Hypertension Brother    Heart disease Brother     Past Surgical History:  Procedure Laterality Date   ABDOMINAL HYSTERECTOMY     COLONOSCOPY N/A 09/30/2015   Procedure: COLONOSCOPY;  Surgeon: Jeani Hawking, MD;  Location: [facility];  Service: Endoscopy;  Laterality: N/A;   JOINT REPLACEMENT     REVISION TOTAL HIP ARTHROPLASTY Bilateral    2011 and 2012   Social History  Occupational History   Occupation: Retired  Tobacco Use   Smoking status: Never   Smokeless tobacco: Never  Vaping Use   Vaping Use: Never used  Substance and Sexual Activity   Alcohol use: No   Drug use: No   Sexual activity: Not Currently

## 2021-08-21 ENCOUNTER — Ambulatory Visit: Payer: Self-pay

## 2021-08-21 NOTE — Progress Notes (Signed)
This encounter was created in error - please disregard.

## 2021-08-21 NOTE — Patient Instructions (Signed)
Visit Information  Thank you for taking time to visit with me today. Please don't hesitate to contact me if I can be of assistance to you.   Following are the goals we discussed today:   Goals Addressed     Patient Stated     "to have her wrist fracture heal without complications" (pt-stated)        Care Coordination Interventions: Evaluation of current treatment plan related to left wrist fracture and patient's adherence to plan as established by provider Determined patient may benefit from PT services Determined caregiver [name] is finding it difficult to get her aunt to appointments due to having her own physical limitations  Educated niece and caregiver [name] about [facility] and options for in home primary care Determined niece [name] would like to request a referral to this provider Sent in basket message to PCP provider Arnette Felts FNP requesting a referral be sent to [facility])    Our next appointment is by telephone on 09/04/21 at 02:30 PM   Please call the care guide team at [phone] if you need to cancel or reschedule your appointment.   If you are experiencing a Mental Health or Behavioral Health Crisis or need someone to talk to, please call 1-800-273-TALK (toll free, 24 hour hotline)  Patient verbalizes understanding of instructions and care plan provided today and agrees to view in MyChart. Active MyChart status and patient understanding of how to access instructions and care plan via MyChart confirmed with patient.     Delsa Sale, RN, BSN, CCM Care Management Coordinator [facility] Care Management Direct Phone: [phone]

## 2021-08-21 NOTE — Patient Outreach (Signed)
  Care Coordination   Follow Up Visit Note   08/21/2021 Name: DAWNA JAKES MRN: [id number] DOB: [birth year]  JOAQUINA NISSEN is a 88 y.o. year old female who sees Arnette Felts, FNP for primary care. I spoke with  Darrelyn Hillock by phone today  What matters to the patients health and wellness today?  Patient's caregiver would like assistance with locating a primary home care provider for patient.    Goals Addressed     Patient Stated     "to have her wrist fracture heal without complications" (pt-stated)        Care Coordination Interventions: Evaluation of current treatment plan related to left wrist fracture and patient's adherence to plan as established by provider Determined patient may benefit from PT services Determined caregiver [name] is finding it difficult to get her aunt to appointments due to having her own physical limitations  Educated niece and caregiver [name] about [facility] and options for in home primary care Determined niece [name] would like to request a referral to this provider Sent in basket message to PCP provider Arnette Felts FNP requesting a referral be sent to [facility]    SDOH assessments and interventions completed:   Yes   Care Coordination Interventions Activated:  Yes Care Coordination Interventions:  Yes, provided  Follow up plan: Follow up call scheduled for 09/04/21 @2 :30 PM   Encounter Outcome:  Pt. Visit Completed

## 2021-09-11 ENCOUNTER — Ambulatory Visit: Payer: Self-pay

## 2021-09-11 NOTE — Patient Instructions (Signed)
Visit Information  Thank you for taking time to visit with me today. Please don't hesitate to contact me if I can be of assistance to you.   Following are the goals we discussed today:   Goals Addressed       Patient Stated     "to have her wrist fracture heal without complications" (pt-stated)        Care Coordination Interventions: Evaluation of current treatment plan related to left wrist fracture and patient's adherence to plan as established by provider Determined niece [name] feels her aunt's wrist is healing without complications Discussed  next scheduled follow up with Dr. Ophelia Charter, Orthopedic MD is set for 09/17/21 Placed outbound call to [facility] spoke with Herbert Seta, determined this provider will not be able to accept patient at this time due to no staff being available for patient's location Notified niece [name] of care status with [facility] and advised this provider will each out if staffing become available  Reviewed next scheduled visit with PCP    Our next appointment is by telephone on 12/11/21 at 12:15 PM   Please call the care guide team at [phone] if you need to cancel or reschedule your appointment.   If you are experiencing a Mental Health or Behavioral Health Crisis or need someone to talk to, please call 1-800-273-TALK (toll free, 24 hour hotline)  Patient verbalizes understanding of instructions and care plan provided today and agrees to view in MyChart. Active MyChart status and patient understanding of how to access instructions and care plan via MyChart confirmed with patient.     Delsa Sale, RN, BSN, CCM Care Management Coordinator [facility] Care Management  Direct Phone: [phone]

## 2021-09-11 NOTE — Patient Outreach (Signed)
  Care Coordination   Follow Up Visit Note   09/11/2021 Name: Tammy [name] MRN: [id number] DOB: 1933-04-30  Tammy [name] is a 88 y.o. year old female who sees Arnette Felts, FNP for primary care. I spoke with  Darrelyn Hillock by phone today  What matters to the patients health and wellness today?  Niece [name] would like for the patient's wrist to heal without complications.     Goals Addressed       Patient Stated     "to have her wrist fracture heal without complications" (pt-stated)        Care Coordination Interventions: Evaluation of current treatment plan related to left wrist fracture and patient's adherence to plan as established by provider Determined niece [name] feels her aunt's wrist is healing without complications Discussed  next scheduled follow up with Dr. Ophelia Charter, Orthopedic MD is set for 09/17/21 Placed outbound call to [facility] spoke with Herbert Seta, determined this provider will not be able to accept patient at this time due to no staff being available for patient's location Notified niece [name] of care status with [facility] and advised this provider will each out if staffing become available  Reviewed next scheduled visit with PCP    SDOH assessments and interventions completed:  Yes     Care Coordination Interventions Activated:  Yes  Care Coordination Interventions:  Yes, provided   Follow up plan: Follow up call scheduled for 12/11/21 @  12:15 PM   Encounter Outcome:  Pt. Visit Completed

## 2021-09-17 ENCOUNTER — Encounter: Payer: Self-pay | Admitting: Orthopaedic Surgery

## 2021-09-17 ENCOUNTER — Ambulatory Visit (INDEPENDENT_AMBULATORY_CARE_PROVIDER_SITE_OTHER): Payer: Medicare Other | Admitting: Orthopaedic Surgery

## 2021-09-17 ENCOUNTER — Ambulatory Visit: Payer: Self-pay

## 2021-09-17 VITALS — Ht 67.0 in | Wt 141.0 lb

## 2021-09-17 DIAGNOSIS — S52532D Colles' fracture of left radius, subsequent encounter for closed fracture with routine healing: Secondary | ICD-10-CM

## 2021-09-17 DIAGNOSIS — M1711 Unilateral primary osteoarthritis, right knee: Secondary | ICD-10-CM

## 2021-09-17 DIAGNOSIS — I2510 Atherosclerotic heart disease of native coronary artery without angina pectoris: Secondary | ICD-10-CM | POA: Diagnosis not present

## 2021-09-17 NOTE — Progress Notes (Signed)
Office Visit Note   Patient: Tammy [name]           Date of Birth: 29-Oct-1933           MRN: [id number] Visit Date: 09/17/2021              Requested by: Arnette Felts, FNP [address] [place],  [place] [address] PCP: Arnette Felts, FNP   Assessment & Plan: Visit Diagnoses:  1. Closed Colles' fracture of left radius with routine healing, subsequent encounter   2. Unilateral primary osteoarthritis, right knee     Plan: We will order some home help physical therapy for knee strengthening working on getting from sitting standing on her own ambulation with a walker.  She had a history of falls and of course with the wrist fracture she does need therapy for balance and fall prevention.  Her daughters will call for follow-up if needed.  Follow-Up Instructions: Return if symptoms worsen or fail to improve.   Orders:  Orders Placed This Encounter  Procedures   XR Wrist Complete Left   Ambulatory referral to Home Health   No orders of the defined types were placed in this encounter.     Procedures: No procedures performed   Clinical Data: No additional findings.   Subjective: Chief Complaint  Patient presents with   Left Wrist - Fracture, Follow-up    DOI 08/07/2021    HPI follow-up follow-up distal radius fracture date of injury 08/07/2021 left wrist.  She has a splint she uses occasionally.  She has had problems with knee osteoarthritis and daughter is here requesting restarting home health to do work on strengthening for her legs that she can get from sitting to standing again which previously worked a few years ago that Dr. Cleophas Dunker ordered.  She denies pain in her wrist she has noticed a little bit warm at the fracture site and if she uses a lot it slightly sore.  Review of Systems Updated unchanged.  Of note is right knee osteoarthritis.  Objective: Vital Signs: Ht 5\' 7"  (1.702 m)   Wt 141 lb (64 kg)   BMI 22.08 kg/m   Physical  Exam Constitutional:      Appearance: She is well-developed.  HENT:     Head: Normocephalic.     Right Ear: External ear normal.     Left Ear: External ear normal. There is no impacted cerumen.  Eyes:     Pupils: Pupils are equal, round, and reactive to light.  Neck:     Thyroid: No thyromegaly.     Trachea: No tracheal deviation.  Cardiovascular:     Rate and Rhythm: Normal rate.  Pulmonary:     Effort: Pulmonary effort is normal.  Abdominal:     Palpations: Abdomen is soft.  Musculoskeletal:     Cervical back: No rigidity.  Skin:    General: Skin is warm and dry.  Neurological:     Mental Status: She is alert and oriented to person, place, and time.  Psychiatric:        Behavior: Behavior normal.     Ortho Exam for percent range of motion left wrist with minimal discomfort.  Right knee with crepitus.  Bilateral quad weakness.  Specialty Comments:  No specialty comments available.  Imaging: No results found.   PMFS History: Patient Active Problem List   Diagnosis Date Noted   Distal radius fracture, left 08/12/2021   Ankle fracture, bimalleolar, closed 04/03/2020   Unilateral primary  osteoarthritis, right knee 05/29/2019   Gait disturbance 07/04/2018   Right knee pain 07/04/2018   Alzheimer disease (HCC) 07/04/2018   Iron deficiency 11/23/2017   Mixed hyperlipidemia 11/23/2017   Memory loss 11/23/2017   History of falling 11/23/2017   Leukocytosis 10/17/2016   Diverticulosis of large intestine with hemorrhage    Essential hypertension 09/26/2015   Hypokalemia 09/26/2015   HIP REPLACEMENT, RIGHT, HX OF 05/27/2008   VITAMIN D DEFICIENCY 05/13/2008   GERD 05/13/2008   OSTEOARTHRITIS, HIPS, BILATERAL 05/13/2008   Past Medical History:  Diagnosis Date   Acid reflux    Glaucoma    High cholesterol    Hypertension    Lower GI bleed    Rectal bleeding 09/26/2015    Family History  Problem Relation Age of Onset   Hypertension Mother    Hypertension Sister     Hypertension Brother    Heart disease Brother     Past Surgical History:  Procedure Laterality Date   ABDOMINAL HYSTERECTOMY     COLONOSCOPY N/A 09/30/2015   Procedure: COLONOSCOPY;  Surgeon: Jeani Hawking, MD;  Location: [facility];  Service: Endoscopy;  Laterality: N/A;   JOINT REPLACEMENT     REVISION TOTAL HIP ARTHROPLASTY Bilateral    2011 and 2012   Social History   Occupational History   Occupation: Retired  Tobacco Use   Smoking status: Never   Smokeless tobacco: Never  Vaping Use   Vaping Use: Never used  Substance and Sexual Activity   Alcohol use: No   Drug use: No   Sexual activity: Not Currently

## 2021-09-22 DIAGNOSIS — H409 Unspecified glaucoma: Secondary | ICD-10-CM | POA: Diagnosis not present

## 2021-09-22 DIAGNOSIS — E611 Iron deficiency: Secondary | ICD-10-CM | POA: Diagnosis not present

## 2021-09-22 DIAGNOSIS — G309 Alzheimer's disease, unspecified: Secondary | ICD-10-CM | POA: Diagnosis not present

## 2021-09-22 DIAGNOSIS — K219 Gastro-esophageal reflux disease without esophagitis: Secondary | ICD-10-CM | POA: Diagnosis not present

## 2021-09-22 DIAGNOSIS — E782 Mixed hyperlipidemia: Secondary | ICD-10-CM | POA: Diagnosis not present

## 2021-09-22 DIAGNOSIS — Z9181 History of falling: Secondary | ICD-10-CM | POA: Diagnosis not present

## 2021-09-22 DIAGNOSIS — I10 Essential (primary) hypertension: Secondary | ICD-10-CM | POA: Diagnosis not present

## 2021-09-22 DIAGNOSIS — R269 Unspecified abnormalities of gait and mobility: Secondary | ICD-10-CM | POA: Diagnosis not present

## 2021-09-22 DIAGNOSIS — E559 Vitamin D deficiency, unspecified: Secondary | ICD-10-CM | POA: Diagnosis not present

## 2021-09-22 DIAGNOSIS — S52532D Colles' fracture of left radius, subsequent encounter for closed fracture with routine healing: Secondary | ICD-10-CM | POA: Diagnosis not present

## 2021-09-22 DIAGNOSIS — M1711 Unilateral primary osteoarthritis, right knee: Secondary | ICD-10-CM | POA: Diagnosis not present

## 2021-09-25 DIAGNOSIS — E782 Mixed hyperlipidemia: Secondary | ICD-10-CM | POA: Diagnosis not present

## 2021-09-25 DIAGNOSIS — M1711 Unilateral primary osteoarthritis, right knee: Secondary | ICD-10-CM | POA: Diagnosis not present

## 2021-09-25 DIAGNOSIS — I10 Essential (primary) hypertension: Secondary | ICD-10-CM | POA: Diagnosis not present

## 2021-09-25 DIAGNOSIS — G309 Alzheimer's disease, unspecified: Secondary | ICD-10-CM | POA: Diagnosis not present

## 2021-09-25 DIAGNOSIS — S52532D Colles' fracture of left radius, subsequent encounter for closed fracture with routine healing: Secondary | ICD-10-CM | POA: Diagnosis not present

## 2021-09-25 DIAGNOSIS — R269 Unspecified abnormalities of gait and mobility: Secondary | ICD-10-CM | POA: Diagnosis not present

## 2021-09-29 DIAGNOSIS — E782 Mixed hyperlipidemia: Secondary | ICD-10-CM | POA: Diagnosis not present

## 2021-09-29 DIAGNOSIS — G309 Alzheimer's disease, unspecified: Secondary | ICD-10-CM | POA: Diagnosis not present

## 2021-09-29 DIAGNOSIS — R269 Unspecified abnormalities of gait and mobility: Secondary | ICD-10-CM | POA: Diagnosis not present

## 2021-09-29 DIAGNOSIS — M1711 Unilateral primary osteoarthritis, right knee: Secondary | ICD-10-CM | POA: Diagnosis not present

## 2021-09-29 DIAGNOSIS — I10 Essential (primary) hypertension: Secondary | ICD-10-CM | POA: Diagnosis not present

## 2021-09-29 DIAGNOSIS — S52532D Colles' fracture of left radius, subsequent encounter for closed fracture with routine healing: Secondary | ICD-10-CM | POA: Diagnosis not present

## 2021-10-05 ENCOUNTER — Other Ambulatory Visit: Payer: Self-pay

## 2021-10-05 MED ORDER — DONEPEZIL HCL 10 MG PO TABS: 10.0000 mg | ORAL_TABLET | Freq: Every day | ORAL | 1 refills | Status: DC

## 2021-10-07 DIAGNOSIS — R269 Unspecified abnormalities of gait and mobility: Secondary | ICD-10-CM | POA: Diagnosis not present

## 2021-10-07 DIAGNOSIS — M1711 Unilateral primary osteoarthritis, right knee: Secondary | ICD-10-CM | POA: Diagnosis not present

## 2021-10-07 DIAGNOSIS — G309 Alzheimer's disease, unspecified: Secondary | ICD-10-CM | POA: Diagnosis not present

## 2021-10-07 DIAGNOSIS — I10 Essential (primary) hypertension: Secondary | ICD-10-CM | POA: Diagnosis not present

## 2021-10-07 DIAGNOSIS — S52532D Colles' fracture of left radius, subsequent encounter for closed fracture with routine healing: Secondary | ICD-10-CM | POA: Diagnosis not present

## 2021-10-07 DIAGNOSIS — E782 Mixed hyperlipidemia: Secondary | ICD-10-CM | POA: Diagnosis not present

## 2021-10-08 DIAGNOSIS — M1711 Unilateral primary osteoarthritis, right knee: Secondary | ICD-10-CM | POA: Diagnosis not present

## 2021-10-08 DIAGNOSIS — S52532D Colles' fracture of left radius, subsequent encounter for closed fracture with routine healing: Secondary | ICD-10-CM | POA: Diagnosis not present

## 2021-10-08 DIAGNOSIS — I10 Essential (primary) hypertension: Secondary | ICD-10-CM | POA: Diagnosis not present

## 2021-10-08 DIAGNOSIS — G309 Alzheimer's disease, unspecified: Secondary | ICD-10-CM | POA: Diagnosis not present

## 2021-10-08 DIAGNOSIS — E782 Mixed hyperlipidemia: Secondary | ICD-10-CM | POA: Diagnosis not present

## 2021-10-08 DIAGNOSIS — R269 Unspecified abnormalities of gait and mobility: Secondary | ICD-10-CM | POA: Diagnosis not present

## 2021-10-14 DIAGNOSIS — E782 Mixed hyperlipidemia: Secondary | ICD-10-CM | POA: Diagnosis not present

## 2021-10-14 DIAGNOSIS — I10 Essential (primary) hypertension: Secondary | ICD-10-CM | POA: Diagnosis not present

## 2021-10-14 DIAGNOSIS — G309 Alzheimer's disease, unspecified: Secondary | ICD-10-CM | POA: Diagnosis not present

## 2021-10-14 DIAGNOSIS — R269 Unspecified abnormalities of gait and mobility: Secondary | ICD-10-CM | POA: Diagnosis not present

## 2021-10-14 DIAGNOSIS — M1711 Unilateral primary osteoarthritis, right knee: Secondary | ICD-10-CM | POA: Diagnosis not present

## 2021-10-14 DIAGNOSIS — S52532D Colles' fracture of left radius, subsequent encounter for closed fracture with routine healing: Secondary | ICD-10-CM | POA: Diagnosis not present

## 2021-10-15 DIAGNOSIS — I10 Essential (primary) hypertension: Secondary | ICD-10-CM | POA: Diagnosis not present

## 2021-10-15 DIAGNOSIS — R269 Unspecified abnormalities of gait and mobility: Secondary | ICD-10-CM | POA: Diagnosis not present

## 2021-10-15 DIAGNOSIS — S52532D Colles' fracture of left radius, subsequent encounter for closed fracture with routine healing: Secondary | ICD-10-CM | POA: Diagnosis not present

## 2021-10-15 DIAGNOSIS — M1711 Unilateral primary osteoarthritis, right knee: Secondary | ICD-10-CM | POA: Diagnosis not present

## 2021-10-15 DIAGNOSIS — E782 Mixed hyperlipidemia: Secondary | ICD-10-CM | POA: Diagnosis not present

## 2021-10-15 DIAGNOSIS — G309 Alzheimer's disease, unspecified: Secondary | ICD-10-CM | POA: Diagnosis not present

## 2021-10-21 DIAGNOSIS — E782 Mixed hyperlipidemia: Secondary | ICD-10-CM | POA: Diagnosis not present

## 2021-10-21 DIAGNOSIS — M1711 Unilateral primary osteoarthritis, right knee: Secondary | ICD-10-CM | POA: Diagnosis not present

## 2021-10-21 DIAGNOSIS — S52532D Colles' fracture of left radius, subsequent encounter for closed fracture with routine healing: Secondary | ICD-10-CM | POA: Diagnosis not present

## 2021-10-21 DIAGNOSIS — I10 Essential (primary) hypertension: Secondary | ICD-10-CM | POA: Diagnosis not present

## 2021-10-21 DIAGNOSIS — G309 Alzheimer's disease, unspecified: Secondary | ICD-10-CM | POA: Diagnosis not present

## 2021-10-21 DIAGNOSIS — R269 Unspecified abnormalities of gait and mobility: Secondary | ICD-10-CM | POA: Diagnosis not present

## 2021-10-22 DIAGNOSIS — I10 Essential (primary) hypertension: Secondary | ICD-10-CM | POA: Diagnosis not present

## 2021-10-22 DIAGNOSIS — E611 Iron deficiency: Secondary | ICD-10-CM | POA: Diagnosis not present

## 2021-10-22 DIAGNOSIS — E559 Vitamin D deficiency, unspecified: Secondary | ICD-10-CM | POA: Diagnosis not present

## 2021-10-22 DIAGNOSIS — M1711 Unilateral primary osteoarthritis, right knee: Secondary | ICD-10-CM | POA: Diagnosis not present

## 2021-10-22 DIAGNOSIS — S52532D Colles' fracture of left radius, subsequent encounter for closed fracture with routine healing: Secondary | ICD-10-CM | POA: Diagnosis not present

## 2021-10-22 DIAGNOSIS — Z9181 History of falling: Secondary | ICD-10-CM | POA: Diagnosis not present

## 2021-10-22 DIAGNOSIS — K219 Gastro-esophageal reflux disease without esophagitis: Secondary | ICD-10-CM | POA: Diagnosis not present

## 2021-10-22 DIAGNOSIS — H409 Unspecified glaucoma: Secondary | ICD-10-CM | POA: Diagnosis not present

## 2021-10-22 DIAGNOSIS — E782 Mixed hyperlipidemia: Secondary | ICD-10-CM | POA: Diagnosis not present

## 2021-10-22 DIAGNOSIS — G309 Alzheimer's disease, unspecified: Secondary | ICD-10-CM | POA: Diagnosis not present

## 2021-10-22 DIAGNOSIS — R269 Unspecified abnormalities of gait and mobility: Secondary | ICD-10-CM | POA: Diagnosis not present

## 2021-10-28 DIAGNOSIS — S52532D Colles' fracture of left radius, subsequent encounter for closed fracture with routine healing: Secondary | ICD-10-CM | POA: Diagnosis not present

## 2021-10-28 DIAGNOSIS — E782 Mixed hyperlipidemia: Secondary | ICD-10-CM | POA: Diagnosis not present

## 2021-10-28 DIAGNOSIS — I10 Essential (primary) hypertension: Secondary | ICD-10-CM | POA: Diagnosis not present

## 2021-10-28 DIAGNOSIS — G309 Alzheimer's disease, unspecified: Secondary | ICD-10-CM | POA: Diagnosis not present

## 2021-10-28 DIAGNOSIS — M1711 Unilateral primary osteoarthritis, right knee: Secondary | ICD-10-CM | POA: Diagnosis not present

## 2021-10-28 DIAGNOSIS — R269 Unspecified abnormalities of gait and mobility: Secondary | ICD-10-CM | POA: Diagnosis not present

## 2021-11-02 ENCOUNTER — Other Ambulatory Visit: Payer: Self-pay | Admitting: Nurse Practitioner

## 2021-11-03 ENCOUNTER — Encounter: Payer: Self-pay | Admitting: Nurse Practitioner

## 2021-11-03 MED ORDER — CITALOPRAM HYDROBROMIDE 10 MG PO TABS: 10.0000 mg | ORAL_TABLET | Freq: Every day | ORAL | 0 refills | Status: DC

## 2021-11-04 ENCOUNTER — Other Ambulatory Visit: Payer: Self-pay

## 2021-11-04 ENCOUNTER — Encounter: Payer: Self-pay | Admitting: Nurse Practitioner

## 2021-11-04 ENCOUNTER — Ambulatory Visit (INDEPENDENT_AMBULATORY_CARE_PROVIDER_SITE_OTHER): Payer: Medicare Other | Admitting: Nurse Practitioner

## 2021-11-04 VITALS — BP 128/70 | HR 63 | Temp 98.4°F

## 2021-11-04 DIAGNOSIS — I10 Essential (primary) hypertension: Secondary | ICD-10-CM

## 2021-11-04 DIAGNOSIS — R32 Unspecified urinary incontinence: Secondary | ICD-10-CM

## 2021-11-04 DIAGNOSIS — I119 Hypertensive heart disease without heart failure: Secondary | ICD-10-CM | POA: Diagnosis not present

## 2021-11-04 DIAGNOSIS — E782 Mixed hyperlipidemia: Secondary | ICD-10-CM

## 2021-11-04 DIAGNOSIS — I2510 Atherosclerotic heart disease of native coronary artery without angina pectoris: Secondary | ICD-10-CM | POA: Diagnosis not present

## 2021-11-04 DIAGNOSIS — R450 Nervousness: Secondary | ICD-10-CM

## 2021-11-04 LAB — LIPID PANEL
Chol/HDL Ratio: 2.9 ratio (ref 0.0–4.4)
Cholesterol, Total: 213 mg/dL — ABNORMAL HIGH (ref 100–199)
HDL: 74 mg/dL (ref >39)
LDL Chol Calc (NIH): 127 mg/dL — ABNORMAL HIGH (ref 0–99)
Triglycerides: 67 mg/dL (ref 0–149)
VLDL Cholesterol Cal: 12 mg/dL (ref 5–40)

## 2021-11-04 MED ORDER — CITALOPRAM HYDROBROMIDE 10 MG PO TABS: 10.0000 mg | ORAL_TABLET | Freq: Every day | ORAL | 1 refills | Status: DC

## 2021-11-04 MED ORDER — ACETAMINOPHEN 500 MG PO TABS: 500.0000 mg | ORAL_TABLET | Freq: Four times a day (QID) | ORAL | 1 refills | Status: DC | PRN

## 2021-11-04 MED ORDER — MAGNESIUM 250 MG PO TABS: ORAL_TABLET | ORAL | 1 refills | Status: DC

## 2021-11-04 MED ORDER — ATORVASTATIN CALCIUM 10 MG PO TABS: ORAL_TABLET | ORAL | 1 refills | Status: DC

## 2021-11-04 MED ORDER — OXYBUTYNIN CHLORIDE 5 MG PO TABS: 5.0000 mg | ORAL_TABLET | Freq: Every day | ORAL | 1 refills | Status: DC

## 2021-11-04 MED ORDER — DONEPEZIL HCL 10 MG PO TABS: 10.0000 mg | ORAL_TABLET | Freq: Every day | ORAL | 1 refills | Status: DC

## 2021-11-04 MED ORDER — AMLODIPINE-OLMESARTAN 10-20 MG PO TABS: 1.0000 | ORAL_TABLET | Freq: Every day | ORAL | 1 refills | Status: DC

## 2021-11-04 MED ORDER — OMEPRAZOLE 20 MG PO CPDR: 20.0000 mg | DELAYED_RELEASE_CAPSULE | Freq: Every day | ORAL | 1 refills | Status: DC

## 2021-11-04 MED ORDER — HYDROXYZINE HCL 25 MG PO TABS: ORAL_TABLET | ORAL | 0 refills | Status: DC

## 2021-11-04 NOTE — Progress Notes (Signed)
I,Tianna Badgett,acting as a Education administrator for [employer], FNP.,have documented all relevant documentation on the behalf of [name], FNP,as directed by  [name], FNP while in the presence of [name], [place].  Subjective:     Patient ID: Tammy [name] , female    DOB: 27-May-1933 , 88 y.o.   MRN: [id number]   Chief Complaint  Patient presents with   Hypertension    HPI  Patient presents today for a blood pressure and cholesterol check.  Pt would like to wait on vaccines since she received her Moderna Booster yesterday.  She is in her last week of PT. She needs a regular walker due to how she walks. No further falls since fracturing her left wrist  Wt Readings from Last 3 Encounters: 09/17/21 : 141 lb (64 kg) 02/16/21 : 141 lb 3.2 oz (64 kg) 10/23/20 : 155 lb (70.3 kg)    Hypertension This is a chronic problem. The current episode started more than 1 year ago. The problem is unchanged. The problem is controlled. Pertinent negatives include no chest pain, palpitations or shortness of breath. Risk factors for coronary artery disease include sedentary lifestyle. The current treatment provides no improvement. There are no compliance problems.  There is no history of angina. There is no history of chronic renal disease.  Hyperlipidemia This is a chronic problem. The current episode started more than 1 year ago. The problem is controlled. She has no history of chronic renal disease. Pertinent negatives include no chest pain or shortness of breath. There are no compliance problems.      Past Medical History:  Diagnosis Date   Acid reflux    Glaucoma    High cholesterol    Hypertension    Lower GI bleed    Rectal bleeding 09/26/2015     Family History  Problem Relation Age of Onset   Hypertension Mother    Hypertension Sister    Hypertension Brother    Heart disease Brother      Current Outpatient Medications:    acetaminophen (TYLENOL) 500 MG tablet, Take 500 mg by  mouth every 6 (six) hours as needed for mild pain., Disp: , Rfl:    atorvastatin (LIPITOR) 10 MG tablet, Take 1 tab by mouth at bedtime MWF, Disp: 45 tablet, Rfl: 1   bimatoprost (LUMIGAN) 0.03 % ophthalmic solution, Place 1 drop into both eyes at bedtime., Disp: , Rfl:    diclofenac Sodium (VOLTAREN) 1 % GEL, Apply 2 g topically 4 (four) times daily., Disp: 100 g, Rfl: 3   Magnesium 250 MG TABS, Take 1 tablet by mouth with evening meal, Disp: 90 tablet, Rfl: 1   omeprazole (PRILOSEC) 20 MG capsule, Take 1 capsule (20 mg total) by mouth daily., Disp: 90 capsule, Rfl: 1   amlodipine-olmesartan (AZOR) 10-20 MG tablet, Take 1 tablet by mouth daily., Disp: 90 tablet, Rfl: 1   calcium-vitamin D (OSCAL WITH D) 500-200 MG-UNIT per tablet, Take 1 tablet by mouth daily. (Patient not taking: Reported on 11/04/2021), Disp: , Rfl:    citalopram (CELEXA) 10 MG tablet, Take 1 tablet (10 mg total) by mouth daily. (Patient not taking: Reported on 11/04/2021), Disp: 28 tablet, Rfl: 0   donepezil (ARICEPT) 10 MG tablet, Take 1 tablet (10 mg total) by mouth at bedtime., Disp: 90 tablet, Rfl: 1   hydrOXYzine (ATARAX) 25 MG tablet, TAKE 1 TABLET BY MOUTH ONCE DAILY AS NEEDED FOR AGITATION (Patient not taking: Reported on 11/04/2021), Disp: 30 tablet, Rfl: 0  oxybutynin (DITROPAN) 5 MG tablet, Take 1 tablet (5 mg total) by mouth daily., Disp: 90 tablet, Rfl: 1   Allergies  Allergen Reactions   Advil [Ibuprofen] Itching   Ciprofloxacin Other (See Comments)    REACTION: hives   Tramadol Other (See Comments)   Zolpidem Tartrate Other (See Comments)    REACTION: trembling   Unisom [Doxylamine] Swelling and Rash     Review of Systems  Constitutional: Negative.   Respiratory: Negative.  Negative for shortness of breath.   Cardiovascular: Negative.  Negative for chest pain and palpitations.  Gastrointestinal: Negative.   Neurological: Negative.      Today's Vitals   11/04/21 0834  BP: 128/70  Pulse: 63   Temp: 98.4 F (36.9 C)  TempSrc: Oral   There is no height or weight on file to calculate BMI.   Objective:  Physical Exam Vitals reviewed.  Constitutional:      General: She is not in acute distress.    Appearance: Normal appearance.  Cardiovascular:     Rate and Rhythm: Normal rate and regular rhythm.     Pulses: Normal pulses.     Heart sounds: Normal heart sounds. No murmur heard. Pulmonary:     Effort: Pulmonary effort is normal. No respiratory distress.     Breath sounds: Normal breath sounds. No wheezing.  Musculoskeletal:        General: Normal range of motion.     Comments: Using rolling walker and awaiting her regular walker   Neurological:     General: No focal deficit present.     Mental Status: She is alert and oriented to person, place, and time.     Cranial Nerves: No cranial nerve deficit.     Motor: No weakness.  Psychiatric:        Mood and Affect: Mood normal.        Behavior: Behavior normal.        Thought Content: Thought content normal.        Judgment: Judgment normal.         Assessment And Plan:     1. Essential hypertension Comments: Blood pressure is well controlled, Continue current medications - CMP14+EGFR - amlodipine-olmesartan (AZOR) 10-20 MG tablet; Take 1 tablet by mouth daily.  Dispense: 90 tablet; Refill: 1  2. Mixed hyperlipidemia Comments: Cholesterol levels have been elevated I will start low dose statin MWF once results return. Continue low fat diet.  - Lipid panel - CMP14+EGFR  3. Atherosclerotic cardiovascular disease Comments: Will start low dose statin MWF. Discussed risk of atherosclerotic disease and heart disease - Lipid panel - CMP14+EGFR  4. Urinary incontinence, unspecified type Comments: Stable, continue current medications. - oxybutynin (DITROPAN) 5 MG tablet; Take 1 tablet (5 mg total) by mouth daily.  Dispense: 90 tablet; Refill: 1   Will discontinue citalopram she is no longer taking and her depression  screen is 0.   Patient was given opportunity to ask questions. Patient verbalized understanding of the plan and was able to repeat key elements of the plan. All questions were answered to their satisfaction.  [name], FNP   I, [name], FNP, have reviewed all documentation for this visit. The documentation on 11/04/21 for the exam, diagnosis, procedures, and orders are all accurate and complete.   IF YOU HAVE BEEN REFERRED TO A SPECIALIST, IT MAY TAKE 1-2 WEEKS TO SCHEDULE/PROCESS THE REFERRAL. IF YOU HAVE NOT HEARD FROM US/SPECIALIST IN TWO WEEKS, PLEASE GIVE [place] A CALL AT [phone] X  [phone].   THE PATIENT IS ENCOURAGED TO PRACTICE SOCIAL DISTANCING DUE TO THE COVID-19 PANDEMIC.

## 2021-11-04 NOTE — Patient Instructions (Addendum)

## 2021-11-05 LAB — CMP14+EGFR
ALT: 11 IU/L (ref 0–32)
AST: 14 IU/L (ref 0–40)
Albumin/Globulin Ratio: 1.5 (ref 1.2–2.2)
Albumin: 4.1 g/dL (ref 3.7–4.7)
Alkaline Phosphatase: 114 IU/L (ref 44–121)
BUN/Creatinine Ratio: 13 (ref 12–28)
BUN: 13 mg/dL (ref 8–27)
Bilirubin Total: 0.6 mg/dL (ref 0.0–1.2)
CO2: 23 mmol/L (ref 20–29)
Calcium: 9.6 mg/dL (ref 8.7–10.3)
Chloride: 104 mmol/L (ref 96–106)
Creatinine, Ser: 1 mg/dL (ref 0.57–1.00)
Globulin, Total: 2.7 g/dL (ref 1.5–4.5)
Glucose: 94 mg/dL (ref 70–99)
Potassium: 4.5 mmol/L (ref 3.5–5.2)
Sodium: 140 mmol/L (ref 134–144)
Total Protein: 6.8 g/dL (ref 6.0–8.5)
eGFR: 55 mL/min/{1.73_m2} — ABNORMAL LOW (ref >59)

## 2021-11-06 DIAGNOSIS — G309 Alzheimer's disease, unspecified: Secondary | ICD-10-CM | POA: Diagnosis not present

## 2021-11-06 DIAGNOSIS — E782 Mixed hyperlipidemia: Secondary | ICD-10-CM | POA: Diagnosis not present

## 2021-11-06 DIAGNOSIS — I10 Essential (primary) hypertension: Secondary | ICD-10-CM | POA: Diagnosis not present

## 2021-11-06 DIAGNOSIS — R269 Unspecified abnormalities of gait and mobility: Secondary | ICD-10-CM | POA: Diagnosis not present

## 2021-11-06 DIAGNOSIS — S52532D Colles' fracture of left radius, subsequent encounter for closed fracture with routine healing: Secondary | ICD-10-CM | POA: Diagnosis not present

## 2021-11-06 DIAGNOSIS — M1711 Unilateral primary osteoarthritis, right knee: Secondary | ICD-10-CM | POA: Diagnosis not present

## 2021-11-11 DIAGNOSIS — S52532D Colles' fracture of left radius, subsequent encounter for closed fracture with routine healing: Secondary | ICD-10-CM | POA: Diagnosis not present

## 2021-11-11 DIAGNOSIS — G309 Alzheimer's disease, unspecified: Secondary | ICD-10-CM | POA: Diagnosis not present

## 2021-11-11 DIAGNOSIS — R269 Unspecified abnormalities of gait and mobility: Secondary | ICD-10-CM | POA: Diagnosis not present

## 2021-11-11 DIAGNOSIS — M1711 Unilateral primary osteoarthritis, right knee: Secondary | ICD-10-CM | POA: Diagnosis not present

## 2021-11-11 DIAGNOSIS — E782 Mixed hyperlipidemia: Secondary | ICD-10-CM | POA: Diagnosis not present

## 2021-11-11 DIAGNOSIS — I10 Essential (primary) hypertension: Secondary | ICD-10-CM | POA: Diagnosis not present

## 2021-12-08 ENCOUNTER — Telehealth: Payer: Self-pay | Admitting: *Deleted

## 2021-12-08 NOTE — Progress Notes (Signed)
  Care Coordination Note  12/08/2021 Name: [name] MRN: [id number] DOB: [birth year]  Tammy [name] is a 88 y.o. year old female who is a primary care patient of Arnette Felts, FNP and is actively engaged with the care management team. I reached out to Darrelyn Hillock by phone today to assist with re-scheduling a follow up visit with the RN Case Manager  Follow up plan: Unsuccessful telephone outreach attempt made. A HIPAA compliant phone message was left for the patient providing contact information and requesting a return call.   [facility]  Care Coordination Care Guide  Direct Dial: [phone]

## 2021-12-09 ENCOUNTER — Encounter: Payer: Self-pay | Admitting: Nurse Practitioner

## 2021-12-09 ENCOUNTER — Encounter: Payer: Medicare Other | Admitting: Nurse Practitioner

## 2021-12-09 ENCOUNTER — Ambulatory Visit (INDEPENDENT_AMBULATORY_CARE_PROVIDER_SITE_OTHER): Payer: Medicare Other

## 2021-12-09 VITALS — Ht 67.0 in | Wt 148.0 lb

## 2021-12-09 DIAGNOSIS — Z0000 Encounter for general adult medical examination without abnormal findings: Secondary | ICD-10-CM

## 2021-12-09 NOTE — Patient Instructions (Signed)
Tammy [name] , Thank you for taking time to come for your Medicare Wellness Visit. I appreciate your ongoing commitment to your health goals. Please review the following plan we discussed and let me know if I can assist you in the future.   Screening recommendations/referrals: Colonoscopy: not required Mammogram: not required Bone Density: scheduled for 02/12/2022 Recommended yearly ophthalmology/optometry visit for glaucoma screening and checkup Recommended yearly dental visit for hygiene and checkup  Vaccinations: Influenza vaccine: due Pneumococcal vaccine: due Tdap vaccine: due Shingles vaccine: discussed   Covid-19: 11/03/2021, 12/15/2019, 04/18/2019, 03/21/2019  Advanced directives: Advance directive discussed with you today.   Conditions/risks identified: none  Next appointment: Follow up in one year for your annual wellness visit    Preventive Care 65 Years and Older, Female Preventive care refers to lifestyle choices and visits with your health care provider that can promote health and wellness. What does preventive care include? A yearly physical exam. This is also called an annual well check. Dental exams once or twice a year. Routine eye exams. Ask your health care provider how often you should have your eyes checked. Personal lifestyle choices, including: Daily care of your teeth and gums. Regular physical activity. Eating a healthy diet. Avoiding tobacco and drug use. Limiting alcohol use. Practicing safe sex. Taking low-dose aspirin every day. Taking vitamin and mineral supplements as recommended by your health care provider. What happens during an annual well check? The services and screenings done by your health care provider during your annual well check will depend on your age, overall health, lifestyle risk factors, and family history of disease. Counseling  Your health care provider may ask you questions about your: Alcohol use. Tobacco use. Drug  use. Emotional well-being. Home and relationship well-being. Sexual activity. Eating habits. History of falls. Memory and ability to understand (cognition). Work and work Astronomer. Reproductive health. Screening  You may have the following tests or measurements: Height, weight, and BMI. Blood pressure. Lipid and cholesterol levels. These may be checked every 5 years, or more frequently if you are over 20 years old. Skin check. Lung cancer screening. You may have this screening every year starting at age 92 if you have a 30-pack-year history of smoking and currently smoke or have quit within the past 15 years. Fecal occult blood test (FOBT) of the stool. You may have this test every year starting at age 34. Flexible sigmoidoscopy or colonoscopy. You may have a sigmoidoscopy every 5 years or a colonoscopy every 10 years starting at age 54. Hepatitis C blood test. Hepatitis B blood test. Sexually transmitted disease (STD) testing. Diabetes screening. This is done by checking your blood sugar (glucose) after you have not eaten for a while (fasting). You may have this done every 1-3 years. Bone density scan. This is done to screen for osteoporosis. You may have this done starting at age 79. Mammogram. This may be done every 1-2 years. Talk to your health care provider about how often you should have regular mammograms. Talk with your health care provider about your test results, treatment options, and if necessary, the need for more tests. Vaccines  Your health care provider may recommend certain vaccines, such as: Influenza vaccine. This is recommended every year. Tetanus, diphtheria, and acellular pertussis (Tdap, Td) vaccine. You may need a Td booster every 10 years. Zoster vaccine. You may need this after age 13. Pneumococcal 13-valent conjugate (PCV13) vaccine. One dose is recommended after age 65. Pneumococcal polysaccharide (PPSV23) vaccine. One dose is recommended after  age  47. Talk to your health care provider about which screenings and vaccines you need and how often you need them. This information is not intended to replace advice given to you by your health care provider. Make sure you discuss any questions you have with your health care provider. Document Released: 02/07/2015 Document Revised: 10/01/2015 Document Reviewed: 11/12/2014 Elsevier Interactive Patient Education  2017 [employer] Prevention in the Home Falls can cause injuries. They can happen to people of all ages. There are many things you can do to make your home safe and to help prevent falls. What can I do on the outside of my home? Regularly fix the edges of walkways and driveways and fix any cracks. Remove anything that might make you trip as you walk through a door, such as a raised step or threshold. Trim any bushes or trees on the path to your home. Use bright outdoor lighting. Clear any walking paths of anything that might make someone trip, such as rocks or tools. Regularly check to see if handrails are loose or broken. Make sure that both sides of any steps have handrails. Any raised decks and porches should have guardrails on the edges. Have any leaves, snow, or ice cleared regularly. Use sand or salt on walking paths during winter. Clean up any spills in your garage right away. This includes oil or grease spills. What can I do in the bathroom? Use night lights. Install grab bars by the toilet and in the tub and shower. Do not use towel bars as grab bars. Use non-skid mats or decals in the tub or shower. If you need to sit down in the shower, use a plastic, non-slip stool. Keep the floor dry. Clean up any water that spills on the floor as soon as it happens. Remove soap buildup in the tub or shower regularly. Attach bath mats securely with double-sided non-slip rug tape. Do not have throw rugs and other things on the floor that can make you trip. What can I do in the  bedroom? Use night lights. Make sure that you have a light by your bed that is easy to reach. Do not use any sheets or blankets that are too big for your bed. They should not hang down onto the floor. Have a firm chair that has side arms. You can use this for support while you get dressed. Do not have throw rugs and other things on the floor that can make you trip. What can I do in the kitchen? Clean up any spills right away. Avoid walking on wet floors. Keep items that you use a lot in easy-to-reach places. If you need to reach something above you, use a strong step stool that has a grab bar. Keep electrical cords out of the way. Do not use floor polish or wax that makes floors slippery. If you must use wax, use non-skid floor wax. Do not have throw rugs and other things on the floor that can make you trip. What can I do with my stairs? Do not leave any items on the stairs. Make sure that there are handrails on both sides of the stairs and use them. Fix handrails that are broken or loose. Make sure that handrails are as long as the stairways. Check any carpeting to make sure that it is firmly attached to the stairs. Fix any carpet that is loose or worn. Avoid having throw rugs at the top or bottom of the stairs. If you do  have throw rugs, attach them to the floor with carpet tape. Make sure that you have a light switch at the top of the stairs and the bottom of the stairs. If you do not have them, ask someone to add them for you. What else can I do to help prevent falls? Wear shoes that: Do not have high heels. Have rubber bottoms. Are comfortable and fit you well. Are closed at the toe. Do not wear sandals. If you use a stepladder: Make sure that it is fully opened. Do not climb a closed stepladder. Make sure that both sides of the stepladder are locked into place. Ask someone to hold it for you, if possible. Clearly mark and make sure that you can see: Any grab bars or  handrails. First and last steps. Where the edge of each step is. Use tools that help you move around (mobility aids) if they are needed. These include: Canes. Walkers. Scooters. Crutches. Turn on the lights when you go into a dark area. Replace any light bulbs as soon as they burn out. Set up your furniture so you have a clear path. Avoid moving your furniture around. If any of your floors are uneven, fix them. If there are any pets around you, be aware of where they are. Review your medicines with your doctor. Some medicines can make you feel dizzy. This can increase your chance of falling. Ask your doctor what other things that you can do to help prevent falls. This information is not intended to replace advice given to you by your health care provider. Make sure you discuss any questions you have with your health care provider. Document Released: 11/07/2008 Document Revised: 06/19/2015 Document Reviewed: 02/15/2014 Elsevier Interactive Patient Education  2017 [employer].

## 2021-12-09 NOTE — Progress Notes (Signed)
I connected with Tammy [name] today by telephone and verified that I am speaking with the correct person using two identifiers. Location patient: home Location provider: work Persons participating in the virtual visit: Tammy [name], Tammy [name] (niece), Tammy PonderNickeah Zeidy Tayag LPN.   I discussed the limitations, risks, security and privacy concerns of performing an evaluation and management service by telephone and the availability of in person appointments. I also discussed with the patient that there may be a patient responsible charge related to this service. The patient expressed understanding and verbally consented to this telephonic visit.    Interactive audio and video telecommunications were attempted between this provider and patient, however failed, due to patient having technical difficulties OR patient did not have access to video capability.  We continued and completed visit with audio only.     Vital signs may be patient reported or missing.  Subjective:   Tammy HillockLillie M [name] is a 88 y.o. female who presents for Medicare Annual (Subsequent) preventive examination.  Review of Systems     Cardiac Risk Factors include: advanced age (>4555men, 90>65 women);dyslipidemia;hypertension     Objective:    Today's Vitals   12/09/21 0955  Weight: 148 lb (67.1 kg)  Height: 5\' 7"  (1.702 m)   Body mass index is 23.18 kg/m.     12/09/2021   10:00 AM 08/30/2018   11:37 AM 10/17/2016    4:19 PM 10/17/2016   11:02 AM 04/12/2016    8:45 AM 09/30/2015   12:52 PM 09/26/2015    4:06 AM  Advanced Directives  Does Patient Have a Medical Advance Directive? No No No No No No No  Would patient like information on creating a medical advance directive?   No - Patient declined No - Patient declined       Current Medications (verified) Outpatient Encounter Medications as of 12/09/2021  Medication Sig   acetaminophen (TYLENOL) 500 MG tablet Take 1 tablet (500 mg total) by mouth every 6 (six) hours as needed  for mild pain.   amlodipine-olmesartan (AZOR) 10-20 MG tablet Take 1 tablet by mouth daily.   atorvastatin (LIPITOR) 10 MG tablet Take 1 tab by mouth at bedtime MWF   calcium-vitamin D (OSCAL WITH D) 500-200 MG-UNIT per tablet Take 1 tablet by mouth daily.   citalopram (CELEXA) 10 MG tablet Take 1 tablet (10 mg total) by mouth daily.   diclofenac Sodium (VOLTAREN) 1 % GEL Apply 2 g topically 4 (four) times daily.   donepezil (ARICEPT) 10 MG tablet Take 1 tablet (10 mg total) by mouth at bedtime.   hydrOXYzine (ATARAX) 25 MG tablet TAKE 1 TABLET BY MOUTH ONCE DAILY AS NEEDED FOR AGITATION   Magnesium 250 MG TABS Take 1 tablet by mouth with evening meal   omeprazole (PRILOSEC) 20 MG capsule Take 1 capsule (20 mg total) by mouth daily.   oxybutynin (DITROPAN) 5 MG tablet Take 1 tablet (5 mg total) by mouth daily.   bimatoprost (LUMIGAN) 0.03 % ophthalmic solution Place 1 drop into both eyes at bedtime. (Patient not taking: Reported on 12/09/2021)   No facility-administered encounter medications on file as of 12/09/2021.    Allergies (verified) Advil [ibuprofen], Ciprofloxacin, Tramadol, Zolpidem tartrate, and Unisom [doxylamine]   History: Past Medical History:  Diagnosis Date   Acid reflux    Glaucoma    High cholesterol    Hypertension    Lower GI bleed    Rectal bleeding 09/26/2015   Past Surgical History:  Procedure Laterality Date   ABDOMINAL HYSTERECTOMY  COLONOSCOPY N/A 09/30/2015   Procedure: COLONOSCOPY;  Surgeon: Jeani Hawking, MD;  Location: [facility];  Service: Endoscopy;  Laterality: N/A;   JOINT REPLACEMENT     REVISION TOTAL HIP ARTHROPLASTY Bilateral    2011 and 2012   Family History  Problem Relation Age of Onset   Hypertension Mother    Hypertension Sister    Hypertension Brother    Heart disease Brother    Social History   Socioeconomic History   Marital status: Widowed    Spouse name: Not on file   Number of children: 0   Years of education: some  college   Highest education level: Not on file  Occupational History   Occupation: Retired  Tobacco Use   Smoking status: Never   Smokeless tobacco: Never  Vaping Use   Vaping Use: Never used  Substance and Sexual Activity   Alcohol use: No   Drug use: No   Sexual activity: Not Currently  Other Topics Concern   Not on file  Social History Narrative   Lives with mother who is 68 and sister currently (07/04/2018)   Right handed    Caffeine use: none   Social Determinants of Health   Financial Resource Strain: Low Risk  (12/09/2021)   Overall Financial Resource Strain (CARDIA)    Difficulty of Paying Living Expenses: Not hard at all  Food Insecurity: No Food Insecurity (12/09/2021)   Hunger Vital Sign    Worried About Running Out of Food in the Last Year: Never true    Ran Out of Food in the Last Year: Never true  Transportation Needs: No Transportation Needs (12/09/2021)   PRAPARE - Administrator, Civil Service (Medical): No    Lack of Transportation (Non-Medical): No  Physical Activity: Inactive (12/09/2021)   Exercise Vital Sign    Days of Exercise per Week: 0 days    Minutes of Exercise per Session: 0 min  Stress: No Stress Concern Present (12/09/2021)   [employer] of Occupational Health - Occupational Stress Questionnaire    Feeling of Stress : Not at all  Social Connections: Not on file    Tobacco Counseling Counseling given: Not Answered   Clinical Intake:  Pre-visit preparation completed: Yes  Pain : No/denies pain     Nutritional Status: BMI of 19-24  Normal Nutritional Risks: None Diabetes: No  How often do you need to have someone help you when you read instructions, pamphlets, or other written materials from your doctor or pharmacy?: 3 - Sometimes  Diabetic? no  Interpreter Needed?: No  Information entered by :: NAllen LPN   Activities of Daily Living    12/09/2021   10:03 AM  In your present state of health, do you  have any difficulty performing the following activities:  Hearing? 0  Vision? 0  Difficulty concentrating or making decisions? 1  Walking or climbing stairs? 1  Dressing or bathing? 0  Doing errands, shopping? 1  Preparing Food and eating ? Y  Using the Toilet? N  In the past six months, have you accidently leaked urine? Y  Do you have problems with loss of bowel control? N  Managing your Medications? Y  Managing your Finances? Y  Housekeeping or managing your Housekeeping? Y    Patient Care Team: Arnette Felts, FNP as PCP - General (General Practice) Clarene Duke, Karma Lew, RN as [facility] Care Management  Indicate any recent Medical Services you may have received from other than [facility] providers in  the past year (date may be approximate).     Assessment:   This is a routine wellness examination for [place].  Hearing/Vision screen Vision Screening - Comments:: No regular eye exams, [facility] in [place]  Dietary issues and exercise activities discussed: Current Exercise Habits: The patient does not participate in regular exercise at present   Goals Addressed             This Visit's Progress    Patient Stated       12/09/2021, be more mobile       Depression Screen    12/09/2021   10:03 AM 11/04/2021    8:48 AM 11/04/2021    8:32 AM 02/16/2021   10:48 AM 10/23/2020    2:45 PM 05/07/2019   10:30 AM 01/03/2019    8:55 AM  PHQ 2/9 Scores  PHQ - 2 Score 0 0 0 4 0 0 0  PHQ- 9 Score  0  4       Fall Risk    12/09/2021   10:01 AM 11/04/2021    8:31 AM 04/01/2021    3:55 PM 10/23/2020    2:45 PM 05/07/2019   10:30 AM  Fall Risk   Falls in the past year? 1 1 1 1  0  Comment lost balance, slipped in bathroom      Number falls in past yr: 1 0 1 1 0  Injury with Fall? 1 1 0 0 0  Comment broke wrist      Risk for fall due to : Impaired balance/gait;Impaired mobility;Medication side effect No Fall Risks  Impaired mobility   Follow up Falls evaluation  completed;Education provided;Falls prevention discussed Falls evaluation completed       FALL RISK PREVENTION PERTAINING TO THE HOME:  Any stairs in or around the home? Yes  If so, are there any without handrails?  ramp Home free of loose throw rugs in walkways, pet beds, electrical cords, etc? Yes  Adequate lighting in your home to reduce risk of falls? Yes   ASSISTIVE DEVICES UTILIZED TO PREVENT FALLS:  Life alert? No  Use of a cane, walker or w/c? Yes  Grab bars in the bathroom? Yes  Shower chair or bench in shower? Yes  Elevated toilet seat or a handicapped toilet? Yes   TIMED UP AND GO:  Was the test performed? No .      Cognitive Function:  6 CIT not administered. Patient has diagnosis of Alzheimer's      07/04/2018   12:39 PM  Montreal Cognitive Assessment   Visuospatial/ Executive (0/5) 2  Naming (0/3) 2  Attention: Read list of digits (0/2) 2  Attention: Read list of letters (0/1) 1  Attention: Serial 7 subtraction starting at 100 (0/3) 2  Language: Repeat phrase (0/2) 2  Language : Fluency (0/1) 0  Abstraction (0/2) 0  Delayed Recall (0/5) 0  Orientation (0/6) 3  Total 14  Adjusted Score (based on education) 14      08/30/2018   11:41 AM  6CIT Screen  What Year? 0 points  What month? 3 points  What time? 0 points  Count back from 20 0 points  Months in reverse 2 points  Repeat phrase 8 points  Total Score 13 points    Immunizations Immunization History  Administered Date(s) Administered   Influenza, High Dose Seasonal PF 10/19/2016   Influenza-Unspecified 10/25/2012, 11/10/2017   MODERNA COVID-19 SARS-COV-2 PEDS BIVALENT BOOSTER 6Y-11Y 03/21/2019, 04/18/2019, 12/15/2019   Moderna Covid-19 Vaccine Bivalent  Booster 18yrs & up 11/03/2021   Tdap 03/05/2009, 03/05/2009   Unspecified SARS-COV-2 Vaccination 03/21/2019, 04/23/2019, 12/15/2019    TDAP status: Due, Education has been provided regarding the importance of this vaccine. Advised may  receive this vaccine at local pharmacy or Health Dept. Aware to provide a copy of the vaccination record if obtained from local pharmacy or Health Dept. Verbalized acceptance and understanding.  Flu Vaccine status: Up to date  Pneumococcal vaccine status: Due, Education has been provided regarding the importance of this vaccine. Advised may receive this vaccine at local pharmacy or Health Dept. Aware to provide a copy of the vaccination record if obtained from local pharmacy or Health Dept. Verbalized acceptance and understanding.  Covid-19 vaccine status: Completed vaccines  Qualifies for Shingles Vaccine? Yes   Zostavax completed No   Shingrix Completed?: No.    Education has been provided regarding the importance of this vaccine. Patient has been advised to call insurance company to determine out of pocket expense if they have not yet received this vaccine. Advised may also receive vaccine at local pharmacy or Health Dept. Verbalized acceptance and understanding.  Screening Tests Health Maintenance  Topic Date Due   Zoster Vaccines- Shingrix (1 of 2) Never done   Pneumonia Vaccine 2+ Years old (1 - PCV) Never done   TETANUS/TDAP  03/06/2019   Medicare Annual Wellness (AWV)  08/30/2019   INFLUENZA VACCINE  08/25/2021   DEXA SCAN  Completed   COVID-19 Vaccine  Completed   HPV VACCINES  Aged Out    Health Maintenance  Health Maintenance Due  Topic Date Due   Zoster Vaccines- Shingrix (1 of 2) Never done   Pneumonia Vaccine 41+ Years old (1 - PCV) Never done   TETANUS/TDAP  03/06/2019   Medicare Annual Wellness (AWV)  08/30/2019   INFLUENZA VACCINE  08/25/2021    Colorectal cancer screening: No longer required.   Mammogram status: No longer required due to age.  Bone Density status: scheduled for 02/12/2022  Lung Cancer Screening: (Low Dose CT Chest recommended if Age 5-80 years, 30 pack-year currently smoking OR have quit w/in 15years.) does not qualify.   Lung Cancer  Screening Referral: no  Additional Screening:  Hepatitis C Screening: does not qualify;   Vision Screening: Recommended annual ophthalmology exams for early detection of glaucoma and other disorders of the eye. Is the patient up to date with their annual eye exam?  No  Who is the provider or what is the name of the office in which the patient attends annual eye exams? [facility] in [place] If pt is not established with a provider, would they like to be referred to a provider to establish care? No .   Dental Screening: Recommended annual dental exams for proper oral hygiene  Community Resource Referral / Chronic Care Management: CRR required this visit?  No   CCM required this visit?  No      Plan:     I have personally reviewed and noted the following in the patient's chart:   Medical and social history Use of alcohol, tobacco or illicit drugs  Current medications and supplements including opioid prescriptions. Patient is not currently taking opioid prescriptions. Functional ability and status Nutritional status Physical activity Advanced directives List of other physicians Hospitalizations, surgeries, and ER visits in previous 12 months Vitals Screenings to include cognitive, depression, and falls Referrals and appointments  In addition, I have reviewed and discussed with patient certain preventive protocols, quality metrics, and best practice  recommendations. A written personalized care plan for preventive services as well as general preventive health recommendations were provided to patient.     Barb Merino, LPN   [id number]   Nurse Notes: none  Due to this being a virtual visit, the after visit summary with patients personalized plan was offered to patient via mail or my-chart.  Patient would like to access on my-chart

## 2021-12-09 NOTE — Progress Notes (Signed)
Patient was seen in October for chronic problems, no current issues was to f/u in 4 months of last visit.

## 2021-12-09 NOTE — Patient Instructions (Signed)
Hypertension, Adult High blood pressure (hypertension) is when the force of blood pumping through the arteries is too strong. The arteries are the blood vessels that carry blood from the heart throughout the body. Hypertension forces the heart to work harder to pump blood and may cause arteries to become narrow or stiff. Untreated or uncontrolled hypertension can lead to a heart attack, heart failure, a stroke, kidney disease, and other problems. A blood pressure reading consists of a higher number over a lower number. Ideally, your blood pressure should be below 120/80. The first ("top") number is called the systolic pressure. It is a measure of the pressure in your arteries as your heart beats. The second ("bottom") number is called the diastolic pressure. It is a measure of the pressure in your arteries as the heart relaxes. What are the causes? The exact cause of this condition is not known. There are some conditions that result in high blood pressure. What increases the risk? Certain factors may make you more likely to develop high blood pressure. Some of these risk factors are under your control, including: Smoking. Not getting enough exercise or physical activity. Being overweight. Having too much fat, sugar, calories, or salt (sodium) in your diet. Drinking too much alcohol. Other risk factors include: Having a personal history of heart disease, diabetes, high cholesterol, or kidney disease. Stress. Having a family history of high blood pressure and high cholesterol. Having obstructive sleep apnea. Age. The risk increases with age. What are the signs or symptoms? High blood pressure may not cause symptoms. Very high blood pressure (hypertensive crisis) may cause: Headache. Fast or irregular heartbeats (palpitations). Shortness of breath. Nosebleed. Nausea and vomiting. Vision changes. Severe chest pain, dizziness, and seizures. How is this diagnosed? This condition is diagnosed by  measuring your blood pressure while you are seated, with your arm resting on a flat surface, your legs uncrossed, and your feet flat on the floor. The cuff of the blood pressure monitor will be placed directly against the skin of your upper arm at the level of your heart. Blood pressure should be measured at least twice using the same arm. Certain conditions can cause a difference in blood pressure between your right and left arms. If you have a high blood pressure reading during one visit or you have normal blood pressure with other risk factors, you may be asked to: Return on a different day to have your blood pressure checked again. Monitor your blood pressure at home for 1 week or longer. If you are diagnosed with hypertension, you may have other blood or imaging tests to help your health care provider understand your overall risk for other conditions. How is this treated? This condition is treated by making healthy lifestyle changes, such as eating healthy foods, exercising more, and reducing your alcohol intake. You may be referred for counseling on a healthy diet and physical activity. Your health care provider may prescribe medicine if lifestyle changes are not enough to get your blood pressure under control and if: Your systolic blood pressure is above 130. Your diastolic blood pressure is above 80. Your personal target blood pressure may vary depending on your medical conditions, your age, and other factors. Follow these instructions at home: Eating and drinking  Eat a diet that is high in fiber and potassium, and low in sodium, added sugar, and fat. An example of this eating plan is called the DASH diet. DASH stands for Dietary Approaches to Stop Hypertension. To eat this way: Eat   plenty of fresh fruits and vegetables. Try to fill one half of your plate at each meal with fruits and vegetables. Eat whole grains, such as whole-wheat pasta, brown rice, or whole-grain bread. Fill about one  fourth of your plate with whole grains. Eat or drink low-fat dairy products, such as skim milk or low-fat yogurt. Avoid fatty cuts of meat, processed or cured meats, and poultry with skin. Fill about one fourth of your plate with lean proteins, such as fish, chicken without skin, beans, eggs, or tofu. Avoid pre-made and processed foods. These tend to be higher in sodium, added sugar, and fat. Reduce your daily sodium intake. Many people with hypertension should eat less than 1,500 mg of sodium a day. Do not drink alcohol if: Your health care provider tells you not to drink. You are pregnant, may be pregnant, or are planning to become pregnant. If you drink alcohol: Limit how much you have to: 0-1 drink a day for women. 0-2 drinks a day for men. Know how much alcohol is in your drink. In the [place] one drink equals one 12 oz bottle of beer (355 mL), one 5 oz glass of wine (148 mL), or one 1 oz glass of hard liquor (44 mL). Lifestyle  Work with your health care provider to maintain a healthy body weight or to lose weight. Ask what an ideal weight is for you. Get at least 30 minutes of exercise that causes your heart to beat faster (aerobic exercise) most days of the week. Activities may include walking, swimming, or biking. Include exercise to strengthen your muscles (resistance exercise), such as Pilates or lifting weights, as part of your weekly exercise routine. Try to do these types of exercises for 30 minutes at least 3 days a week. Do not use any products that contain nicotine or tobacco. These products include cigarettes, chewing tobacco, and vaping devices, such as e-cigarettes. If you need help quitting, ask your health care provider. Monitor your blood pressure at home as told by your health care provider. Keep all follow-up visits. This is important. Medicines Take over-the-counter and prescription medicines only as told by your health care provider. Follow directions carefully. Blood  pressure medicines must be taken as prescribed. Do not skip doses of blood pressure medicine. Doing this puts you at risk for problems and can make the medicine less effective. Ask your health care provider about side effects or reactions to medicines that you should watch for. Contact a health care provider if you: Think you are having a reaction to a medicine you are taking. Have headaches that keep coming back (recurring). Feel dizzy. Have swelling in your ankles. Have trouble with your vision. Get help right away if you: Develop a severe headache or confusion. Have unusual weakness or numbness. Feel faint. Have severe pain in your chest or abdomen. Vomit repeatedly. Have trouble breathing. These symptoms may be an emergency. Get help right away. Call 911. Do not wait to see if the symptoms will go away. Do not drive yourself to the hospital. Summary Hypertension is when the force of blood pumping through your arteries is too strong. If this condition is not controlled, it may put you at risk for serious complications. Your personal target blood pressure may vary depending on your medical conditions, your age, and other factors. For most people, a normal blood pressure is less than 120/80. Hypertension is treated with lifestyle changes, medicines, or a combination of both. Lifestyle changes include losing weight, eating a healthy,   low-sodium diet, exercising more, and limiting alcohol. This information is not intended to replace advice given to you by your health care provider. Make sure you discuss any questions you have with your health care provider. Document Revised: 11/18/2020 Document Reviewed: 11/18/2020 Elsevier Patient Education  2023 [employer] Inc.  

## 2021-12-22 NOTE — Progress Notes (Signed)
  Care Coordination Note  12/22/2021 Name: [name] MRN: [id number] DOB: Jun 01, 1933  Tammy [name] is a 88 y.o. year old female who is a primary care patient of Arnette Felts, FNP and is actively engaged with the care management team. I reached out to Darrelyn Hillock by phone today to assist with re-scheduling a follow up visit with the RN Case Manager  Follow up plan: Telephone appointment with care management team member scheduled for:01/05/22  [facility] Coordination Care Guide  Direct Dial: [phone]

## 2022-01-05 ENCOUNTER — Ambulatory Visit: Payer: Self-pay

## 2022-01-05 NOTE — Patient Outreach (Addendum)
  Care Coordination   Follow Up Visit Note   01/05/2022 Name: Tammy [name] MRN: [id number] DOB: Jun 08, 1933  Tammy [name] is a 88 y.o. year old female who sees Arnette Felts, FNP for primary care. I spoke with  Tammy [name] by phone today.  What matters to the patients health and wellness today?  Niece Tammy [name] will continue to assist with her aunt with level of care needs. She will report new symptoms or concerns to patient's PCP.     Goals Addressed               This Visit's Progress     Patient Stated     COMPLETED: "to have her wrist fracture heal without complications" (pt-stated)        Care Coordination Interventions: Placed successful outbound call to niece Tammy [name] Evaluation of current treatment plan related to left wrist fracture and patient's adherence to plan as established by provider Determined patient has made a complete recovery from her wrist fracture Assessed for falls since last RN contact, determined patient has not had any further calls and niece Tammy [name] feels she is doing very well overall at this time            "To lower Cholesterol levels" Provider established cholesterol goals reviewed Counseled on importance of regular laboratory monitoring as prescribed Provided HLD educational materials Reviewed role and benefits of statin for ASCVD risk reduction Reviewed importance of limiting foods high in cholesterol Reviewed exercise goals and target of 150 minutes per week    SDOH assessments and interventions completed:  No     Care Coordination Interventions:  Yes, provided   Follow up plan: Follow up call scheduled for 04/06/22 @0930  AM    Encounter Outcome:  Pt. Visit Completed

## 2022-01-05 NOTE — Patient Instructions (Addendum)
Visit Information  Thank you for taking time to visit with me today. Please don't hesitate to contact me if I can be of assistance to you.   Following are the goals we discussed today:   Goals Addressed               This Visit's Progress     Patient Stated     COMPLETED: "to have her wrist fracture heal without complications" (pt-stated)        Care Coordination Interventions: Placed successful outbound call to niece [name] Evaluation of current treatment plan related to left wrist fracture and patient's adherence to plan as established by provider Determined patient has made a complete recovery from her wrist fracture Assessed for falls since last RN contact, determined patient has not had any further calls and niece [name] feels she is doing very well overall at this time       To lower Cholesterol levels (pt-stated)        Care Coordination Interventions: Provider established cholesterol goals reviewed Counseled on importance of regular laboratory monitoring as prescribed Provided HLD educational materials Reviewed role and benefits of statin for ASCVD risk reduction Reviewed importance of limiting foods high in cholesterol Reviewed exercise goals and target of 150 minutes per week        Our next appointment is by telephone on 04/06/22 at 0930AM  Please call the care guide team at [phone] if you need to cancel or reschedule your appointment.   If you are experiencing a Mental Health or Behavioral Health Crisis or need someone to talk to, please call 1-800-273-TALK (toll free, 24 hour hotline)  Patient verbalizes understanding of instructions and care plan provided today and agrees to view in MyChart. Active MyChart status and patient understanding of how to access instructions and care plan via MyChart confirmed with patient.     Delsa Sale, RN, BSN, CCM Care Management Coordinator [facility] Care Management Direct Phone: [phone]

## 2022-02-05 ENCOUNTER — Encounter: Payer: Self-pay | Admitting: Nurse Practitioner

## 2022-02-11 ENCOUNTER — Encounter: Payer: Self-pay | Admitting: Nurse Practitioner

## 2022-02-12 ENCOUNTER — Other Ambulatory Visit: Payer: Medicare Other

## 2022-03-15 ENCOUNTER — Encounter: Payer: Self-pay | Admitting: Nurse Practitioner

## 2022-03-15 ENCOUNTER — Telehealth (INDEPENDENT_AMBULATORY_CARE_PROVIDER_SITE_OTHER): Payer: Medicare Other | Admitting: Nurse Practitioner

## 2022-03-15 VITALS — BP 149/87 | HR 60 | Temp 97.8°F | Ht 67.0 in | Wt 137.0 lb

## 2022-03-15 DIAGNOSIS — K641 Second degree hemorrhoids: Secondary | ICD-10-CM | POA: Insufficient documentation

## 2022-03-15 DIAGNOSIS — R7303 Prediabetes: Secondary | ICD-10-CM | POA: Insufficient documentation

## 2022-03-15 DIAGNOSIS — R32 Unspecified urinary incontinence: Secondary | ICD-10-CM

## 2022-03-15 DIAGNOSIS — R296 Repeated falls: Secondary | ICD-10-CM

## 2022-03-15 DIAGNOSIS — D509 Iron deficiency anemia, unspecified: Secondary | ICD-10-CM | POA: Insufficient documentation

## 2022-03-15 DIAGNOSIS — K429 Umbilical hernia without obstruction or gangrene: Secondary | ICD-10-CM | POA: Insufficient documentation

## 2022-03-15 DIAGNOSIS — R61 Generalized hyperhidrosis: Secondary | ICD-10-CM | POA: Insufficient documentation

## 2022-03-15 MED ORDER — OXYBUTYNIN CHLORIDE 5 MG PO TABS: 5.0000 mg | ORAL_TABLET | Freq: Two times a day (BID) | ORAL | 1 refills | Status: DC

## 2022-03-15 NOTE — Progress Notes (Signed)
I,Sheena H Holbrook,acting as a Education administrator for [name], FNP.,have documented all relevant documentation on the behalf of [name], FNP,as directed by  [name], FNP while in the presence of [name], [place].     Virtual Visit via Mychart   This visit type was conducted due to national recommendations for restrictions regarding the COVID-19 Pandemic (e.g. social distancing) in an effort to limit this patient's exposure and mitigate transmission in our community.  Due to her co-morbid illnesses, this patient is at least at moderate risk for complications without adequate follow up.  This format is felt to be most appropriate for this patient at this time.  All issues noted in this document were discussed and addressed.  A limited physical exam was performed with this format.    This visit type was conducted due to national recommendations for restrictions regarding the COVID-19 Pandemic (e.g. social distancing) in an effort to limit this patient's exposure and mitigate transmission in our community.  Patients identity confirmed using two different identifiers.  This format is felt to be most appropriate for this patient at this time.  All issues noted in this document were discussed and addressed.  No physical exam was performed (except for noted visual exam findings with Video Visits).    Date:  03/15/2022   ID:  Tammy [name], DOB Aug 14, 1933, MRN [id number]  Patient Location:  Home - spoke with Alben Spittle, Dorann Ou and sister Katie  Provider location:   Office    Chief Complaint:  urinary frequency at night  History of Present Illness:    Tammy [name] is a 89 y.o. female who presents via video conferencing for a telehealth visit today.    The patient does not have symptoms concerning for COVID-19 infection (fever, chills, cough, or new shortness of breath).   Patient presents today for urinary frequency and incontinence, as reported by her sister. She is  drinking plenty of water. She does have occasional bowel incontinence. Per sister patient has not complained of dysuria, hematuria, abd pain, fever/chills, nausea/vomiting. Patient has also had several recent falls, she does use a walker; denies any injuries. She is having knee pain, followed by ortho.  She is having some imbalance as well. She was not having any relief to her knee. They do not want to do any knee replacement. The last time she had physical therapy was a couple months.   She had bacon this morning, she will sometimes add salt to her foods.      Past Medical History:  Diagnosis Date   Acid reflux    Glaucoma    High cholesterol    Hypertension    Lower GI bleed    Rectal bleeding 09/26/2015   Past Surgical History:  Procedure Laterality Date   ABDOMINAL HYSTERECTOMY     COLONOSCOPY N/A 09/30/2015   Procedure: COLONOSCOPY;  Surgeon: Carol Ada, MD;  Location: [facility];  Service: Endoscopy;  Laterality: N/A;   JOINT REPLACEMENT     REVISION TOTAL HIP ARTHROPLASTY Bilateral    2011 and 2012     Current Meds  Medication Sig   acetaminophen (TYLENOL) 500 MG tablet Take 1 tablet (500 mg total) by mouth every 6 (six) hours as needed for mild pain.   amlodipine-olmesartan (AZOR) 10-20 MG tablet Take 1 tablet by mouth daily.   atorvastatin (LIPITOR) 10 MG tablet Take 1 tab by mouth at bedtime MWF   calcium-vitamin D (OSCAL WITH D) 500-200 MG-UNIT per tablet Take 1  tablet by mouth daily.   citalopram (CELEXA) 10 MG tablet Take 1 tablet (10 mg total) by mouth daily.   diclofenac Sodium (VOLTAREN) 1 % GEL Apply 2 g topically 4 (four) times daily.   donepezil (ARICEPT) 10 MG tablet Take 1 tablet (10 mg total) by mouth at bedtime.   hydrOXYzine (ATARAX) 25 MG tablet TAKE 1 TABLET BY MOUTH ONCE DAILY AS NEEDED FOR AGITATION   Magnesium 250 MG TABS Take 1 tablet by mouth with evening meal   omeprazole (PRILOSEC) 20 MG capsule Take 1 capsule (20 mg total) by mouth daily.    [DISCONTINUED] oxybutynin (DITROPAN) 5 MG tablet Take 1 tablet (5 mg total) by mouth daily.     Allergies:   Advil [ibuprofen], Ciprofloxacin, Tramadol, Zolpidem tartrate, and Unisom [doxylamine]   Social History   Tobacco Use   Smoking status: Never   Smokeless tobacco: Never  Vaping Use   Vaping Use: Never used  Substance Use Topics   Alcohol use: No   Drug use: No     Family Hx: The patient's family history includes Heart disease in her brother; Hypertension in her brother, mother, and sister.  ROS:   Please see the history of present illness.    Review of Systems  Constitutional: Negative.   Respiratory: Negative.    Cardiovascular: Negative.   Genitourinary:  Positive for frequency (mostly at night).  Musculoskeletal:  Positive for joint pain.  Neurological: Negative.   Psychiatric/Behavioral: Negative.      All other systems reviewed and are negative.   Labs/Other Tests and Data Reviewed:    Recent Labs: 11/04/2021: ALT 11; BUN 13; Creatinine, Ser 1.00; Potassium 4.5; Sodium 140   Recent Lipid Panel Lab Results  Component Value Date/Time   CHOL 213 (H) 11/04/2021 09:25 AM   TRIG 67 11/04/2021 09:25 AM   HDL 74 11/04/2021 09:25 AM   CHOLHDL 2.9 11/04/2021 09:25 AM   LDLCALC 127 (H) 11/04/2021 09:25 AM    Wt Readings from Last 3 Encounters:  03/15/22 137 lb (62.1 kg)  12/09/21 148 lb (67.1 kg)  09/17/21 141 lb (64 kg)     Exam:    Vital Signs:  BP (!) 149/87   Pulse 60   Temp 97.8 F (36.6 C) (Oral)   Ht 5' 7"$  (1.702 m)   Wt 137 lb (62.1 kg)   SpO2 96%   BMI 21.46 kg/m     Physical Exam Vitals reviewed.  Constitutional:      General: She is not in acute distress.    Appearance: Normal appearance.  Cardiovascular:     Rate and Rhythm: Normal rate and regular rhythm.     Pulses: Normal pulses.     Heart sounds: Normal heart sounds. No murmur heard. Pulmonary:     Effort: Pulmonary effort is normal. No respiratory distress.   Neurological:     General: No focal deficit present.     Mental Status: She is alert and oriented to person, place, and time. Mental status is at baseline.     Cranial Nerves: No cranial nerve deficit.  Psychiatric:        Mood and Affect: Mood and affect normal.        Behavior: Behavior normal.        Thought Content: Thought content normal.        Cognition and Memory: Memory normal.        Judgment: Judgment normal.     ASSESSMENT & PLAN:  1. Urinary incontinence, unspecified type Worse at night, will increase her Oxybutynin, encouraged to increase water intake but cut off earlier in the evening.  - oxybutynin (DITROPAN) 5 MG tablet; Take 1 tablet (5 mg total) by mouth 2 (two) times daily.  Dispense: 180 tablet; Refill: 1  2. Falls frequently She has had 2-3 falls in the last few weeks. No injuries, will order home health PT for safety. Reminded to remove throw rugs.  - Ambulatory referral to [facility]    COVID-19 Education: The signs and symptoms of COVID-19 were discussed with the patient and how to seek care for testing (follow up with PCP or arrange E-visit).  The importance of social distancing was discussed today.  Patient Risk:   After full review of this patients clinical status, I feel that they are at least moderate risk at this time.  Time:   Today, I have spent 13 minutes/ seconds with the patient with telehealth technology discussing above diagnoses.     Medication Adjustments/Labs and Tests Ordered: Current medicines are reviewed at length with the patient today.  Concerns regarding medicines are outlined above.   Tests Ordered: Orders Placed This Encounter  Procedures   Ambulatory referral to Home Health    Medication Changes: Meds ordered this encounter  Medications   oxybutynin (DITROPAN) 5 MG tablet    Sig: Take 1 tablet (5 mg total) by mouth 2 (two) times daily.    Dispense:  180 tablet    Refill:  1    Disposition:  Follow up  prn  Signed, [name], FNP

## 2022-03-19 ENCOUNTER — Other Ambulatory Visit: Payer: Self-pay

## 2022-03-19 DIAGNOSIS — R450 Nervousness: Secondary | ICD-10-CM

## 2022-03-19 MED ORDER — MAGNESIUM 250 MG PO TABS: ORAL_TABLET | ORAL | 1 refills | Status: DC

## 2022-04-01 DIAGNOSIS — J020 Streptococcal pharyngitis: Secondary | ICD-10-CM | POA: Diagnosis not present

## 2022-04-01 DIAGNOSIS — R531 Weakness: Secondary | ICD-10-CM | POA: Diagnosis not present

## 2022-04-01 DIAGNOSIS — Z20822 Contact with and (suspected) exposure to covid-19: Secondary | ICD-10-CM | POA: Diagnosis not present

## 2022-04-01 DIAGNOSIS — R918 Other nonspecific abnormal finding of lung field: Secondary | ICD-10-CM | POA: Diagnosis not present

## 2022-04-01 DIAGNOSIS — E7800 Pure hypercholesterolemia, unspecified: Secondary | ICD-10-CM | POA: Diagnosis not present

## 2022-04-01 DIAGNOSIS — R112 Nausea with vomiting, unspecified: Secondary | ICD-10-CM | POA: Diagnosis not present

## 2022-04-01 DIAGNOSIS — R1110 Vomiting, unspecified: Secondary | ICD-10-CM | POA: Diagnosis not present

## 2022-04-01 DIAGNOSIS — I10 Essential (primary) hypertension: Secondary | ICD-10-CM | POA: Diagnosis not present

## 2022-04-01 DIAGNOSIS — R109 Unspecified abdominal pain: Secondary | ICD-10-CM | POA: Diagnosis not present

## 2022-04-01 DIAGNOSIS — Z1152 Encounter for screening for COVID-19: Secondary | ICD-10-CM | POA: Diagnosis not present

## 2022-04-06 ENCOUNTER — Encounter: Payer: Self-pay | Admitting: Nurse Practitioner

## 2022-04-06 ENCOUNTER — Ambulatory Visit: Payer: Self-pay

## 2022-04-06 NOTE — Patient Outreach (Signed)
  Care Coordination   04/06/2022 Name: Tammy [name] MRN: [id number] DOB: 1933-06-05   Care Coordination Outreach Attempts:  An unsuccessful telephone outreach was attempted for a scheduled appointment today.  Follow Up Plan:  Additional outreach attempts will be made to offer the patient care coordination information and services.   Encounter Outcome:  No Answer   Care Coordination Interventions:  No, not indicated    Barb Merino, RN, BSN, CCM Care Management Coordinator [facility] Care Management Direct Phone: [phone]

## 2022-04-13 ENCOUNTER — Ambulatory Visit: Payer: Medicare Other | Admitting: Nurse Practitioner

## 2022-04-23 ENCOUNTER — Ambulatory Visit: Payer: Self-pay

## 2022-04-23 NOTE — Patient Instructions (Signed)
Visit Information  Thank you for taking time to visit with me today. Please don't hesitate to contact me if I can be of assistance to you.   Following are the goals we discussed today:   Goals Addressed             This Visit's Progress    To reduce fall risk       Care Coordination Interventions: Provided written and verbal education re: potential causes of falls and Fall prevention strategies Assessed for falls since last encounter Assessed patients knowledge of fall risk prevention secondary to previously provided education Advised patient to discuss any/all falls  with provider Educated niece [name] about [employer] for diagnostic measurements of patients feet and benefits of wearing good supportive shoes to help avoid falls           Our next appointment is by telephone on 06/23/22 at 12 PM  Please call the care guide team at [phone] if you need to cancel or reschedule your appointment.   If you are experiencing a Mental Health or [place] or need someone to talk to, please call 1-800-273-TALK (toll free, 24 hour hotline) go to [facility] [address] [place] [phone])  Patient verbalizes understanding of instructions and care plan provided today and agrees to view in [place]. Active MyChart status and patient understanding of how to access instructions and care plan via MyChart confirmed with patient.     Barb Merino, RN, BSN, CCM Care Management Coordinator [facility] Care Management  Direct Phone: [phone]

## 2022-04-23 NOTE — Patient Outreach (Signed)
  Care Coordination   Follow Up Visit Note   04/23/2022 Name: Tammy [name] MRN: [id number] DOB: 1933-10-01  Tammy [name] is a 89 y.o. year old female who sees [name], [place] for primary care. I spoke with niece Tammy [name] by phone today.  What matters to the patients health and wellness today?  Niece/caregiver [name] would like to help reduce the risk for falls.     Goals Addressed             This Visit's Progress    To reduce fall risk       Care Coordination Interventions: Completed successful outbound call to niece Tammy [name]  Provided written and verbal education re: potential causes of falls and Fall prevention strategies Assessed for falls since last encounter Assessed patients knowledge of fall risk prevention secondary to previously provided education Advised patient to discuss any/all falls  with provider Educated niece [name] about [employer] for diagnostic measurements of patients feet and benefits of wearing good supportive shoes to help avoid falls    Interventions Today    Flowsheet Row Most Recent Value  Chronic Disease   Chronic disease during today's visit Other  [Strep throat, falls]  General Interventions   General Interventions Discussed/Reviewed General Interventions Discussed, General Interventions Reviewed, Labs, Lipid Profile, Doctor Visits  Labs Kidney Function  Doctor Visits Discussed/Reviewed Doctor Visits Discussed, Doctor Visits Reviewed, PCP  Education Interventions   Education Provided Provided Education  Provided Verbal Education On Labs, Medication, When to see the doctor  Labs Reviewed Kidney Function, Lipid Profile  Pharmacy Interventions   Pharmacy Dicussed/Reviewed Pharmacy Topics Reviewed, Pharmacy Topics Discussed  Safety Interventions   Safety Discussed/Reviewed Safety Discussed, Safety Reviewed, Fall Risk          SDOH assessments and interventions completed:  No     Care Coordination Interventions:  Yes, provided    Follow up plan: Follow up call scheduled for 06/23/22 @12  PM    Encounter Outcome:  Pt. Visit Completed

## 2022-04-27 ENCOUNTER — Ambulatory Visit (INDEPENDENT_AMBULATORY_CARE_PROVIDER_SITE_OTHER): Payer: Medicare Other | Admitting: Nurse Practitioner

## 2022-04-27 ENCOUNTER — Encounter: Payer: Self-pay | Admitting: Nurse Practitioner

## 2022-04-27 VITALS — BP 126/60 | HR 65 | Temp 98.4°F | Ht 67.0 in | Wt 145.4 lb

## 2022-04-27 DIAGNOSIS — E782 Mixed hyperlipidemia: Secondary | ICD-10-CM

## 2022-04-27 DIAGNOSIS — R3981 Functional urinary incontinence: Secondary | ICD-10-CM

## 2022-04-27 DIAGNOSIS — Z23 Encounter for immunization: Secondary | ICD-10-CM

## 2022-04-27 DIAGNOSIS — F0280 Dementia in other diseases classified elsewhere without behavioral disturbance: Secondary | ICD-10-CM

## 2022-04-27 DIAGNOSIS — I10 Essential (primary) hypertension: Secondary | ICD-10-CM

## 2022-04-27 DIAGNOSIS — R296 Repeated falls: Secondary | ICD-10-CM | POA: Diagnosis not present

## 2022-04-27 DIAGNOSIS — Z79899 Other long term (current) drug therapy: Secondary | ICD-10-CM | POA: Diagnosis not present

## 2022-04-27 DIAGNOSIS — G301 Alzheimer's disease with late onset: Secondary | ICD-10-CM

## 2022-04-27 DIAGNOSIS — E2839 Other primary ovarian failure: Secondary | ICD-10-CM | POA: Diagnosis not present

## 2022-04-27 MED ORDER — DONEPEZIL HCL 10 MG PO TABS: 10.0000 mg | ORAL_TABLET | Freq: Every day | ORAL | 1 refills | Status: DC

## 2022-04-27 MED ORDER — AMLODIPINE-OLMESARTAN 10-20 MG PO TABS: 1.0000 | ORAL_TABLET | Freq: Every day | ORAL | 1 refills | Status: DC

## 2022-04-27 MED ORDER — CITALOPRAM HYDROBROMIDE 10 MG PO TABS: 10.0000 mg | ORAL_TABLET | Freq: Every day | ORAL | 1 refills | Status: DC

## 2022-04-27 MED ORDER — ATORVASTATIN CALCIUM 10 MG PO TABS: ORAL_TABLET | ORAL | 1 refills | Status: DC

## 2022-04-27 NOTE — Progress Notes (Signed)
I,Sheena H Holbrook,acting as a Education administrator for [name], FNP.,have documented all relevant documentation on the behalf of [name], FNP,as directed by  [name], FNP while in the presence of [name], [place].    Subjective:     Patient ID: Tammy [name] , female    DOB: 05-Aug-1933 , 89 y.o.   MRN: [id number]   Chief Complaint  Patient presents with   Medical Management of Chronic Issues    HPI  Patient presents today for follow up of hypertension, chronic falls (no recent falls), she is using wheelchair when out at appts or having to walk long distances and Alzheimer's.   She is having increasing reflux during the night. She does drink a good amount of tea. She may have peanut butter and crackers. She was eating chocolate chip cookies. She is taking her omeprazole at night.   Wt Readings from Last 3 Encounters: 04/27/22 : 145 lb 6.4 oz (66 kg) 03/15/22 : 137 lb (62.1 kg) 12/09/21 : 148 lb (67.1 kg)  She is here today with her niece Amador Cunas   Hypertension This is a chronic problem. The current episode started more than 1 year ago. The problem is unchanged. The problem is controlled. Pertinent negatives include no chest pain, palpitations or shortness of breath. Risk factors for coronary artery disease include sedentary lifestyle. The current treatment provides no improvement. There are no compliance problems.  There is no history of angina. There is no history of chronic renal disease.  Hyperlipidemia This is a chronic problem. The current episode started more than 1 year ago. The problem is controlled. She has no history of chronic renal disease. Pertinent negatives include no chest pain or shortness of breath. Current antihyperlipidemic treatment includes diet change. There are no compliance problems.  Risk factors for coronary artery disease include a sedentary lifestyle and dyslipidemia.     Past Medical History:  Diagnosis Date   Acid reflux    Glaucoma     High cholesterol    Hypertension    Lower GI bleed    Rectal bleeding 09/26/2015     Family History  Problem Relation Age of Onset   Hypertension Mother    Hypertension Sister    Hypertension Brother    Heart disease Brother      Current Outpatient Medications:    acetaminophen (TYLENOL) 500 MG tablet, Take 1 tablet (500 mg total) by mouth every 6 (six) hours as needed for mild pain., Disp: 90 tablet, Rfl: 1   amlodipine-olmesartan (AZOR) 10-20 MG tablet, Take 1 tablet by mouth daily., Disp: 90 tablet, Rfl: 1   atorvastatin (LIPITOR) 10 MG tablet, Take 1 tab by mouth at bedtime MWF, Disp: 45 tablet, Rfl: 1   bimatoprost (LUMIGAN) 0.03 % ophthalmic solution, Place 1 drop into both eyes at bedtime. (Patient not taking: Reported on 12/09/2021), Disp: , Rfl:    calcium-vitamin D (OSCAL WITH D) 500-200 MG-UNIT per tablet, Take 1 tablet by mouth daily., Disp: , Rfl:    citalopram (CELEXA) 10 MG tablet, Take 1 tablet (10 mg total) by mouth daily., Disp: 90 tablet, Rfl: 1   diclofenac Sodium (VOLTAREN) 1 % GEL, Apply 2 g topically 4 (four) times daily., Disp: 100 g, Rfl: 3   donepezil (ARICEPT) 10 MG tablet, Take 1 tablet (10 mg total) by mouth at bedtime., Disp: 90 tablet, Rfl: 1   hydrOXYzine (ATARAX) 25 MG tablet, TAKE 1 TABLET BY MOUTH ONCE DAILY AS NEEDED FOR AGITATION, Disp: 30 tablet,  Rfl: 0   Magnesium 250 MG TABS, Take 1 tablet by mouth with evening meal, Disp: 90 tablet, Rfl: 1   omeprazole (PRILOSEC) 20 MG capsule, Take 1 capsule (20 mg total) by mouth daily., Disp: 90 capsule, Rfl: 1   oxybutynin (DITROPAN) 5 MG tablet, Take 1 tablet (5 mg total) by mouth 2 (two) times daily., Disp: 180 tablet, Rfl: 1   Allergies  Allergen Reactions   Advil [Ibuprofen] Itching   Ciprofloxacin Other (See Comments)    REACTION: hives   Tramadol Other (See Comments)   Zolpidem Tartrate Other (See Comments)    REACTION: trembling   Unisom [Doxylamine] Swelling and Rash     Review of Systems   Constitutional: Negative.   Respiratory: Negative.  Negative for shortness of breath and wheezing.   Cardiovascular: Negative.  Negative for chest pain and palpitations.  Gastrointestinal: Negative.   Neurological: Negative.   Psychiatric/Behavioral: Negative.       Today's Vitals   04/27/22 1017  BP: 126/60  Pulse: 65  Temp: 98.4 F (36.9 C)  TempSrc: Oral  SpO2: 97%  Weight: 145 lb 6.4 oz (66 kg)  Height: 5\' 7"  (1.702 m)   Body mass index is 22.77 kg/m.   Objective:  Physical Exam Vitals reviewed.  Constitutional:      General: She is not in acute distress.    Appearance: Normal appearance.  Cardiovascular:     Rate and Rhythm: Normal rate and regular rhythm.     Pulses: Normal pulses.     Heart sounds: Normal heart sounds. No murmur heard. Pulmonary:     Effort: Pulmonary effort is normal. No respiratory distress.     Breath sounds: Normal breath sounds. No wheezing.  Musculoskeletal:        General: Normal range of motion.     Comments: She is in a wheelchair today  Neurological:     General: No focal deficit present.     Mental Status: She is alert and oriented to person, place, and time.     Cranial Nerves: No cranial nerve deficit.     Motor: No weakness.  Psychiatric:        Mood and Affect: Mood normal.        Behavior: Behavior normal.        Thought Content: Thought content normal.        Judgment: Judgment normal.         Assessment And Plan:     1. Essential hypertension Comments: Blood pressure is well controlled, Continue current medications - amlodipine-olmesartan (AZOR) 10-20 MG tablet; Take 1 tablet by mouth daily.  Dispense: 90 tablet; Refill: 1  2. Mixed hyperlipidemia Comments: Cholesterol levels are normal. Continue current medications - CMP14+EGFR - Lipid panel  3. Late onset Alzheimer's disease without behavioral disturbance Comments: Doing well, stable. Continue current medications  4. Functional urinary  incontinence Comments: she is doing better with the twice a day oxybutynin  5. Falls frequently Comments: No recent falls  6. Encounter for immunization Comments: Pneumonia 20 given in office. She is to get her shingrix at the pharmacy in 2 weeks. - Pneumococcal conjugate vaccine 20-valent (Prevnar 20)  7. Decreased estrogen level Comments: Sent new order to [facility] due to location, patient is to call and cancel appt with the [place]; Future  8. Other long term (current) drug therapy - CBC     Patient was given opportunity to ask questions. Patient verbalized understanding of the  plan and was able to repeat key elements of the plan. All questions were answered to their satisfaction.  [name], FNP   I, [name], FNP, have reviewed all documentation for this visit. The documentation on 04/27/22 for the exam, diagnosis, procedures, and orders are all accurate and complete.   IF YOU HAVE BEEN REFERRED TO A SPECIALIST, IT MAY TAKE 1-2 WEEKS TO SCHEDULE/PROCESS THE REFERRAL. IF YOU HAVE NOT HEARD FROM US/SPECIALIST IN TWO WEEKS, PLEASE GIVE [place] A CALL AT [phone].   THE PATIENT IS ENCOURAGED TO PRACTICE SOCIAL DISTANCING DUE TO THE COVID-19 PANDEMIC.

## 2022-04-28 LAB — CMP14+EGFR
ALT: 13 IU/L (ref 0–32)
AST: 13 IU/L (ref 0–40)
Albumin/Globulin Ratio: 1.6 (ref 1.2–2.2)
Albumin: 4.4 g/dL (ref 3.7–4.7)
Alkaline Phosphatase: 111 IU/L (ref 44–121)
BUN/Creatinine Ratio: 16 (ref 12–28)
BUN: 15 mg/dL (ref 8–27)
Bilirubin Total: 0.4 mg/dL (ref 0.0–1.2)
CO2: 22 mmol/L (ref 20–29)
Calcium: 9.7 mg/dL (ref 8.7–10.3)
Chloride: 107 mmol/L — ABNORMAL HIGH (ref 96–106)
Creatinine, Ser: 0.95 mg/dL (ref 0.57–1.00)
Globulin, Total: 2.7 g/dL (ref 1.5–4.5)
Glucose: 91 mg/dL (ref 70–99)
Potassium: 4.2 mmol/L (ref 3.5–5.2)
Sodium: 143 mmol/L (ref 134–144)
Total Protein: 7.1 g/dL (ref 6.0–8.5)
eGFR: 58 mL/min/{1.73_m2} — ABNORMAL LOW (ref >59)

## 2022-04-28 LAB — CBC
Hematocrit: 44.3 % (ref 34.0–46.6)
Hemoglobin: 13.9 g/dL (ref 11.1–15.9)
MCH: 26.7 pg (ref 26.6–33.0)
MCHC: 31.4 g/dL — ABNORMAL LOW (ref 31.5–35.7)
MCV: 85 fL (ref 79–97)
Platelets: 326 10*3/uL (ref 150–450)
RBC: 5.21 x10E6/uL (ref 3.77–5.28)
RDW: 13.3 % (ref 11.7–15.4)
WBC: 6.1 10*3/uL (ref 3.4–10.8)

## 2022-04-28 LAB — LIPID PANEL
Chol/HDL Ratio: 2.3 ratio (ref 0.0–4.4)
Cholesterol, Total: 160 mg/dL (ref 100–199)
HDL: 69 mg/dL (ref >39)
LDL Chol Calc (NIH): 77 mg/dL (ref 0–99)
Triglycerides: 72 mg/dL (ref 0–149)
VLDL Cholesterol Cal: 14 mg/dL (ref 5–40)

## 2022-05-04 ENCOUNTER — Other Ambulatory Visit: Payer: Self-pay | Admitting: Nurse Practitioner

## 2022-05-04 DIAGNOSIS — I10 Essential (primary) hypertension: Secondary | ICD-10-CM

## 2022-05-04 DIAGNOSIS — R450 Nervousness: Secondary | ICD-10-CM

## 2022-06-23 ENCOUNTER — Ambulatory Visit: Payer: Self-pay

## 2022-06-23 NOTE — Patient Outreach (Signed)
  Care Coordination   Follow Up Visit Note   06/23/2022 Name: Tammy [name] MRN: [id number] DOB: [birth year]  Tammy [name] is a 89 y.o. year old female who sees Arnette Felts, FNP for primary care. I spoke with niece Tammy [name] by phone today.  What matters to the patients health and wellness today?  Niece Tammy [name] would like assistance with getting her aunt placed in a SNF.     Goals Addressed             This Visit's Progress    To reduce fall risk       Care Coordination Interventions: Completed successful outbound call with POA and niece Tammy [name] Assessed for falls since last encounter Assessed patients knowledge of fall risk prevention secondary to previously provided education Advised patient to discuss any/all falls  with provider Determined patient continues to have soft falls, no injuries per niece since last contact however, patient is unable to lift feet normally contributing to falls Determined patient's dementia has advanced, she has become drastically less active and her urinary incontinence is more than the family can handle  Determined niece Tammy [name] feels it is time to have her aunt placed into a SNF, she is considering [facility] near he home and would like assistance with the FL2 and determined next steps Placed SW referral to Jenel Lucks LCSW via in basket message requesting outreach to niece Tammy [name] to further assist     Interventions Today    Flowsheet Row Most Recent Value  Chronic Disease   Chronic disease during today's visit Other  [falls, dementia, urinary incontinence]  General Interventions   General Interventions Discussed/Reviewed General Interventions Discussed, General Interventions Reviewed, Doctor Visits, Communication with  Doctor Visits Discussed/Reviewed Doctor Visits Reviewed, Doctor Visits Discussed, PCP  Communication with Social Work  Jenel Lucks LCSW]  Education Interventions   Education Provided Provided Education  Provided  Verbal Education On When to see the doctor  Mental Health Interventions   Mental Health Discussed/Reviewed Mental Health Discussed, Mental Health Reviewed, Other  [dementia]  Safety Interventions   Safety Discussed/Reviewed Safety Discussed, Safety Reviewed, Fall Risk, Home Safety  Home Safety Assistive Devices          SDOH assessments and interventions completed:  No     Care Coordination Interventions:  Yes, provided   Follow up plan: Referral made to LCSW Jenel Lucks to assist with placement  Follow up call scheduled for 08/23/22 @12 :45 PM    Encounter Outcome:  Pt. Visit Completed

## 2022-06-23 NOTE — Patient Instructions (Signed)
Visit Information  Thank you for taking time to visit with me today. Please don't hesitate to contact me if I can be of assistance to you.   Following are the goals we discussed today:   Goals Addressed             This Visit's Progress    To reduce fall risk       Care Coordination Interventions: Completed successful outbound call with POA and niece [name] Assessed for falls since last encounter Assessed patients knowledge of fall risk prevention secondary to previously provided education Advised patient to discuss any/all falls  with provider Determined patient continues to have soft falls, no injuries per niece since last contact however, patient is unable to lift feet normally contributing to falls Determined patient's dementia has advanced, she has become drastically less active and her urinary incontinence is more than the family can handle  Determined niece [name] feels it is time to have her aunt placed into a SNF, she is considering [facility] near he home and would like assistance with the [facility] and determined next steps Placed SW referral to Jenel Lucks LCSW via in basket message requesting outreach to niece [name] to further assist         Our next appointment is by telephone on 08/23/22 at 12:45 PM  Please call the care guide team at [phone] if you need to cancel or reschedule your appointment.   If you are experiencing a Mental Health or Behavioral Health Crisis or need someone to talk to, please call 1-800-273-TALK (toll free, 24 hour hotline) go to [facility] [address] [place] [phone])  Patient verbalizes understanding of instructions and care plan provided today and agrees to view in MyChart. Active MyChart status and patient understanding of how to access instructions and care plan via MyChart confirmed with patient.     Delsa Sale, RN, BSN, CCM Care Management Coordinator [facility] Care Management   Direct Phone: [phone]

## 2022-07-20 ENCOUNTER — Other Ambulatory Visit: Payer: Medicare Other

## 2022-08-23 ENCOUNTER — Ambulatory Visit: Payer: Self-pay

## 2022-08-23 NOTE — Patient Outreach (Signed)
  Care Coordination   08/23/2022 Name: Tammy [name] MRN: [id number] DOB: 1933/11/30   Care Coordination Outreach Attempts:  An unsuccessful telephone outreach was attempted for a scheduled appointment today.  Follow Up Plan:  Additional outreach attempts will be made to offer the patient care coordination information and services.   Encounter Outcome:  Pt. Request to Call Back   Care Coordination Interventions:  No, not indicated    Delsa Sale, RN, BSN, CCM Care Management Coordinator [facility] Care Management  Direct Phone: [phone]

## 2022-08-24 ENCOUNTER — Other Ambulatory Visit: Payer: Self-pay | Admitting: Nurse Practitioner

## 2022-08-24 DIAGNOSIS — R32 Unspecified urinary incontinence: Secondary | ICD-10-CM

## 2022-08-31 ENCOUNTER — Ambulatory Visit: Payer: Medicare Other | Admitting: Nurse Practitioner

## 2022-09-03 ENCOUNTER — Ambulatory Visit: Payer: Self-pay

## 2022-09-03 NOTE — Patient Outreach (Signed)
  Care Coordination   09/03/2022 Name: Tammy [name] MRN: [id number] DOB: 12-27-1933   Care Coordination Outreach Attempts:  An unsuccessful telephone outreach was attempted for a scheduled appointment today.  Follow Up Plan:  Additional outreach attempts will be made to offer the patient care coordination information and services.   Encounter Outcome:  No Answer   Care Coordination Interventions:  No, not indicated    Delsa Sale, RN, BSN, CCM Care Management Coordinator [facility] Care Management  Direct Phone: [phone]

## 2022-09-22 ENCOUNTER — Telehealth: Payer: Self-pay | Admitting: *Deleted

## 2022-09-22 NOTE — Progress Notes (Signed)
  Care Coordination Note  09/22/2022 Name: EMARIE ZIEGER MRN: [id number] DOB: March 05, 1933  DEIANIRA CLETO is a 89 y.o. year old female who is a primary care patient of Arnette Felts, FNP and is actively engaged with the care management team. I reached out to Darrelyn Hillock by phone today to assist with re-scheduling a follow up visit with the RN Case Manager  Follow up plan: Unsuccessful telephone outreach attempt made. A HIPAA compliant phone message was left for the patient providing contact information and requesting a return call.   [facility]  Care Coordination Care Guide  Direct Dial: [phone]

## 2022-09-29 NOTE — Progress Notes (Signed)
  Care Coordination Note  09/29/2022 Name: Tammy [name] MRN: [id number] DOB: September 30, 1933  Tammy [name] is a 89 y.o. year old female who is a primary care patient of Arnette Felts, FNP and is actively engaged with the care management team. I reached out to Darrelyn Hillock by phone today to assist with re-scheduling a follow up visit with the RN Case Manager  Follow up plan: Telephone appointment with care management team member scheduled for:10/25/22  [facility] Coordination Care Guide  Direct Dial: [phone]

## 2022-10-04 ENCOUNTER — Other Ambulatory Visit: Payer: Self-pay | Admitting: Nurse Practitioner

## 2022-10-04 DIAGNOSIS — R450 Nervousness: Secondary | ICD-10-CM

## 2022-10-19 ENCOUNTER — Encounter: Payer: Self-pay | Admitting: Nurse Practitioner

## 2022-10-20 ENCOUNTER — Telehealth: Payer: Self-pay | Admitting: Licensed Clinical Social Worker

## 2022-10-20 NOTE — Patient Outreach (Addendum)
Care Coordination   Initial Visit Note   10/20/2022 Name: Tammy [name] MRN: [id number] DOB: [birth year]  Tammy [name] is a 89 y.o. year old female who sees Tammy Felts, FNP for primary care. I spoke with  Tammy [name]'s niece, Tammy [name], by phone today.  What matters to the patients health and wellness today?  Schedule initial LCSW appt    SDOH assessments and interventions completed:  No     Care Coordination Interventions:  Yes, provided  Interventions Today    Flowsheet Row Most Recent Value  Chronic Disease   Chronic disease during today's visit Hypertension (HTN), Other  [Alzheimer's Disease]  General Interventions   General Interventions Discussed/Reviewed General Interventions Discussed, Communication with  [LCSW introduced self and explained role in complex care coordination services. Pt's niece agreed to schedule initial appt]  Communication with RN, Social Work, PCP/Specialists  [LCSW collaborated with care coordination team and discussed pt background, barriers of care, and/or current needs. Informed PCP of scheduled appt]       Follow up plan: Follow up call scheduled for 10/22/22    Encounter Outcome:  Patient Visit Completed   Tammy [name], MSW, LCSW [facility] Care Management [facility]  [facility] [name] Phone [phone] 11:24 AM

## 2022-10-20 NOTE — Patient Instructions (Signed)
Visit Information  Thank you for taking time to visit with me today. Please don't hesitate to contact me if I can be of assistance to you.   Following are the goals we discussed today:   Goals Addressed   None     Our next appointment is by telephone on 09/27 at 9 AM  Please call the care guide team at [phone] if you need to cancel or reschedule your appointment.   If you are experiencing a Mental Health or Behavioral Health Crisis or need someone to talk to, please call the Suicide and Crisis Lifeline: 988 call 911   Patient verbalizes understanding of instructions and care plan provided today and agrees to view in MyChart. Active MyChart status and patient understanding of how to access instructions and care plan via MyChart confirmed with patient.     Jenel Lucks, MSW, LCSW [facility] Care Management [place]  [facility] [email or web address] Phone [phone] 11:24 AM

## 2022-10-22 ENCOUNTER — Ambulatory Visit: Payer: Self-pay | Admitting: Licensed Clinical Social Worker

## 2022-10-22 ENCOUNTER — Other Ambulatory Visit: Payer: Self-pay | Admitting: Nurse Practitioner

## 2022-10-22 DIAGNOSIS — I10 Essential (primary) hypertension: Secondary | ICD-10-CM

## 2022-10-25 ENCOUNTER — Ambulatory Visit: Payer: Self-pay

## 2022-10-25 ENCOUNTER — Encounter: Payer: Self-pay | Admitting: Nurse Practitioner

## 2022-10-25 ENCOUNTER — Telehealth (INDEPENDENT_AMBULATORY_CARE_PROVIDER_SITE_OTHER): Payer: Medicare Other | Admitting: Nurse Practitioner

## 2022-10-25 VITALS — BP 126/74 | HR 69 | Temp 99.1°F

## 2022-10-25 DIAGNOSIS — I10 Essential (primary) hypertension: Secondary | ICD-10-CM

## 2022-10-25 DIAGNOSIS — G301 Alzheimer's disease with late onset: Secondary | ICD-10-CM | POA: Diagnosis not present

## 2022-10-25 DIAGNOSIS — F0280 Dementia in other diseases classified elsewhere without behavioral disturbance: Secondary | ICD-10-CM | POA: Diagnosis not present

## 2022-10-25 NOTE — Assessment & Plan Note (Signed)
She is now staying in the bed and has decreased appetite. Continues to have memory challenges. Will refer to hospice for consultation.

## 2022-10-25 NOTE — Patient Instructions (Signed)
Visit Information  Thank you for taking time to visit with me today. Please don't hesitate to contact me if I can be of assistance to you.   Following are the goals we discussed today:   Goals Addressed             This Visit's Progress    Obtain Support Resources-Caregiver Strain   On track    Activities and task to complete in order to accomplish goals.   Keep all upcoming appointments discussed today Continue with compliance of taking medication prescribed by Doctor Implement healthy coping skills discussed to assist with management of symptoms Continue working with [facility] care team to assist with goals identified Follow up with Department of Social Services          Our next appointment is by telephone on 10/14 at 9:30 am  Please call the care guide team at [phone] if you need to cancel or reschedule your appointment.   If you are experiencing a Mental Health or Behavioral Health Crisis or need someone to talk to, please call the Suicide and Crisis Lifeline: 988 call 911   Patient verbalizes understanding of instructions and care plan provided today and agrees to view in MyChart. Active MyChart status and patient understanding of how to access instructions and care plan via MyChart confirmed with patient.     Jenel Lucks, MSW, LCSW [facility] Care Management [place]  [facility] [email or web address] Phone [phone] 4:54 PM

## 2022-10-25 NOTE — Progress Notes (Signed)
Virtual Visit via MyChart   This visit type was conducted due to national recommendations for restrictions regarding the COVID-19 Pandemic (e.g. social distancing) in an effort to limit this patient's exposure and mitigate transmission in our community.  Due to her co-morbid illnesses, this patient is at least at moderate risk for complications without adequate follow up.  This format is felt to be most appropriate for this patient at this time.  All issues noted in this document were discussed and addressed.  A limited physical exam was performed with this format.    This visit type was conducted due to national recommendations for restrictions regarding the COVID-19 Pandemic (e.g. social distancing) in an effort to limit this patient's exposure and mitigate transmission in our community.  Patients identity confirmed using two different identifiers.  This format is felt to be most appropriate for this patient at this time.  All issues noted in this document were discussed and addressed.  No physical exam was performed (except for noted visual exam findings with Video Visits).    Date:  10/25/2022   ID:  Tammy [name], DOB 1933-04-12, MRN [id number]  Patient Location:  Home - spoke with niece Tammy [name] and patient Tammy [name]  Provider location:   Office    Chief Complaint:  decline in health  History of Present Illness:    Tammy [name] is a 89 y.o. female who presents via video conferencing for a telehealth visit today.    The patient does not have symptoms concerning for COVID-19 infection (fever, chills, cough, or new shortness of breath).   Patient presents today for a referral to hospice, patient's niece reports the place on here bottom has healed mostly.  Since her last visit she is not wanting to get out of bed. She has two open areas on her buttocks, her niece had applied a duoderm and has since healed. She has had a decrease in appetite. She is not drinking a lot of  fluids/water. She does know who she is, able to respond sometimes appropriately and sometimes will repeat in short period of time. Niece reports she has lost weight in her legs, neck and her clothes are fitting more loosely. She knows her location, unsure of the date, knows my name but not the president. She is unable to recite her birthday.      Past Medical History:  Diagnosis Date   Acid reflux    Glaucoma    High cholesterol    Hypertension    Lower GI bleed    Rectal bleeding 09/26/2015   Past Surgical History:  Procedure Laterality Date   ABDOMINAL HYSTERECTOMY     COLONOSCOPY N/A 09/30/2015   Procedure: COLONOSCOPY;  Surgeon: Tammy Hawking, MD;  Location: [facility];  Service: Endoscopy;  Laterality: N/A;   JOINT REPLACEMENT     REVISION TOTAL HIP ARTHROPLASTY Bilateral    2011 and 2012     Current Meds  Medication Sig   acetaminophen (TYLENOL) 500 MG tablet Take 1 tablet (500 mg total) by mouth every 6 (six) hours as needed for mild pain.   amlodipine-olmesartan (AZOR) 10-20 MG tablet Take 1 tablet by mouth daily.   atorvastatin (LIPITOR) 10 MG tablet Take 1 tab by mouth at bedtime MWF   calcium-vitamin D (OSCAL WITH D) 500-200 MG-UNIT per tablet Take 1 tablet by mouth daily.   citalopram (CELEXA) 10 MG tablet Take 1 tablet (10 mg total) by mouth daily.   diclofenac Sodium (VOLTAREN) 1 %  GEL Apply 2 g topically 4 (four) times daily.   donepezil (ARICEPT) 10 MG tablet Take 1 tablet (10 mg total) by mouth at bedtime.   GNP MAGNESIUM OXIDE 250 MG TABS TAKE ONE TABLET BY MOUTH AT BEDTIME RX STATES WITH SUPPER   hydrOXYzine (ATARAX) 25 MG tablet TAKE 1 TABLET BY MOUTH ONCE DAILY AS NEEDED FOR AGITATION   Magnesium 250 MG TABS TAKE ONE TABLET BY MOUTH AT BEDTIME RX STATES WITH SUPPER   omeprazole (PRILOSEC) 20 MG capsule TAKE ONE CAPSULE BY MOUTH AT BEDTIME   oxybutynin (DITROPAN) 5 MG tablet TAKE ONE TABLET BY MOUTH TWICE DAILY     Allergies:   Advil [ibuprofen], Ciprofloxacin,  Tramadol, Zolpidem tartrate, and Unisom [doxylamine]   Social History   Tobacco Use   Smoking status: Never   Smokeless tobacco: Never  Vaping Use   Vaping status: Never Used  Substance Use Topics   Alcohol use: No   Drug use: No     Family Hx: The patient's family history includes Heart disease in her brother; Hypertension in her brother, mother, and sister.  ROS:   Please see the history of present illness.    Review of Systems  Constitutional:  Positive for malaise/fatigue and weight loss.  Respiratory: Negative.    Cardiovascular: Negative.   Neurological: Negative.   Psychiatric/Behavioral:  Positive for memory loss.     All other systems reviewed and are negative.   Labs/Other Tests and Data Reviewed:    Recent Labs: 04/27/2022: ALT 13; BUN 15; Creatinine, Ser 0.95; Hemoglobin 13.9; Platelets 326; Potassium 4.2; Sodium 143   Recent Lipid Panel Lab Results  Component Value Date/Time   CHOL 160 04/27/2022 10:56 AM   TRIG 72 04/27/2022 10:56 AM   HDL 69 04/27/2022 10:56 AM   CHOLHDL 2.3 04/27/2022 10:56 AM   LDLCALC 77 04/27/2022 10:56 AM    Wt Readings from Last 3 Encounters:  04/27/22 145 lb 6.4 oz (66 kg)  03/15/22 137 lb (62.1 kg)  12/09/21 148 lb (67.1 kg)     Exam:    Vital Signs:  BP 126/74   Pulse 69   Temp 99.1 F (37.3 C) (Oral)   SpO2 95%     Physical Exam Vitals reviewed.  Constitutional:      General: She is not in acute distress.    Appearance: Normal appearance.     Comments: Appears fatigued  Pulmonary:     Effort: Pulmonary effort is normal. No respiratory distress.  Neurological:     General: No focal deficit present.     Mental Status: She is alert and oriented to person, place, and time. Mental status is at baseline.     Cranial Nerves: No cranial nerve deficit.  Psychiatric:        Attention and Perception: Attention normal.        Mood and Affect: Mood normal. Affect is flat.        Speech: Speech normal.         Behavior: Behavior normal.        Thought Content: Thought content normal.        Cognition and Memory: Memory is impaired. She exhibits impaired recent memory.        Judgment: Judgment normal.     ASSESSMENT & PLAN:    Problem List Items Addressed This Visit       Cardiovascular and Mediastinum   Essential hypertension    Blood pressure is controlled, continue current medications  Relevant Orders   Ambulatory referral to Hospice     Nervous and Auditory   Late onset Alzheimer's disease without behavioral disturbance (HCC) - Primary    She is now staying in the bed and has decreased appetite. Continues to have memory challenges. Will refer to hospice for consultation.       Relevant Orders   Ambulatory referral to Hospice     COVID-19 Education: The signs and symptoms of COVID-19 were discussed with the patient and how to seek care for testing (follow up with PCP or arrange E-visit).  The importance of social distancing was discussed today.  Patient Risk:   After full review of this patients clinical status, I feel that they are at least moderate risk at this time.  Time:   Today, I have spent 10.50 minutes/ seconds with the patient with telehealth technology discussing above diagnoses.     Medication Adjustments/Labs and Tests Ordered: Current medicines are reviewed at length with the patient today.  Concerns regarding medicines are outlined above.   Tests Ordered: Orders Placed This Encounter  Procedures   Ambulatory referral to Hospice    Medication Changes: No orders of the defined types were placed in this encounter.   Disposition:  Follow up prn  Signed, Arnette Felts, FNP

## 2022-10-25 NOTE — Patient Outreach (Signed)
  Care Coordination   Initial Visit Note   10/22/2022 Name: Tammy [name] MRN: [id number] DOB: February 17, 1933  Tammy [name] is a 89 y.o. year old female who sees Arnette Felts, FNP for primary care. I spoke with  Tammy [name]'s niece, [name], by phone today.  What matters to the patients health and wellness today?  Levels of Care and Caregiver Strain    Goals Addressed             This Visit's Progress    Obtain Support Resources-Caregiver Strain   On track    Activities and task to complete in order to accomplish goals.   Keep all upcoming appointments discussed today Continue with compliance of taking medication prescribed by Doctor Implement healthy coping skills discussed to assist with management of symptoms Continue working with [facility] care team to assist with goals identified Follow up with Department of Social Services          SDOH assessments and interventions completed:  No     Care Coordination Interventions:  Yes, provided  Interventions Today    Flowsheet Row Most Recent Value  Chronic Disease   Chronic disease during today's visit Other  [Late Onset Alzeheimer's Disease]  General Interventions   General Interventions Discussed/Reviewed General Interventions Discussed, Community Resources, Level of Care  Level of Care Personal Care Services, Adult Daycare, Applications, Assisted Living  Applications Medicaid  Education Interventions   Applications Medicaid  Mental Health Interventions   Mental Health Discussed/Reviewed Mental Health Discussed, Coping Strategies, Anxiety, Depression, Grief and Loss  Nutrition Interventions   Nutrition Discussed/Reviewed Nutrition Discussed  Pharmacy Interventions   Pharmacy Dicussed/Reviewed Pharmacy Topics Discussed, Medication Adherence  Safety Interventions   Safety Discussed/Reviewed Safety Discussed, Home Safety, Fall Risk       Follow up plan: Follow up call scheduled for 2-4 weeks    Encounter Outcome:   Patient Visit Completed   Jenel Lucks, MSW, LCSW [facility] Care Management [facility]  [facility] [email or web address] Phone [phone] 4:53 PM

## 2022-10-25 NOTE — Assessment & Plan Note (Signed)
Blood pressure is controlled, continue current medications

## 2022-10-26 DIAGNOSIS — F0280 Dementia in other diseases classified elsewhere without behavioral disturbance: Secondary | ICD-10-CM | POA: Diagnosis not present

## 2022-10-26 DIAGNOSIS — M1990 Unspecified osteoarthritis, unspecified site: Secondary | ICD-10-CM | POA: Diagnosis not present

## 2022-10-26 DIAGNOSIS — I10 Essential (primary) hypertension: Secondary | ICD-10-CM | POA: Diagnosis not present

## 2022-10-26 DIAGNOSIS — K219 Gastro-esophageal reflux disease without esophagitis: Secondary | ICD-10-CM | POA: Diagnosis not present

## 2022-10-26 DIAGNOSIS — H409 Unspecified glaucoma: Secondary | ICD-10-CM | POA: Diagnosis not present

## 2022-10-26 DIAGNOSIS — K641 Second degree hemorrhoids: Secondary | ICD-10-CM | POA: Diagnosis not present

## 2022-10-26 DIAGNOSIS — G301 Alzheimer's disease with late onset: Secondary | ICD-10-CM | POA: Diagnosis not present

## 2022-10-26 DIAGNOSIS — E785 Hyperlipidemia, unspecified: Secondary | ICD-10-CM | POA: Diagnosis not present

## 2022-10-26 DIAGNOSIS — D649 Anemia, unspecified: Secondary | ICD-10-CM | POA: Diagnosis not present

## 2022-10-26 NOTE — Patient Instructions (Signed)
Visit Information  Thank you for taking time to visit with me today. Please don't hesitate to contact me if I can be of assistance to you.   Following are the goals we discussed today:   Goals Addressed             This Visit's Progress    To receive Hospice for comfort care       Care Coordination Interventions: Completed successful outbound call with patient's POA and niece [name] Evaluation of current treatment plan related to dementia w/worsening physical and mental decline and patient's adherence to plan as established by provider Determined patient is now bed bound and has developed a sacral bedsore, she is requiring 24/7 care and continues to live with her sister Florentina Addison  Reviewed and discussed with niece [name] level of care and home safety concerns Discussed [name] is actively working with Jenel Lucks LCSW for assistance and guidance for caregiver options and or placement Educated [name] about Hospice Care and determined [name] would prefer to bring Hospice in for comfort care if eligible Collaborated with PCP provider Arnette Felts FNP, she is agreeable to completing a video visit with patient and niece [name], she will place the referral for Hospice, notified Jasmine LCSW of the plan to initiate Hospice  Educated niece [name] on next steps and encouraged her to contact this RN if needed for questions or concerns Discussed plans with patient for ongoing care coordination follow up and provided patient with direct contact information for nurse care coordinator Active listening / Reflection utilized  Emotional Support Provided Caregiver stress acknowledged  Verbalization of feelings encouraged          Our next appointment is by telephone on 11/04/22 at 2:00 PM  Please call the care guide team at [phone] if you need to cancel or reschedule your appointment.   If you are experiencing a Mental Health or Behavioral Health Crisis or need someone to talk to, please call  1-800-273-TALK (toll free, 24 hour hotline)  Patient verbalizes understanding of instructions and care plan provided today and agrees to view in MyChart. Active MyChart status and patient understanding of how to access instructions and care plan via MyChart confirmed with patient.     Delsa Sale RN BSN CCM [place]  [facility], [facility] Nurse Care Coordinator  Direct Dial: [phone] Website: [email or web address]

## 2022-10-26 NOTE — Patient Outreach (Signed)
  Care Coordination   Follow Up Visit Note   10/25/2022 Name: Tammy [name] MRN: [id number] DOB: Nov 26, 1933  Tammy [name] is a 89 y.o. year old female who sees Arnette Felts, FNP for primary care. I spoke with patient's POA and niece [name] by phone today.  What matters to the patients health and wellness today?  Niece [name] would like to have Hospice Care in place for the patient.      Goals Addressed             This Visit's Progress    To receive Hospice for comfort care       Care Coordination Interventions: Completed successful outbound call with patient's POA and niece [name] Evaluation of current treatment plan related to dementia w/worsening physical and mental decline and patient's adherence to plan as established by provider Determined patient is now bed bound and has developed a sacral bedsore, she is requiring 24/7 care and continues to live with her sister Florentina Addison  Reviewed and discussed with niece [name] level of care and home safety concerns Discussed [name] is actively working with Jenel Lucks LCSW for assistance and guidance for caregiver options and or placement Educated [name] about Hospice Care and determined [name] would prefer to bring Hospice in for comfort care if eligible Collaborated with PCP provider Arnette Felts FNP, she is agreeable to completing a video visit with patient and niece [name], she will place the referral for Hospice, notified Jasmine LCSW of the plan to initiate Hospice  Educated niece [name] on next steps and encouraged her to contact this RN if needed for questions or concerns Discussed plans with patient for ongoing care coordination follow up and provided patient with direct contact information for nurse care coordinator Active listening / Reflection utilized  Emotional Support Provided Caregiver stress acknowledged  Verbalization of feelings encouraged      Interventions Today    Flowsheet Row Most Recent Value  Chronic  Disease   Chronic disease during today's visit Other  [impaired physical mobility,  skin breakdown,  dementia]  General Interventions   General Interventions Discussed/Reviewed General Interventions Discussed, General Interventions Reviewed, Doctor Visits, Communication with, Level of Care  Doctor Visits Discussed/Reviewed Doctor Visits Reviewed, Doctor Visits Discussed, PCP  Communication with Social Work, PCP/Specialists  [Jasmine Jay Schlichter,  PCP Arnette Felts FNP]  Education Interventions   Education Provided Provided Education  Provided Verbal Education On When to see the doctor, Mental Health/Coping with Illness  Mental Health Interventions   Mental Health Discussed/Reviewed Mental Health Discussed, Mental Health Reviewed, Refer to Social Work for counseling, Refer to Social Work for resources, Landscape architect dementia]  Refer to Social Work for resources regarding Assisted Living/Skilled Office manager Interventions   Safety Discussed/Reviewed Financial risk analyst, Field seismologist Reviewed, Art gallery manager Devices  Advanced Directive Interventions   Advanced Directives Discussed/Reviewed End of Life, Advanced Care Planning  End of Life Hospice  [educated niece [name] regarding Hospice Care]          SDOH assessments and interventions completed:  No     Care Coordination Interventions:  Yes, provided   Follow up plan: Follow up call scheduled for 11/04/22 @2 :00 PM     Encounter Outcome:  Patient Visit Completed

## 2022-10-27 ENCOUNTER — Other Ambulatory Visit: Payer: Self-pay

## 2022-10-27 ENCOUNTER — Encounter: Payer: Self-pay | Admitting: Licensed Clinical Social Worker

## 2022-10-27 DIAGNOSIS — E785 Hyperlipidemia, unspecified: Secondary | ICD-10-CM | POA: Diagnosis not present

## 2022-10-27 DIAGNOSIS — M1990 Unspecified osteoarthritis, unspecified site: Secondary | ICD-10-CM | POA: Diagnosis not present

## 2022-10-27 DIAGNOSIS — I10 Essential (primary) hypertension: Secondary | ICD-10-CM

## 2022-10-27 DIAGNOSIS — K219 Gastro-esophageal reflux disease without esophagitis: Secondary | ICD-10-CM | POA: Diagnosis not present

## 2022-10-27 DIAGNOSIS — G301 Alzheimer's disease with late onset: Secondary | ICD-10-CM | POA: Diagnosis not present

## 2022-10-27 DIAGNOSIS — F0280 Dementia in other diseases classified elsewhere without behavioral disturbance: Secondary | ICD-10-CM | POA: Diagnosis not present

## 2022-10-27 MED ORDER — AMLODIPINE-OLMESARTAN 10-20 MG PO TABS: 1.0000 | ORAL_TABLET | Freq: Every day | ORAL | 1 refills | Status: DC

## 2022-10-27 MED ORDER — OMEPRAZOLE 20 MG PO CPDR
20.0000 mg | DELAYED_RELEASE_CAPSULE | Freq: Every day | ORAL | 1 refills | Status: DC
Start: 2022-10-27 — End: 2023-04-21

## 2022-10-27 MED ORDER — CITALOPRAM HYDROBROMIDE 10 MG PO TABS: 10.0000 mg | ORAL_TABLET | Freq: Every day | ORAL | 1 refills | Status: DC

## 2022-10-28 DIAGNOSIS — K219 Gastro-esophageal reflux disease without esophagitis: Secondary | ICD-10-CM | POA: Diagnosis not present

## 2022-10-28 DIAGNOSIS — I10 Essential (primary) hypertension: Secondary | ICD-10-CM | POA: Diagnosis not present

## 2022-10-28 DIAGNOSIS — E785 Hyperlipidemia, unspecified: Secondary | ICD-10-CM | POA: Diagnosis not present

## 2022-10-28 DIAGNOSIS — F0280 Dementia in other diseases classified elsewhere without behavioral disturbance: Secondary | ICD-10-CM | POA: Diagnosis not present

## 2022-10-28 DIAGNOSIS — M1990 Unspecified osteoarthritis, unspecified site: Secondary | ICD-10-CM | POA: Diagnosis not present

## 2022-10-28 DIAGNOSIS — G301 Alzheimer's disease with late onset: Secondary | ICD-10-CM | POA: Diagnosis not present

## 2022-10-28 NOTE — Patient Outreach (Signed)
  Care Coordination   Multidisciplinary Case Review Note    10/27/2022 Name: ELANORA QUIN MRN: [id number] DOB: 05/20/1933  MIGUEL MEDAL is a 89 y.o. year old female who sees Arnette Felts, FNP for primary care.  The  multidisciplinary care team met today to review patient care needs and barriers.    Care Coordination Interventions: Multidisciplinary case discussion to review patient ongoing care coordination needs  Patient to be engaged ongoing by RN Care Manager Angel Little and LCSW Jenel Lucks to address disease management and social support needs   SDOH assessments and interventions completed:  No     Care Coordination Interventions Activated:  Yes   Care Coordination Interventions:  Yes, provided   Follow up plan: Follow up call scheduled for 1-2 weeks    Multidisciplinary Team Attendees:   Delsa Sale, RNCM Jenel Lucks, LCSW Roshanda Florance, [facility] RNCM  Scribe for Multidisciplinary Case Review:   Jenel Lucks, MSW, LCSW [facility] Care Management [facility]  [facility] [email or web address] Phone [phone] 12:43 PM

## 2022-10-29 ENCOUNTER — Encounter: Payer: Medicare Other | Admitting: Licensed Clinical Social Worker

## 2022-10-29 DIAGNOSIS — M1990 Unspecified osteoarthritis, unspecified site: Secondary | ICD-10-CM | POA: Diagnosis not present

## 2022-10-29 DIAGNOSIS — K219 Gastro-esophageal reflux disease without esophagitis: Secondary | ICD-10-CM | POA: Diagnosis not present

## 2022-10-29 DIAGNOSIS — F0280 Dementia in other diseases classified elsewhere without behavioral disturbance: Secondary | ICD-10-CM | POA: Diagnosis not present

## 2022-10-29 DIAGNOSIS — I10 Essential (primary) hypertension: Secondary | ICD-10-CM | POA: Diagnosis not present

## 2022-10-29 DIAGNOSIS — G301 Alzheimer's disease with late onset: Secondary | ICD-10-CM | POA: Diagnosis not present

## 2022-10-29 DIAGNOSIS — E785 Hyperlipidemia, unspecified: Secondary | ICD-10-CM | POA: Diagnosis not present

## 2022-10-30 DIAGNOSIS — E785 Hyperlipidemia, unspecified: Secondary | ICD-10-CM | POA: Diagnosis not present

## 2022-10-30 DIAGNOSIS — F0280 Dementia in other diseases classified elsewhere without behavioral disturbance: Secondary | ICD-10-CM | POA: Diagnosis not present

## 2022-10-30 DIAGNOSIS — K219 Gastro-esophageal reflux disease without esophagitis: Secondary | ICD-10-CM | POA: Diagnosis not present

## 2022-10-30 DIAGNOSIS — I10 Essential (primary) hypertension: Secondary | ICD-10-CM | POA: Diagnosis not present

## 2022-10-30 DIAGNOSIS — G301 Alzheimer's disease with late onset: Secondary | ICD-10-CM | POA: Diagnosis not present

## 2022-10-30 DIAGNOSIS — M1990 Unspecified osteoarthritis, unspecified site: Secondary | ICD-10-CM | POA: Diagnosis not present

## 2022-10-31 DIAGNOSIS — F0280 Dementia in other diseases classified elsewhere without behavioral disturbance: Secondary | ICD-10-CM | POA: Diagnosis not present

## 2022-10-31 DIAGNOSIS — M1990 Unspecified osteoarthritis, unspecified site: Secondary | ICD-10-CM | POA: Diagnosis not present

## 2022-10-31 DIAGNOSIS — E785 Hyperlipidemia, unspecified: Secondary | ICD-10-CM | POA: Diagnosis not present

## 2022-10-31 DIAGNOSIS — G301 Alzheimer's disease with late onset: Secondary | ICD-10-CM | POA: Diagnosis not present

## 2022-10-31 DIAGNOSIS — I10 Essential (primary) hypertension: Secondary | ICD-10-CM | POA: Diagnosis not present

## 2022-10-31 DIAGNOSIS — K219 Gastro-esophageal reflux disease without esophagitis: Secondary | ICD-10-CM | POA: Diagnosis not present

## 2022-11-01 ENCOUNTER — Ambulatory Visit: Payer: Medicare Other | Admitting: Nurse Practitioner

## 2022-11-01 DIAGNOSIS — E785 Hyperlipidemia, unspecified: Secondary | ICD-10-CM | POA: Diagnosis not present

## 2022-11-01 DIAGNOSIS — G301 Alzheimer's disease with late onset: Secondary | ICD-10-CM | POA: Diagnosis not present

## 2022-11-01 DIAGNOSIS — I10 Essential (primary) hypertension: Secondary | ICD-10-CM | POA: Diagnosis not present

## 2022-11-01 DIAGNOSIS — M1990 Unspecified osteoarthritis, unspecified site: Secondary | ICD-10-CM | POA: Diagnosis not present

## 2022-11-01 DIAGNOSIS — F0280 Dementia in other diseases classified elsewhere without behavioral disturbance: Secondary | ICD-10-CM | POA: Diagnosis not present

## 2022-11-01 DIAGNOSIS — K219 Gastro-esophageal reflux disease without esophagitis: Secondary | ICD-10-CM | POA: Diagnosis not present

## 2022-11-02 DIAGNOSIS — E785 Hyperlipidemia, unspecified: Secondary | ICD-10-CM | POA: Diagnosis not present

## 2022-11-02 DIAGNOSIS — G301 Alzheimer's disease with late onset: Secondary | ICD-10-CM | POA: Diagnosis not present

## 2022-11-02 DIAGNOSIS — M1990 Unspecified osteoarthritis, unspecified site: Secondary | ICD-10-CM | POA: Diagnosis not present

## 2022-11-02 DIAGNOSIS — I10 Essential (primary) hypertension: Secondary | ICD-10-CM | POA: Diagnosis not present

## 2022-11-02 DIAGNOSIS — K219 Gastro-esophageal reflux disease without esophagitis: Secondary | ICD-10-CM | POA: Diagnosis not present

## 2022-11-02 DIAGNOSIS — F0280 Dementia in other diseases classified elsewhere without behavioral disturbance: Secondary | ICD-10-CM | POA: Diagnosis not present

## 2022-11-03 DIAGNOSIS — K219 Gastro-esophageal reflux disease without esophagitis: Secondary | ICD-10-CM | POA: Diagnosis not present

## 2022-11-03 DIAGNOSIS — M1990 Unspecified osteoarthritis, unspecified site: Secondary | ICD-10-CM | POA: Diagnosis not present

## 2022-11-03 DIAGNOSIS — G301 Alzheimer's disease with late onset: Secondary | ICD-10-CM | POA: Diagnosis not present

## 2022-11-03 DIAGNOSIS — E785 Hyperlipidemia, unspecified: Secondary | ICD-10-CM | POA: Diagnosis not present

## 2022-11-03 DIAGNOSIS — I10 Essential (primary) hypertension: Secondary | ICD-10-CM | POA: Diagnosis not present

## 2022-11-03 DIAGNOSIS — F0280 Dementia in other diseases classified elsewhere without behavioral disturbance: Secondary | ICD-10-CM | POA: Diagnosis not present

## 2022-11-04 ENCOUNTER — Ambulatory Visit: Payer: Self-pay

## 2022-11-04 DIAGNOSIS — E785 Hyperlipidemia, unspecified: Secondary | ICD-10-CM | POA: Diagnosis not present

## 2022-11-04 DIAGNOSIS — K219 Gastro-esophageal reflux disease without esophagitis: Secondary | ICD-10-CM | POA: Diagnosis not present

## 2022-11-04 DIAGNOSIS — I10 Essential (primary) hypertension: Secondary | ICD-10-CM | POA: Diagnosis not present

## 2022-11-04 DIAGNOSIS — G301 Alzheimer's disease with late onset: Secondary | ICD-10-CM | POA: Diagnosis not present

## 2022-11-04 DIAGNOSIS — F0280 Dementia in other diseases classified elsewhere without behavioral disturbance: Secondary | ICD-10-CM | POA: Diagnosis not present

## 2022-11-04 DIAGNOSIS — M1990 Unspecified osteoarthritis, unspecified site: Secondary | ICD-10-CM | POA: Diagnosis not present

## 2022-11-04 NOTE — Patient Instructions (Addendum)
Visit Information  Thank you for taking time to visit with me today. Please don't hesitate to contact me if I can be of assistance to you.   Following are the goals we discussed today:   Goals Addressed             This Visit's Progress    To receive Hospice for comfort care       Care Coordination Interventions: Completed successful outbound call with patient's POA and niece [name] Evaluation of current treatment plan related to dementia w/worsening physical and mental decline and patient's adherence to plan as established by provider Confirmed with niece [name], patient is now under the care of Hospice through [facility] Determined patient currently has a hospital bed with a gel overlay  Determined patient has an aide assigned for personal hygiene needs three times weekly  Discussed that patient is currently being treated for a UTI, she is c/o nausea, she has been less verbal overall, the family is finding it more difficult to get food and fluids in her Determined [name] feels very supported by the Hospice team and feels that her aunt is receiving the care needed to keep her comfortable as she approaches end of life Routed note to PCP provider and LCSW Jenel Lucks          Our next appointment is by telephone on 12/03/22 at 12:30 PM  Please call the care guide team at [phone] if you need to cancel or reschedule your appointment.   If you are experiencing a Mental Health or Behavioral Health Crisis or need someone to talk to, please call 1-800-273-TALK (toll free, 24 hour hotline)  Patient verbalizes understanding of instructions and care plan provided today and agrees to view in MyChart. Active MyChart status and patient understanding of how to access instructions and care plan via MyChart confirmed with patient.     Delsa Sale RN BSN CCM [place]  [facility], [facility] Nurse Care Coordinator  Direct Dial: [phone] Website:  [email or web address]

## 2022-11-04 NOTE — Patient Outreach (Signed)
  Care Coordination   Follow Up Visit Note   11/04/2022 Name: Tammy [name] MRN: [id number] DOB: [birth year]  Tammy [name] is a 89 y.o. year old female who sees Arnette Felts, FNP for primary care. I spoke with niece [name] by phone today.  What matters to the patients health and wellness today?  Patient is currently under the care of Hospice.     Goals Addressed             This Visit's Progress    To receive Hospice for comfort care       Care Coordination Interventions: Completed successful outbound call with patient's POA and niece [name] Evaluation of current treatment plan related to dementia w/worsening physical and mental decline and patient's adherence to plan as established by provider Confirmed with niece [name], patient is now under the care of Hospice through [facility] Determined patient currently has a hospital bed with a gel overlay  Determined patient has an aide assigned for personal hygiene needs three times weekly  Discussed that patient is currently being treated for a UTI, she is c/o nausea, she has been less verbal overall, the family is finding it more difficult to get food and fluids in her Determined [name] feels very supported by the Hospice team and feels that her aunt is receiving the care needed to keep her comfortable as she approaches end of life Routed note to PCP provider and LCSW Jenel Lucks      Interventions Today    Flowsheet Row Most Recent Value  Chronic Disease   Chronic disease during today's visit Other  [advanced Dementia,  frailty]  General Interventions   General Interventions Discussed/Reviewed Doctor Visits, General Interventions Reviewed, General Interventions Discussed, Communication with  Doctor Visits Discussed/Reviewed Doctor Visits Discussed, Doctor Visits Reviewed, Specialist, PCP  Communication with Social Work  Jenel Lucks LCSW]  Education Interventions   Education Provided Provided Education   Provided Verbal Education On Nutrition, Medication  Pharmacy Interventions   Pharmacy Dicussed/Reviewed Pharmacy Topics Discussed, Pharmacy Topics Reviewed, Medications and their functions  Advanced Directive Interventions   Advanced Directives Discussed/Reviewed End of Life  End of Life Hospice          SDOH assessments and interventions completed:  No     Care Coordination Interventions:  Yes, provided   Follow up plan: Follow up call scheduled for 12/03/22 @12 :30 PM    Encounter Outcome:  Patient Visit Completed

## 2022-11-05 ENCOUNTER — Other Ambulatory Visit: Payer: Self-pay | Admitting: Nurse Practitioner

## 2022-11-05 DIAGNOSIS — G301 Alzheimer's disease with late onset: Secondary | ICD-10-CM | POA: Diagnosis not present

## 2022-11-05 DIAGNOSIS — E785 Hyperlipidemia, unspecified: Secondary | ICD-10-CM | POA: Diagnosis not present

## 2022-11-05 DIAGNOSIS — I10 Essential (primary) hypertension: Secondary | ICD-10-CM | POA: Diagnosis not present

## 2022-11-05 DIAGNOSIS — F0280 Dementia in other diseases classified elsewhere without behavioral disturbance: Secondary | ICD-10-CM | POA: Diagnosis not present

## 2022-11-05 DIAGNOSIS — K219 Gastro-esophageal reflux disease without esophagitis: Secondary | ICD-10-CM | POA: Diagnosis not present

## 2022-11-05 DIAGNOSIS — M1990 Unspecified osteoarthritis, unspecified site: Secondary | ICD-10-CM | POA: Diagnosis not present

## 2022-11-06 DIAGNOSIS — F0280 Dementia in other diseases classified elsewhere without behavioral disturbance: Secondary | ICD-10-CM | POA: Diagnosis not present

## 2022-11-06 DIAGNOSIS — I10 Essential (primary) hypertension: Secondary | ICD-10-CM | POA: Diagnosis not present

## 2022-11-06 DIAGNOSIS — K219 Gastro-esophageal reflux disease without esophagitis: Secondary | ICD-10-CM | POA: Diagnosis not present

## 2022-11-06 DIAGNOSIS — M1990 Unspecified osteoarthritis, unspecified site: Secondary | ICD-10-CM | POA: Diagnosis not present

## 2022-11-06 DIAGNOSIS — G301 Alzheimer's disease with late onset: Secondary | ICD-10-CM | POA: Diagnosis not present

## 2022-11-06 DIAGNOSIS — E785 Hyperlipidemia, unspecified: Secondary | ICD-10-CM | POA: Diagnosis not present

## 2022-11-07 DIAGNOSIS — M1990 Unspecified osteoarthritis, unspecified site: Secondary | ICD-10-CM | POA: Diagnosis not present

## 2022-11-07 DIAGNOSIS — G301 Alzheimer's disease with late onset: Secondary | ICD-10-CM | POA: Diagnosis not present

## 2022-11-07 DIAGNOSIS — E785 Hyperlipidemia, unspecified: Secondary | ICD-10-CM | POA: Diagnosis not present

## 2022-11-07 DIAGNOSIS — I10 Essential (primary) hypertension: Secondary | ICD-10-CM | POA: Diagnosis not present

## 2022-11-07 DIAGNOSIS — F0280 Dementia in other diseases classified elsewhere without behavioral disturbance: Secondary | ICD-10-CM | POA: Diagnosis not present

## 2022-11-07 DIAGNOSIS — K219 Gastro-esophageal reflux disease without esophagitis: Secondary | ICD-10-CM | POA: Diagnosis not present

## 2022-11-08 ENCOUNTER — Ambulatory Visit: Payer: Self-pay | Admitting: Licensed Clinical Social Worker

## 2022-11-08 DIAGNOSIS — G301 Alzheimer's disease with late onset: Secondary | ICD-10-CM | POA: Diagnosis not present

## 2022-11-08 DIAGNOSIS — F0280 Dementia in other diseases classified elsewhere without behavioral disturbance: Secondary | ICD-10-CM | POA: Diagnosis not present

## 2022-11-08 DIAGNOSIS — K219 Gastro-esophageal reflux disease without esophagitis: Secondary | ICD-10-CM | POA: Diagnosis not present

## 2022-11-08 DIAGNOSIS — I10 Essential (primary) hypertension: Secondary | ICD-10-CM | POA: Diagnosis not present

## 2022-11-08 DIAGNOSIS — E785 Hyperlipidemia, unspecified: Secondary | ICD-10-CM | POA: Diagnosis not present

## 2022-11-08 DIAGNOSIS — M1990 Unspecified osteoarthritis, unspecified site: Secondary | ICD-10-CM | POA: Diagnosis not present

## 2022-11-09 DIAGNOSIS — F0280 Dementia in other diseases classified elsewhere without behavioral disturbance: Secondary | ICD-10-CM | POA: Diagnosis not present

## 2022-11-09 DIAGNOSIS — M1990 Unspecified osteoarthritis, unspecified site: Secondary | ICD-10-CM | POA: Diagnosis not present

## 2022-11-09 DIAGNOSIS — E785 Hyperlipidemia, unspecified: Secondary | ICD-10-CM | POA: Diagnosis not present

## 2022-11-09 DIAGNOSIS — I10 Essential (primary) hypertension: Secondary | ICD-10-CM | POA: Diagnosis not present

## 2022-11-09 DIAGNOSIS — K219 Gastro-esophageal reflux disease without esophagitis: Secondary | ICD-10-CM | POA: Diagnosis not present

## 2022-11-09 DIAGNOSIS — G301 Alzheimer's disease with late onset: Secondary | ICD-10-CM | POA: Diagnosis not present

## 2022-11-09 NOTE — Patient Instructions (Signed)
Visit Information  Thank you for taking time to visit with me today. Please don't hesitate to contact me if I can be of assistance to you.   Following are the goals we discussed today:   Goals Addressed             This Visit's Progress    Obtain Support Resources-Caregiver Strain   On track    Activities and task to complete in order to accomplish goals.   Keep all upcoming appointments discussed today Continue with compliance of taking medication prescribed by Doctor Implement healthy coping skills discussed to assist with management of symptoms Continue working with [facility] care team to assist with goals identified          Our next appointment is by telephone on 12/16 at 9:30 AM  Please call the care guide team at [phone] if you need to cancel or reschedule your appointment.   If you are experiencing a Mental Health or Behavioral Health Crisis or need someone to talk to, please call the Suicide and Crisis Lifeline: 988 call 911   Patient verbalizes understanding of instructions and care plan provided today and agrees to view in MyChart. Active MyChart status and patient understanding of how to access instructions and care plan via MyChart confirmed with patient.     Jenel Lucks, MSW, LCSW [facility] Care Management [place]  [facility] [email or web address] Phone [phone] 1:20 PM

## 2022-11-09 NOTE — Patient Outreach (Signed)
  Care Coordination   Follow Up Visit Note   11/08/2022 Name: Tammy [name] MRN: [id number] DOB: 1934/01/16  Tammy [name] is a 89 y.o. year old female who sees Tammy Felts, FNP for primary care. I spoke with  Tammy [name] by phone today.  What matters to the patients health and wellness today?  Caregiver Strain    Goals Addressed             This Visit's Progress    Obtain Support Resources-Caregiver Strain   On track    Activities and task to complete in order to accomplish goals.   Keep all upcoming appointments discussed today Continue with compliance of taking medication prescribed by Doctor Implement healthy coping skills discussed to assist with management of symptoms Continue working with [facility] care team to assist with goals identified          SDOH assessments and interventions completed:  No     Care Coordination Interventions:  Yes, provided  Interventions Today    Flowsheet Row Most Recent Value  Chronic Disease   Chronic disease during today's visit Other  [Advanced Alzheimer's]  General Interventions   General Interventions Discussed/Reviewed General Interventions Reviewed, Level of Care, Doctor Visits  [Pt's niece reports strong support from hospice and is satisfied with services provided. Communication is going well with staff]  Doctor Visits Discussed/Reviewed Doctor Visits Reviewed  Level of Care Personal Care Services  [Nurse and aid visits "pretty often" Bath aid visits twice weekly]  Mental Health Interventions   Mental Health Discussed/Reviewed Mental Health Reviewed, Coping Strategies, Anxiety, Grief and Loss  [Strategies regarding symptoms associated with caregiver stress discussed. Niece is scheduled to visit family 10/17-11-02]  Nutrition Interventions   Nutrition Discussed/Reviewed Nutrition Reviewed, Supplemental nutrition  Pharmacy Interventions   Pharmacy Dicussed/Reviewed Pharmacy Topics Reviewed, Medication Adherence  Safety  Interventions   Safety Discussed/Reviewed Safety Reviewed  Advanced Directive Interventions   Advanced Directives Discussed/Reviewed Advanced Directives Reviewed  [Advanced Directives revised to DNR]       Follow up plan: Follow up call scheduled for 4-6 weeks    Encounter Outcome:  Patient Visit Completed   Jenel Lucks, MSW, LCSW [facility] Care Management [facility] Health  [facility] [email or web address] Phone [phone] 1:19 PM

## 2022-11-10 ENCOUNTER — Other Ambulatory Visit: Payer: Self-pay

## 2022-11-10 DIAGNOSIS — F0280 Dementia in other diseases classified elsewhere without behavioral disturbance: Secondary | ICD-10-CM | POA: Diagnosis not present

## 2022-11-10 DIAGNOSIS — E785 Hyperlipidemia, unspecified: Secondary | ICD-10-CM | POA: Diagnosis not present

## 2022-11-10 DIAGNOSIS — M1990 Unspecified osteoarthritis, unspecified site: Secondary | ICD-10-CM | POA: Diagnosis not present

## 2022-11-10 DIAGNOSIS — I10 Essential (primary) hypertension: Secondary | ICD-10-CM | POA: Diagnosis not present

## 2022-11-10 DIAGNOSIS — K219 Gastro-esophageal reflux disease without esophagitis: Secondary | ICD-10-CM | POA: Diagnosis not present

## 2022-11-10 DIAGNOSIS — G301 Alzheimer's disease with late onset: Secondary | ICD-10-CM | POA: Diagnosis not present

## 2022-11-10 MED ORDER — DONEPEZIL HCL 10 MG PO TABS: 10.0000 mg | ORAL_TABLET | Freq: Every day | ORAL | 2 refills | Status: DC

## 2022-11-11 DIAGNOSIS — G301 Alzheimer's disease with late onset: Secondary | ICD-10-CM | POA: Diagnosis not present

## 2022-11-11 DIAGNOSIS — I10 Essential (primary) hypertension: Secondary | ICD-10-CM | POA: Diagnosis not present

## 2022-11-11 DIAGNOSIS — F0280 Dementia in other diseases classified elsewhere without behavioral disturbance: Secondary | ICD-10-CM | POA: Diagnosis not present

## 2022-11-11 DIAGNOSIS — K219 Gastro-esophageal reflux disease without esophagitis: Secondary | ICD-10-CM | POA: Diagnosis not present

## 2022-11-11 DIAGNOSIS — M1990 Unspecified osteoarthritis, unspecified site: Secondary | ICD-10-CM | POA: Diagnosis not present

## 2022-11-11 DIAGNOSIS — E785 Hyperlipidemia, unspecified: Secondary | ICD-10-CM | POA: Diagnosis not present

## 2022-11-12 DIAGNOSIS — K219 Gastro-esophageal reflux disease without esophagitis: Secondary | ICD-10-CM | POA: Diagnosis not present

## 2022-11-12 DIAGNOSIS — G301 Alzheimer's disease with late onset: Secondary | ICD-10-CM | POA: Diagnosis not present

## 2022-11-12 DIAGNOSIS — E785 Hyperlipidemia, unspecified: Secondary | ICD-10-CM | POA: Diagnosis not present

## 2022-11-12 DIAGNOSIS — F0280 Dementia in other diseases classified elsewhere without behavioral disturbance: Secondary | ICD-10-CM | POA: Diagnosis not present

## 2022-11-12 DIAGNOSIS — M1990 Unspecified osteoarthritis, unspecified site: Secondary | ICD-10-CM | POA: Diagnosis not present

## 2022-11-12 DIAGNOSIS — I10 Essential (primary) hypertension: Secondary | ICD-10-CM | POA: Diagnosis not present

## 2022-11-13 DIAGNOSIS — F0280 Dementia in other diseases classified elsewhere without behavioral disturbance: Secondary | ICD-10-CM | POA: Diagnosis not present

## 2022-11-13 DIAGNOSIS — M1990 Unspecified osteoarthritis, unspecified site: Secondary | ICD-10-CM | POA: Diagnosis not present

## 2022-11-13 DIAGNOSIS — G301 Alzheimer's disease with late onset: Secondary | ICD-10-CM | POA: Diagnosis not present

## 2022-11-13 DIAGNOSIS — K219 Gastro-esophageal reflux disease without esophagitis: Secondary | ICD-10-CM | POA: Diagnosis not present

## 2022-11-13 DIAGNOSIS — I10 Essential (primary) hypertension: Secondary | ICD-10-CM | POA: Diagnosis not present

## 2022-11-13 DIAGNOSIS — E785 Hyperlipidemia, unspecified: Secondary | ICD-10-CM | POA: Diagnosis not present

## 2022-11-14 DIAGNOSIS — M1990 Unspecified osteoarthritis, unspecified site: Secondary | ICD-10-CM | POA: Diagnosis not present

## 2022-11-14 DIAGNOSIS — E785 Hyperlipidemia, unspecified: Secondary | ICD-10-CM | POA: Diagnosis not present

## 2022-11-14 DIAGNOSIS — F0280 Dementia in other diseases classified elsewhere without behavioral disturbance: Secondary | ICD-10-CM | POA: Diagnosis not present

## 2022-11-14 DIAGNOSIS — G301 Alzheimer's disease with late onset: Secondary | ICD-10-CM | POA: Diagnosis not present

## 2022-11-14 DIAGNOSIS — I10 Essential (primary) hypertension: Secondary | ICD-10-CM | POA: Diagnosis not present

## 2022-11-14 DIAGNOSIS — K219 Gastro-esophageal reflux disease without esophagitis: Secondary | ICD-10-CM | POA: Diagnosis not present

## 2022-11-15 DIAGNOSIS — F0280 Dementia in other diseases classified elsewhere without behavioral disturbance: Secondary | ICD-10-CM | POA: Diagnosis not present

## 2022-11-15 DIAGNOSIS — M1990 Unspecified osteoarthritis, unspecified site: Secondary | ICD-10-CM | POA: Diagnosis not present

## 2022-11-15 DIAGNOSIS — G301 Alzheimer's disease with late onset: Secondary | ICD-10-CM | POA: Diagnosis not present

## 2022-11-15 DIAGNOSIS — I10 Essential (primary) hypertension: Secondary | ICD-10-CM | POA: Diagnosis not present

## 2022-11-15 DIAGNOSIS — E785 Hyperlipidemia, unspecified: Secondary | ICD-10-CM | POA: Diagnosis not present

## 2022-11-15 DIAGNOSIS — K219 Gastro-esophageal reflux disease without esophagitis: Secondary | ICD-10-CM | POA: Diagnosis not present

## 2022-11-16 DIAGNOSIS — E785 Hyperlipidemia, unspecified: Secondary | ICD-10-CM | POA: Diagnosis not present

## 2022-11-16 DIAGNOSIS — M1990 Unspecified osteoarthritis, unspecified site: Secondary | ICD-10-CM | POA: Diagnosis not present

## 2022-11-16 DIAGNOSIS — G301 Alzheimer's disease with late onset: Secondary | ICD-10-CM | POA: Diagnosis not present

## 2022-11-16 DIAGNOSIS — I10 Essential (primary) hypertension: Secondary | ICD-10-CM | POA: Diagnosis not present

## 2022-11-16 DIAGNOSIS — K219 Gastro-esophageal reflux disease without esophagitis: Secondary | ICD-10-CM | POA: Diagnosis not present

## 2022-11-16 DIAGNOSIS — F0280 Dementia in other diseases classified elsewhere without behavioral disturbance: Secondary | ICD-10-CM | POA: Diagnosis not present

## 2022-11-17 DIAGNOSIS — F0280 Dementia in other diseases classified elsewhere without behavioral disturbance: Secondary | ICD-10-CM | POA: Diagnosis not present

## 2022-11-17 DIAGNOSIS — G301 Alzheimer's disease with late onset: Secondary | ICD-10-CM | POA: Diagnosis not present

## 2022-11-17 DIAGNOSIS — K219 Gastro-esophageal reflux disease without esophagitis: Secondary | ICD-10-CM | POA: Diagnosis not present

## 2022-11-17 DIAGNOSIS — I10 Essential (primary) hypertension: Secondary | ICD-10-CM | POA: Diagnosis not present

## 2022-11-17 DIAGNOSIS — E785 Hyperlipidemia, unspecified: Secondary | ICD-10-CM | POA: Diagnosis not present

## 2022-11-17 DIAGNOSIS — M1990 Unspecified osteoarthritis, unspecified site: Secondary | ICD-10-CM | POA: Diagnosis not present

## 2022-11-18 DIAGNOSIS — F0280 Dementia in other diseases classified elsewhere without behavioral disturbance: Secondary | ICD-10-CM | POA: Diagnosis not present

## 2022-11-18 DIAGNOSIS — G301 Alzheimer's disease with late onset: Secondary | ICD-10-CM | POA: Diagnosis not present

## 2022-11-18 DIAGNOSIS — I10 Essential (primary) hypertension: Secondary | ICD-10-CM | POA: Diagnosis not present

## 2022-11-18 DIAGNOSIS — E785 Hyperlipidemia, unspecified: Secondary | ICD-10-CM | POA: Diagnosis not present

## 2022-11-18 DIAGNOSIS — M1990 Unspecified osteoarthritis, unspecified site: Secondary | ICD-10-CM | POA: Diagnosis not present

## 2022-11-18 DIAGNOSIS — K219 Gastro-esophageal reflux disease without esophagitis: Secondary | ICD-10-CM | POA: Diagnosis not present

## 2022-11-19 DIAGNOSIS — E785 Hyperlipidemia, unspecified: Secondary | ICD-10-CM | POA: Diagnosis not present

## 2022-11-19 DIAGNOSIS — M1990 Unspecified osteoarthritis, unspecified site: Secondary | ICD-10-CM | POA: Diagnosis not present

## 2022-11-19 DIAGNOSIS — G301 Alzheimer's disease with late onset: Secondary | ICD-10-CM | POA: Diagnosis not present

## 2022-11-19 DIAGNOSIS — K219 Gastro-esophageal reflux disease without esophagitis: Secondary | ICD-10-CM | POA: Diagnosis not present

## 2022-11-19 DIAGNOSIS — F0280 Dementia in other diseases classified elsewhere without behavioral disturbance: Secondary | ICD-10-CM | POA: Diagnosis not present

## 2022-11-19 DIAGNOSIS — I10 Essential (primary) hypertension: Secondary | ICD-10-CM | POA: Diagnosis not present

## 2022-11-20 DIAGNOSIS — K219 Gastro-esophageal reflux disease without esophagitis: Secondary | ICD-10-CM | POA: Diagnosis not present

## 2022-11-20 DIAGNOSIS — I10 Essential (primary) hypertension: Secondary | ICD-10-CM | POA: Diagnosis not present

## 2022-11-20 DIAGNOSIS — G301 Alzheimer's disease with late onset: Secondary | ICD-10-CM | POA: Diagnosis not present

## 2022-11-20 DIAGNOSIS — M1990 Unspecified osteoarthritis, unspecified site: Secondary | ICD-10-CM | POA: Diagnosis not present

## 2022-11-20 DIAGNOSIS — F0280 Dementia in other diseases classified elsewhere without behavioral disturbance: Secondary | ICD-10-CM | POA: Diagnosis not present

## 2022-11-20 DIAGNOSIS — E785 Hyperlipidemia, unspecified: Secondary | ICD-10-CM | POA: Diagnosis not present

## 2022-11-21 DIAGNOSIS — E785 Hyperlipidemia, unspecified: Secondary | ICD-10-CM | POA: Diagnosis not present

## 2022-11-21 DIAGNOSIS — M1990 Unspecified osteoarthritis, unspecified site: Secondary | ICD-10-CM | POA: Diagnosis not present

## 2022-11-21 DIAGNOSIS — I10 Essential (primary) hypertension: Secondary | ICD-10-CM | POA: Diagnosis not present

## 2022-11-21 DIAGNOSIS — K219 Gastro-esophageal reflux disease without esophagitis: Secondary | ICD-10-CM | POA: Diagnosis not present

## 2022-11-21 DIAGNOSIS — F0280 Dementia in other diseases classified elsewhere without behavioral disturbance: Secondary | ICD-10-CM | POA: Diagnosis not present

## 2022-11-21 DIAGNOSIS — G301 Alzheimer's disease with late onset: Secondary | ICD-10-CM | POA: Diagnosis not present

## 2022-11-22 DIAGNOSIS — F0280 Dementia in other diseases classified elsewhere without behavioral disturbance: Secondary | ICD-10-CM | POA: Diagnosis not present

## 2022-11-22 DIAGNOSIS — M1990 Unspecified osteoarthritis, unspecified site: Secondary | ICD-10-CM | POA: Diagnosis not present

## 2022-11-22 DIAGNOSIS — G301 Alzheimer's disease with late onset: Secondary | ICD-10-CM | POA: Diagnosis not present

## 2022-11-22 DIAGNOSIS — I10 Essential (primary) hypertension: Secondary | ICD-10-CM | POA: Diagnosis not present

## 2022-11-22 DIAGNOSIS — E785 Hyperlipidemia, unspecified: Secondary | ICD-10-CM | POA: Diagnosis not present

## 2022-11-22 DIAGNOSIS — K219 Gastro-esophageal reflux disease without esophagitis: Secondary | ICD-10-CM | POA: Diagnosis not present

## 2022-11-23 DIAGNOSIS — E785 Hyperlipidemia, unspecified: Secondary | ICD-10-CM | POA: Diagnosis not present

## 2022-11-23 DIAGNOSIS — I10 Essential (primary) hypertension: Secondary | ICD-10-CM | POA: Diagnosis not present

## 2022-11-23 DIAGNOSIS — M1990 Unspecified osteoarthritis, unspecified site: Secondary | ICD-10-CM | POA: Diagnosis not present

## 2022-11-23 DIAGNOSIS — G301 Alzheimer's disease with late onset: Secondary | ICD-10-CM | POA: Diagnosis not present

## 2022-11-23 DIAGNOSIS — F0280 Dementia in other diseases classified elsewhere without behavioral disturbance: Secondary | ICD-10-CM | POA: Diagnosis not present

## 2022-11-23 DIAGNOSIS — K219 Gastro-esophageal reflux disease without esophagitis: Secondary | ICD-10-CM | POA: Diagnosis not present

## 2022-11-24 DIAGNOSIS — G301 Alzheimer's disease with late onset: Secondary | ICD-10-CM | POA: Diagnosis not present

## 2022-11-24 DIAGNOSIS — K219 Gastro-esophageal reflux disease without esophagitis: Secondary | ICD-10-CM | POA: Diagnosis not present

## 2022-11-24 DIAGNOSIS — F0280 Dementia in other diseases classified elsewhere without behavioral disturbance: Secondary | ICD-10-CM | POA: Diagnosis not present

## 2022-11-24 DIAGNOSIS — E785 Hyperlipidemia, unspecified: Secondary | ICD-10-CM | POA: Diagnosis not present

## 2022-11-24 DIAGNOSIS — M1990 Unspecified osteoarthritis, unspecified site: Secondary | ICD-10-CM | POA: Diagnosis not present

## 2022-11-24 DIAGNOSIS — I10 Essential (primary) hypertension: Secondary | ICD-10-CM | POA: Diagnosis not present

## 2022-11-25 DIAGNOSIS — E785 Hyperlipidemia, unspecified: Secondary | ICD-10-CM | POA: Diagnosis not present

## 2022-11-25 DIAGNOSIS — F0280 Dementia in other diseases classified elsewhere without behavioral disturbance: Secondary | ICD-10-CM | POA: Diagnosis not present

## 2022-11-25 DIAGNOSIS — K219 Gastro-esophageal reflux disease without esophagitis: Secondary | ICD-10-CM | POA: Diagnosis not present

## 2022-11-25 DIAGNOSIS — I10 Essential (primary) hypertension: Secondary | ICD-10-CM | POA: Diagnosis not present

## 2022-11-25 DIAGNOSIS — G301 Alzheimer's disease with late onset: Secondary | ICD-10-CM | POA: Diagnosis not present

## 2022-11-25 DIAGNOSIS — M1990 Unspecified osteoarthritis, unspecified site: Secondary | ICD-10-CM | POA: Diagnosis not present

## 2022-11-26 DIAGNOSIS — I10 Essential (primary) hypertension: Secondary | ICD-10-CM | POA: Diagnosis not present

## 2022-11-26 DIAGNOSIS — M1990 Unspecified osteoarthritis, unspecified site: Secondary | ICD-10-CM | POA: Diagnosis not present

## 2022-11-26 DIAGNOSIS — F0280 Dementia in other diseases classified elsewhere without behavioral disturbance: Secondary | ICD-10-CM | POA: Diagnosis not present

## 2022-11-26 DIAGNOSIS — H409 Unspecified glaucoma: Secondary | ICD-10-CM | POA: Diagnosis not present

## 2022-11-26 DIAGNOSIS — K219 Gastro-esophageal reflux disease without esophagitis: Secondary | ICD-10-CM | POA: Diagnosis not present

## 2022-11-26 DIAGNOSIS — D649 Anemia, unspecified: Secondary | ICD-10-CM | POA: Diagnosis not present

## 2022-11-26 DIAGNOSIS — E785 Hyperlipidemia, unspecified: Secondary | ICD-10-CM | POA: Diagnosis not present

## 2022-11-26 DIAGNOSIS — K641 Second degree hemorrhoids: Secondary | ICD-10-CM | POA: Diagnosis not present

## 2022-11-26 DIAGNOSIS — G301 Alzheimer's disease with late onset: Secondary | ICD-10-CM | POA: Diagnosis not present

## 2022-11-29 DIAGNOSIS — I10 Essential (primary) hypertension: Secondary | ICD-10-CM | POA: Diagnosis not present

## 2022-11-29 DIAGNOSIS — K219 Gastro-esophageal reflux disease without esophagitis: Secondary | ICD-10-CM | POA: Diagnosis not present

## 2022-11-29 DIAGNOSIS — F0280 Dementia in other diseases classified elsewhere without behavioral disturbance: Secondary | ICD-10-CM | POA: Diagnosis not present

## 2022-11-29 DIAGNOSIS — M1990 Unspecified osteoarthritis, unspecified site: Secondary | ICD-10-CM | POA: Diagnosis not present

## 2022-11-29 DIAGNOSIS — G301 Alzheimer's disease with late onset: Secondary | ICD-10-CM | POA: Diagnosis not present

## 2022-11-29 DIAGNOSIS — E785 Hyperlipidemia, unspecified: Secondary | ICD-10-CM | POA: Diagnosis not present

## 2022-12-01 DIAGNOSIS — M1990 Unspecified osteoarthritis, unspecified site: Secondary | ICD-10-CM | POA: Diagnosis not present

## 2022-12-01 DIAGNOSIS — K219 Gastro-esophageal reflux disease without esophagitis: Secondary | ICD-10-CM | POA: Diagnosis not present

## 2022-12-01 DIAGNOSIS — I10 Essential (primary) hypertension: Secondary | ICD-10-CM | POA: Diagnosis not present

## 2022-12-01 DIAGNOSIS — E785 Hyperlipidemia, unspecified: Secondary | ICD-10-CM | POA: Diagnosis not present

## 2022-12-01 DIAGNOSIS — G301 Alzheimer's disease with late onset: Secondary | ICD-10-CM | POA: Diagnosis not present

## 2022-12-01 DIAGNOSIS — F0280 Dementia in other diseases classified elsewhere without behavioral disturbance: Secondary | ICD-10-CM | POA: Diagnosis not present

## 2022-12-03 ENCOUNTER — Ambulatory Visit: Payer: Self-pay

## 2022-12-03 DIAGNOSIS — I10 Essential (primary) hypertension: Secondary | ICD-10-CM | POA: Diagnosis not present

## 2022-12-03 DIAGNOSIS — M1990 Unspecified osteoarthritis, unspecified site: Secondary | ICD-10-CM | POA: Diagnosis not present

## 2022-12-03 DIAGNOSIS — G301 Alzheimer's disease with late onset: Secondary | ICD-10-CM | POA: Diagnosis not present

## 2022-12-03 DIAGNOSIS — E785 Hyperlipidemia, unspecified: Secondary | ICD-10-CM | POA: Diagnosis not present

## 2022-12-03 DIAGNOSIS — F0280 Dementia in other diseases classified elsewhere without behavioral disturbance: Secondary | ICD-10-CM | POA: Diagnosis not present

## 2022-12-03 DIAGNOSIS — K219 Gastro-esophageal reflux disease without esophagitis: Secondary | ICD-10-CM | POA: Diagnosis not present

## 2022-12-03 NOTE — Patient Instructions (Signed)
Visit Information  Thank you for taking time to visit with me today. Please don't hesitate to contact me if I can be of assistance to you.   Following are the goals we discussed today:   Goals Addressed             This Visit's Progress    To receive Hospice for comfort care   On track    Care Coordination Interventions: Completed successful outbound call with patient's POA and niece [name] Evaluation of current treatment plan related to dementia w/worsening physical and mental decline and patient's adherence to plan as established by provider Discussed patient continues to decline, her oral intake has greatly decreased resulting in decreased urinary output  Discussed [name] will meet with the Hospice nurse to determined if patient will transition to an inpatient facility  Discussed plans with patient for ongoing care coordination follow up and provided patient with direct contact information for nurse care coordinator        Our next appointment is by telephone on 12/13/22 @10 :00 AM   Please call the care guide team at [phone] if you need to cancel or reschedule your appointment.   If you are experiencing a Mental Health or Behavioral Health Crisis or need someone to talk to, please call 1-800-273-TALK (toll free, 24 hour hotline)  Patient verbalizes understanding of instructions and care plan provided today and agrees to view in MyChart. Active MyChart status and patient understanding of how to access instructions and care plan via MyChart confirmed with patient.     Delsa Sale RN BSN CCM [place]  [facility], [facility] Health Nurse Care Coordinator  Direct Dial: [phone] Website: [email or web address]

## 2022-12-03 NOTE — Patient Outreach (Signed)
  Care Coordination   Follow Up Visit Note   12/03/2022 Name: Tammy [name] MRN: [id number] DOB: 09/23/1933  Tammy [name] is a 89 y.o. year old female who sees Arnette Felts, FNP for primary care. I spoke with niece Tammy [name] by phone today.  What matters to the patients health and wellness today?  Patient continues to be under the care of Hospice. She may transfer to an inpatient Hospice facility.     Goals Addressed             This Visit's Progress    To receive Hospice for comfort care   On track    Care Coordination Interventions: Completed successful outbound call with patient's POA and niece Tammy [name] Evaluation of current treatment plan related to dementia w/worsening physical and mental decline and patient's adherence to plan as established by provider Discussed patient continues to decline, her oral intake has greatly decreased resulting in decreased urinary output  Discussed Tammy [name] will meet with the Hospice nurse to determined if patient will transition to an inpatient facility  Discussed plans with patient for ongoing care coordination follow up and provided patient with direct contact information for nurse care coordinator    Interventions Today    Flowsheet Row Most Recent Value  Chronic Disease   Chronic disease during today's visit Other  [dementia]  General Interventions   General Interventions Discussed/Reviewed General Interventions Discussed, General Interventions Reviewed, Doctor Visits, Level of Care  Nutrition Interventions   Nutrition Discussed/Reviewed Nutrition Discussed, Nutrition Reviewed  Advanced Directive Interventions   Advanced Directives Discussed/Reviewed End of Life  End of Life Hospice          SDOH assessments and interventions completed:  No     Care Coordination Interventions:  Yes, provided   Follow up plan: Follow up call scheduled for 12/13/22 @10 :00 AM    Encounter Outcome:  Patient Visit Completed

## 2022-12-06 DIAGNOSIS — G301 Alzheimer's disease with late onset: Secondary | ICD-10-CM | POA: Diagnosis not present

## 2022-12-06 DIAGNOSIS — M1990 Unspecified osteoarthritis, unspecified site: Secondary | ICD-10-CM | POA: Diagnosis not present

## 2022-12-06 DIAGNOSIS — E785 Hyperlipidemia, unspecified: Secondary | ICD-10-CM | POA: Diagnosis not present

## 2022-12-06 DIAGNOSIS — I10 Essential (primary) hypertension: Secondary | ICD-10-CM | POA: Diagnosis not present

## 2022-12-06 DIAGNOSIS — F0280 Dementia in other diseases classified elsewhere without behavioral disturbance: Secondary | ICD-10-CM | POA: Diagnosis not present

## 2022-12-06 DIAGNOSIS — K219 Gastro-esophageal reflux disease without esophagitis: Secondary | ICD-10-CM | POA: Diagnosis not present

## 2022-12-08 DIAGNOSIS — M1990 Unspecified osteoarthritis, unspecified site: Secondary | ICD-10-CM | POA: Diagnosis not present

## 2022-12-08 DIAGNOSIS — E785 Hyperlipidemia, unspecified: Secondary | ICD-10-CM | POA: Diagnosis not present

## 2022-12-08 DIAGNOSIS — I10 Essential (primary) hypertension: Secondary | ICD-10-CM | POA: Diagnosis not present

## 2022-12-08 DIAGNOSIS — G301 Alzheimer's disease with late onset: Secondary | ICD-10-CM | POA: Diagnosis not present

## 2022-12-08 DIAGNOSIS — F0280 Dementia in other diseases classified elsewhere without behavioral disturbance: Secondary | ICD-10-CM | POA: Diagnosis not present

## 2022-12-08 DIAGNOSIS — K219 Gastro-esophageal reflux disease without esophagitis: Secondary | ICD-10-CM | POA: Diagnosis not present

## 2022-12-10 DIAGNOSIS — K219 Gastro-esophageal reflux disease without esophagitis: Secondary | ICD-10-CM | POA: Diagnosis not present

## 2022-12-10 DIAGNOSIS — F0280 Dementia in other diseases classified elsewhere without behavioral disturbance: Secondary | ICD-10-CM | POA: Diagnosis not present

## 2022-12-10 DIAGNOSIS — G301 Alzheimer's disease with late onset: Secondary | ICD-10-CM | POA: Diagnosis not present

## 2022-12-10 DIAGNOSIS — I10 Essential (primary) hypertension: Secondary | ICD-10-CM | POA: Diagnosis not present

## 2022-12-10 DIAGNOSIS — E785 Hyperlipidemia, unspecified: Secondary | ICD-10-CM | POA: Diagnosis not present

## 2022-12-10 DIAGNOSIS — M1990 Unspecified osteoarthritis, unspecified site: Secondary | ICD-10-CM | POA: Diagnosis not present

## 2022-12-13 ENCOUNTER — Ambulatory Visit: Payer: Self-pay

## 2022-12-13 NOTE — Patient Instructions (Signed)
Visit Information  Thank you for taking time to visit with me today. Please don't hesitate to contact me if I can be of assistance to you.   Following are the goals we discussed today:   Goals Addressed               This Visit's Progress     Patient Stated     COMPLETED: To lower Cholesterol levels (pt-stated)        Care Coordination Interventions: Patient currently under the care of Hospice       Other     To receive Hospice for comfort care   On track     Care Coordination Interventions: Completed successful outbound call with patient's POA and niece [name] Evaluation of current treatment plan related to dementia w/worsening physical and mental decline and patient's adherence to plan as established by provider Determined niece [name] has yet to meet with the Hospice nurse to discuss transitioning patient to an inpatient Hospice Care facility Educated patient on the difference between inpatient Hospice care verses regular long term skilled nursing care  Discussed plans with patient for ongoing care coordination follow up and provided patient with direct contact information for nurse care coordinator      COMPLETED: To reduce fall risk        Care Coordination Interventions: Patient is currently under the care of Hospice         Our next appointment is by telephone on 02/11/23 at 09:30 AM  Please call the care guide team at [phone] if you need to cancel or reschedule your appointment.   If you are experiencing a Mental Health or Behavioral Health Crisis or need someone to talk to, please call 1-800-273-TALK (toll free, 24 hour hotline)  Patient verbalizes understanding of instructions and care plan provided today and agrees to view in MyChart. Active MyChart status and patient understanding of how to access instructions and care plan via MyChart confirmed with patient.     Delsa Sale RN BSN CCM [place]  [facility], [facility]  Health Nurse Care Coordinator  Direct Dial: [phone] Website: [email or web address]

## 2022-12-13 NOTE — Patient Outreach (Signed)
  Care Coordination   Follow Up Visit Note   12/13/2022 Name: Tammy [name] MRN: [id number] DOB: July 26, 1933  Tammy [name] is a 89 y.o. year old female who sees Arnette Felts, FNP for primary care. I spoke with niece [name] by phone today.  What matters to the patients health and wellness today?  Niece [name] would like to transition patient to an inpatient Hospice facility.     Goals Addressed               This Visit's Progress     Patient Stated     COMPLETED: To lower Cholesterol levels (pt-stated)        Care Coordination Interventions: Patient currently under the care of Hospice       Other     To receive Hospice for comfort care   On track     Care Coordination Interventions: Completed successful outbound call with patient's POA and niece [name] Evaluation of current treatment plan related to dementia w/worsening physical and mental decline and patient's adherence to plan as established by provider Determined niece [name] has yet to meet with the Hospice nurse to discuss transitioning patient to an inpatient Hospice Care facility Educated patient on the difference between inpatient Hospice care verses regular long term skilled nursing care  Discussed plans with patient for ongoing care coordination follow up and provided patient with direct contact information for nurse care coordinator      COMPLETED: To reduce fall risk        Care Coordination Interventions: Patient is currently under the care of Hospice     Interventions Today    Flowsheet Row Most Recent Value  Chronic Disease   Chronic disease during today's visit Other  [dementia]  General Interventions   General Interventions Discussed/Reviewed General Interventions Discussed, General Interventions Reviewed  Advanced Directive Interventions   Advanced Directives Discussed/Reviewed End of Life  End of Life Hospice          SDOH assessments and interventions completed:  No      Care Coordination Interventions:  Yes, provided   Follow up plan: Follow up call scheduled for 02/11/23 @09 :30 AM    Encounter Outcome:  Patient Visit Completed

## 2022-12-13 NOTE — Patient Outreach (Signed)
  Care Coordination   12/13/2022 Name: Tammy [name] MRN: [id number] DOB: 1933-10-10   Care Coordination Outreach Attempts:  An unsuccessful telephone outreach was attempted for a scheduled appointment today.  Follow Up Plan:  Additional outreach attempts will be made to offer the patient care coordination information and services.   Encounter Outcome:  No Answer   Care Coordination Interventions:  No, not indicated    Delsa Sale RN BSN CCM [place]  [facility], [facility] Health Nurse Care Coordinator  Direct Dial: [phone] Website: [email or web address]

## 2022-12-15 DIAGNOSIS — E785 Hyperlipidemia, unspecified: Secondary | ICD-10-CM | POA: Diagnosis not present

## 2022-12-15 DIAGNOSIS — I10 Essential (primary) hypertension: Secondary | ICD-10-CM | POA: Diagnosis not present

## 2022-12-15 DIAGNOSIS — K219 Gastro-esophageal reflux disease without esophagitis: Secondary | ICD-10-CM | POA: Diagnosis not present

## 2022-12-15 DIAGNOSIS — G301 Alzheimer's disease with late onset: Secondary | ICD-10-CM | POA: Diagnosis not present

## 2022-12-15 DIAGNOSIS — F0280 Dementia in other diseases classified elsewhere without behavioral disturbance: Secondary | ICD-10-CM | POA: Diagnosis not present

## 2022-12-15 DIAGNOSIS — M1990 Unspecified osteoarthritis, unspecified site: Secondary | ICD-10-CM | POA: Diagnosis not present

## 2022-12-17 DIAGNOSIS — F0280 Dementia in other diseases classified elsewhere without behavioral disturbance: Secondary | ICD-10-CM | POA: Diagnosis not present

## 2022-12-17 DIAGNOSIS — K219 Gastro-esophageal reflux disease without esophagitis: Secondary | ICD-10-CM | POA: Diagnosis not present

## 2022-12-17 DIAGNOSIS — G301 Alzheimer's disease with late onset: Secondary | ICD-10-CM | POA: Diagnosis not present

## 2022-12-17 DIAGNOSIS — E785 Hyperlipidemia, unspecified: Secondary | ICD-10-CM | POA: Diagnosis not present

## 2022-12-17 DIAGNOSIS — M1990 Unspecified osteoarthritis, unspecified site: Secondary | ICD-10-CM | POA: Diagnosis not present

## 2022-12-17 DIAGNOSIS — I10 Essential (primary) hypertension: Secondary | ICD-10-CM | POA: Diagnosis not present

## 2022-12-19 DIAGNOSIS — E785 Hyperlipidemia, unspecified: Secondary | ICD-10-CM | POA: Diagnosis not present

## 2022-12-19 DIAGNOSIS — I10 Essential (primary) hypertension: Secondary | ICD-10-CM | POA: Diagnosis not present

## 2022-12-19 DIAGNOSIS — G301 Alzheimer's disease with late onset: Secondary | ICD-10-CM | POA: Diagnosis not present

## 2022-12-19 DIAGNOSIS — M1990 Unspecified osteoarthritis, unspecified site: Secondary | ICD-10-CM | POA: Diagnosis not present

## 2022-12-19 DIAGNOSIS — K219 Gastro-esophageal reflux disease without esophagitis: Secondary | ICD-10-CM | POA: Diagnosis not present

## 2022-12-19 DIAGNOSIS — F0280 Dementia in other diseases classified elsewhere without behavioral disturbance: Secondary | ICD-10-CM | POA: Diagnosis not present

## 2022-12-20 DIAGNOSIS — F0280 Dementia in other diseases classified elsewhere without behavioral disturbance: Secondary | ICD-10-CM | POA: Diagnosis not present

## 2022-12-20 DIAGNOSIS — G301 Alzheimer's disease with late onset: Secondary | ICD-10-CM | POA: Diagnosis not present

## 2022-12-20 DIAGNOSIS — M1990 Unspecified osteoarthritis, unspecified site: Secondary | ICD-10-CM | POA: Diagnosis not present

## 2022-12-20 DIAGNOSIS — K219 Gastro-esophageal reflux disease without esophagitis: Secondary | ICD-10-CM | POA: Diagnosis not present

## 2022-12-20 DIAGNOSIS — E785 Hyperlipidemia, unspecified: Secondary | ICD-10-CM | POA: Diagnosis not present

## 2022-12-20 DIAGNOSIS — I10 Essential (primary) hypertension: Secondary | ICD-10-CM | POA: Diagnosis not present

## 2022-12-22 DIAGNOSIS — M1990 Unspecified osteoarthritis, unspecified site: Secondary | ICD-10-CM | POA: Diagnosis not present

## 2022-12-22 DIAGNOSIS — K219 Gastro-esophageal reflux disease without esophagitis: Secondary | ICD-10-CM | POA: Diagnosis not present

## 2022-12-22 DIAGNOSIS — F0280 Dementia in other diseases classified elsewhere without behavioral disturbance: Secondary | ICD-10-CM | POA: Diagnosis not present

## 2022-12-22 DIAGNOSIS — I10 Essential (primary) hypertension: Secondary | ICD-10-CM | POA: Diagnosis not present

## 2022-12-22 DIAGNOSIS — G301 Alzheimer's disease with late onset: Secondary | ICD-10-CM | POA: Diagnosis not present

## 2022-12-22 DIAGNOSIS — E785 Hyperlipidemia, unspecified: Secondary | ICD-10-CM | POA: Diagnosis not present

## 2022-12-24 DIAGNOSIS — E785 Hyperlipidemia, unspecified: Secondary | ICD-10-CM | POA: Diagnosis not present

## 2022-12-24 DIAGNOSIS — G301 Alzheimer's disease with late onset: Secondary | ICD-10-CM | POA: Diagnosis not present

## 2022-12-24 DIAGNOSIS — K219 Gastro-esophageal reflux disease without esophagitis: Secondary | ICD-10-CM | POA: Diagnosis not present

## 2022-12-24 DIAGNOSIS — M1990 Unspecified osteoarthritis, unspecified site: Secondary | ICD-10-CM | POA: Diagnosis not present

## 2022-12-24 DIAGNOSIS — I10 Essential (primary) hypertension: Secondary | ICD-10-CM | POA: Diagnosis not present

## 2022-12-24 DIAGNOSIS — F0280 Dementia in other diseases classified elsewhere without behavioral disturbance: Secondary | ICD-10-CM | POA: Diagnosis not present

## 2022-12-25 DIAGNOSIS — K219 Gastro-esophageal reflux disease without esophagitis: Secondary | ICD-10-CM | POA: Diagnosis not present

## 2022-12-25 DIAGNOSIS — G301 Alzheimer's disease with late onset: Secondary | ICD-10-CM | POA: Diagnosis not present

## 2022-12-25 DIAGNOSIS — M1990 Unspecified osteoarthritis, unspecified site: Secondary | ICD-10-CM | POA: Diagnosis not present

## 2022-12-25 DIAGNOSIS — E785 Hyperlipidemia, unspecified: Secondary | ICD-10-CM | POA: Diagnosis not present

## 2022-12-25 DIAGNOSIS — I10 Essential (primary) hypertension: Secondary | ICD-10-CM | POA: Diagnosis not present

## 2022-12-25 DIAGNOSIS — F0280 Dementia in other diseases classified elsewhere without behavioral disturbance: Secondary | ICD-10-CM | POA: Diagnosis not present

## 2022-12-26 DIAGNOSIS — K641 Second degree hemorrhoids: Secondary | ICD-10-CM | POA: Diagnosis not present

## 2022-12-26 DIAGNOSIS — G301 Alzheimer's disease with late onset: Secondary | ICD-10-CM | POA: Diagnosis not present

## 2022-12-26 DIAGNOSIS — I10 Essential (primary) hypertension: Secondary | ICD-10-CM | POA: Diagnosis not present

## 2022-12-26 DIAGNOSIS — G311 Senile degeneration of brain, not elsewhere classified: Secondary | ICD-10-CM | POA: Diagnosis not present

## 2022-12-26 DIAGNOSIS — E785 Hyperlipidemia, unspecified: Secondary | ICD-10-CM | POA: Diagnosis not present

## 2022-12-26 DIAGNOSIS — M1990 Unspecified osteoarthritis, unspecified site: Secondary | ICD-10-CM | POA: Diagnosis not present

## 2022-12-26 DIAGNOSIS — H409 Unspecified glaucoma: Secondary | ICD-10-CM | POA: Diagnosis not present

## 2022-12-26 DIAGNOSIS — F0280 Dementia in other diseases classified elsewhere without behavioral disturbance: Secondary | ICD-10-CM | POA: Diagnosis not present

## 2022-12-26 DIAGNOSIS — K219 Gastro-esophageal reflux disease without esophagitis: Secondary | ICD-10-CM | POA: Diagnosis not present

## 2022-12-26 DIAGNOSIS — D649 Anemia, unspecified: Secondary | ICD-10-CM | POA: Diagnosis not present

## 2022-12-27 DIAGNOSIS — G311 Senile degeneration of brain, not elsewhere classified: Secondary | ICD-10-CM | POA: Diagnosis not present

## 2022-12-27 DIAGNOSIS — I10 Essential (primary) hypertension: Secondary | ICD-10-CM | POA: Diagnosis not present

## 2022-12-27 DIAGNOSIS — G301 Alzheimer's disease with late onset: Secondary | ICD-10-CM | POA: Diagnosis not present

## 2022-12-27 DIAGNOSIS — E785 Hyperlipidemia, unspecified: Secondary | ICD-10-CM | POA: Diagnosis not present

## 2022-12-27 DIAGNOSIS — K219 Gastro-esophageal reflux disease without esophagitis: Secondary | ICD-10-CM | POA: Diagnosis not present

## 2022-12-27 DIAGNOSIS — F0280 Dementia in other diseases classified elsewhere without behavioral disturbance: Secondary | ICD-10-CM | POA: Diagnosis not present

## 2022-12-29 DIAGNOSIS — G301 Alzheimer's disease with late onset: Secondary | ICD-10-CM | POA: Diagnosis not present

## 2022-12-29 DIAGNOSIS — F0280 Dementia in other diseases classified elsewhere without behavioral disturbance: Secondary | ICD-10-CM | POA: Diagnosis not present

## 2022-12-29 DIAGNOSIS — I10 Essential (primary) hypertension: Secondary | ICD-10-CM | POA: Diagnosis not present

## 2022-12-29 DIAGNOSIS — K219 Gastro-esophageal reflux disease without esophagitis: Secondary | ICD-10-CM | POA: Diagnosis not present

## 2022-12-29 DIAGNOSIS — G311 Senile degeneration of brain, not elsewhere classified: Secondary | ICD-10-CM | POA: Diagnosis not present

## 2022-12-29 DIAGNOSIS — E785 Hyperlipidemia, unspecified: Secondary | ICD-10-CM | POA: Diagnosis not present

## 2022-12-31 DIAGNOSIS — E785 Hyperlipidemia, unspecified: Secondary | ICD-10-CM | POA: Diagnosis not present

## 2022-12-31 DIAGNOSIS — I10 Essential (primary) hypertension: Secondary | ICD-10-CM | POA: Diagnosis not present

## 2022-12-31 DIAGNOSIS — G311 Senile degeneration of brain, not elsewhere classified: Secondary | ICD-10-CM | POA: Diagnosis not present

## 2022-12-31 DIAGNOSIS — G301 Alzheimer's disease with late onset: Secondary | ICD-10-CM | POA: Diagnosis not present

## 2022-12-31 DIAGNOSIS — F0280 Dementia in other diseases classified elsewhere without behavioral disturbance: Secondary | ICD-10-CM | POA: Diagnosis not present

## 2022-12-31 DIAGNOSIS — K219 Gastro-esophageal reflux disease without esophagitis: Secondary | ICD-10-CM | POA: Diagnosis not present

## 2023-01-02 DIAGNOSIS — I10 Essential (primary) hypertension: Secondary | ICD-10-CM | POA: Diagnosis not present

## 2023-01-02 DIAGNOSIS — G311 Senile degeneration of brain, not elsewhere classified: Secondary | ICD-10-CM | POA: Diagnosis not present

## 2023-01-02 DIAGNOSIS — E785 Hyperlipidemia, unspecified: Secondary | ICD-10-CM | POA: Diagnosis not present

## 2023-01-02 DIAGNOSIS — G301 Alzheimer's disease with late onset: Secondary | ICD-10-CM | POA: Diagnosis not present

## 2023-01-02 DIAGNOSIS — K219 Gastro-esophageal reflux disease without esophagitis: Secondary | ICD-10-CM | POA: Diagnosis not present

## 2023-01-02 DIAGNOSIS — F0280 Dementia in other diseases classified elsewhere without behavioral disturbance: Secondary | ICD-10-CM | POA: Diagnosis not present

## 2023-01-03 DIAGNOSIS — G301 Alzheimer's disease with late onset: Secondary | ICD-10-CM | POA: Diagnosis not present

## 2023-01-03 DIAGNOSIS — G311 Senile degeneration of brain, not elsewhere classified: Secondary | ICD-10-CM | POA: Diagnosis not present

## 2023-01-03 DIAGNOSIS — E785 Hyperlipidemia, unspecified: Secondary | ICD-10-CM | POA: Diagnosis not present

## 2023-01-03 DIAGNOSIS — F0280 Dementia in other diseases classified elsewhere without behavioral disturbance: Secondary | ICD-10-CM | POA: Diagnosis not present

## 2023-01-03 DIAGNOSIS — K219 Gastro-esophageal reflux disease without esophagitis: Secondary | ICD-10-CM | POA: Diagnosis not present

## 2023-01-03 DIAGNOSIS — I10 Essential (primary) hypertension: Secondary | ICD-10-CM | POA: Diagnosis not present

## 2023-01-05 ENCOUNTER — Ambulatory Visit: Payer: Self-pay | Admitting: Nurse Practitioner

## 2023-01-05 DIAGNOSIS — I10 Essential (primary) hypertension: Secondary | ICD-10-CM | POA: Diagnosis not present

## 2023-01-05 DIAGNOSIS — K219 Gastro-esophageal reflux disease without esophagitis: Secondary | ICD-10-CM | POA: Diagnosis not present

## 2023-01-05 DIAGNOSIS — G311 Senile degeneration of brain, not elsewhere classified: Secondary | ICD-10-CM | POA: Diagnosis not present

## 2023-01-05 DIAGNOSIS — E785 Hyperlipidemia, unspecified: Secondary | ICD-10-CM | POA: Diagnosis not present

## 2023-01-05 DIAGNOSIS — G301 Alzheimer's disease with late onset: Secondary | ICD-10-CM | POA: Diagnosis not present

## 2023-01-05 DIAGNOSIS — F0280 Dementia in other diseases classified elsewhere without behavioral disturbance: Secondary | ICD-10-CM | POA: Diagnosis not present

## 2023-01-07 DIAGNOSIS — F0280 Dementia in other diseases classified elsewhere without behavioral disturbance: Secondary | ICD-10-CM | POA: Diagnosis not present

## 2023-01-07 DIAGNOSIS — G301 Alzheimer's disease with late onset: Secondary | ICD-10-CM | POA: Diagnosis not present

## 2023-01-07 DIAGNOSIS — G311 Senile degeneration of brain, not elsewhere classified: Secondary | ICD-10-CM | POA: Diagnosis not present

## 2023-01-07 DIAGNOSIS — E785 Hyperlipidemia, unspecified: Secondary | ICD-10-CM | POA: Diagnosis not present

## 2023-01-07 DIAGNOSIS — I10 Essential (primary) hypertension: Secondary | ICD-10-CM | POA: Diagnosis not present

## 2023-01-07 DIAGNOSIS — K219 Gastro-esophageal reflux disease without esophagitis: Secondary | ICD-10-CM | POA: Diagnosis not present

## 2023-01-10 ENCOUNTER — Ambulatory Visit: Payer: Self-pay | Admitting: Licensed Clinical Social Worker

## 2023-01-10 DIAGNOSIS — I10 Essential (primary) hypertension: Secondary | ICD-10-CM | POA: Diagnosis not present

## 2023-01-10 DIAGNOSIS — F0280 Dementia in other diseases classified elsewhere without behavioral disturbance: Secondary | ICD-10-CM | POA: Diagnosis not present

## 2023-01-10 DIAGNOSIS — E785 Hyperlipidemia, unspecified: Secondary | ICD-10-CM | POA: Diagnosis not present

## 2023-01-10 DIAGNOSIS — G301 Alzheimer's disease with late onset: Secondary | ICD-10-CM | POA: Diagnosis not present

## 2023-01-10 DIAGNOSIS — G311 Senile degeneration of brain, not elsewhere classified: Secondary | ICD-10-CM | POA: Diagnosis not present

## 2023-01-10 DIAGNOSIS — K219 Gastro-esophageal reflux disease without esophagitis: Secondary | ICD-10-CM | POA: Diagnosis not present

## 2023-01-12 DIAGNOSIS — I10 Essential (primary) hypertension: Secondary | ICD-10-CM | POA: Diagnosis not present

## 2023-01-12 DIAGNOSIS — K219 Gastro-esophageal reflux disease without esophagitis: Secondary | ICD-10-CM | POA: Diagnosis not present

## 2023-01-12 DIAGNOSIS — G301 Alzheimer's disease with late onset: Secondary | ICD-10-CM | POA: Diagnosis not present

## 2023-01-12 DIAGNOSIS — E785 Hyperlipidemia, unspecified: Secondary | ICD-10-CM | POA: Diagnosis not present

## 2023-01-12 DIAGNOSIS — F0280 Dementia in other diseases classified elsewhere without behavioral disturbance: Secondary | ICD-10-CM | POA: Diagnosis not present

## 2023-01-12 DIAGNOSIS — G311 Senile degeneration of brain, not elsewhere classified: Secondary | ICD-10-CM | POA: Diagnosis not present

## 2023-01-14 DIAGNOSIS — G301 Alzheimer's disease with late onset: Secondary | ICD-10-CM | POA: Diagnosis not present

## 2023-01-14 DIAGNOSIS — K219 Gastro-esophageal reflux disease without esophagitis: Secondary | ICD-10-CM | POA: Diagnosis not present

## 2023-01-14 DIAGNOSIS — E785 Hyperlipidemia, unspecified: Secondary | ICD-10-CM | POA: Diagnosis not present

## 2023-01-14 DIAGNOSIS — G311 Senile degeneration of brain, not elsewhere classified: Secondary | ICD-10-CM | POA: Diagnosis not present

## 2023-01-14 DIAGNOSIS — I10 Essential (primary) hypertension: Secondary | ICD-10-CM | POA: Diagnosis not present

## 2023-01-14 DIAGNOSIS — F0280 Dementia in other diseases classified elsewhere without behavioral disturbance: Secondary | ICD-10-CM | POA: Diagnosis not present

## 2023-01-14 NOTE — Patient Instructions (Signed)
Visit Information  Thank you for taking time to visit with me today. Please don't hesitate to contact me if I can be of assistance to you.   Following are the goals we discussed today:   Goals Addressed             This Visit's Progress    Obtain Support Resources-Caregiver Strain   On track    Activities and task to complete in order to accomplish goals.   Keep all upcoming appointments discussed today Continue with compliance of taking medication prescribed by Doctor Implement healthy coping skills discussed to assist with management of symptoms Continue working with [facility] care team to assist with goals identified Continue working with hospice team to assist with LTC placement. Complete application and submit Medicaid application, when able          Our next appointment is by telephone on 02/03 at 9:30 AM  Please call the care guide team at [phone] if you need to cancel or reschedule your appointment.   If you are experiencing a Mental Health or Behavioral Health Crisis or need someone to talk to, please call the Suicide and Crisis Lifeline: 988 call 911   Patient verbalizes understanding of instructions and care plan provided today and agrees to view in MyChart. Active MyChart status and patient understanding of how to access instructions and care plan via MyChart confirmed with patient.     Jenel Lucks, MSW, LCSW [facility] Care Management [place]  [facility] [email or web address] Phone [phone] 5:25 AM

## 2023-01-14 NOTE — Patient Outreach (Signed)
  Care Coordination   Follow Up Visit Note   01/10/2023 Name: Tammy [name] MRN: [id number] DOB: December 14, 1933  Tammy [name] is a 89 y.o. year old female who sees Arnette Felts, FNP for primary care. I spoke with  Tammy [name]'s niece by phone today.  What matters to the patients health and wellness today?  LTC    Goals Addressed             This Visit's Progress    Obtain Support Resources-Caregiver Strain   On track    Activities and task to complete in order to accomplish goals.   Keep all upcoming appointments discussed today Continue with compliance of taking medication prescribed by Doctor Implement healthy coping skills discussed to assist with management of symptoms Continue working with [facility] care team to assist with goals identified Continue working with hospice team to assist with LTC placement. Complete application and submit Medicaid application, when able          SDOH assessments and interventions completed:  No     Care Coordination Interventions:  Yes, provided  Interventions Today    Flowsheet Row Most Recent Value  Chronic Disease   Chronic disease during today's visit Other  [Alzheimer's Disease]  General Interventions   General Interventions Discussed/Reviewed General Interventions Reviewed, Level of Care, Doctor Visits  Doctor Visits Discussed/Reviewed Doctor Visits Reviewed  Level of Care Applications  [LTC]  Applications Medicaid  Education Interventions   Applications Medicaid  Mental Health Interventions   Mental Health Discussed/Reviewed Mental Health Reviewed, Coping Strategies  Safety Interventions   Safety Discussed/Reviewed Safety Reviewed, Fall Risk  Advanced Directive Interventions   Advanced Directives Discussed/Reviewed End of Life  End of Life Hospice       Follow up plan: Follow up call scheduled for 4-6 weeks    Encounter Outcome:  Patient Visit Completed   Jenel Lucks, MSW, LCSW [facility] Care Management [facility]  Health  [facility] [email or web address] Phone [phone] 5:23 AM

## 2023-01-17 DIAGNOSIS — K219 Gastro-esophageal reflux disease without esophagitis: Secondary | ICD-10-CM | POA: Diagnosis not present

## 2023-01-17 DIAGNOSIS — G301 Alzheimer's disease with late onset: Secondary | ICD-10-CM | POA: Diagnosis not present

## 2023-01-17 DIAGNOSIS — E785 Hyperlipidemia, unspecified: Secondary | ICD-10-CM | POA: Diagnosis not present

## 2023-01-17 DIAGNOSIS — I10 Essential (primary) hypertension: Secondary | ICD-10-CM | POA: Diagnosis not present

## 2023-01-17 DIAGNOSIS — F0280 Dementia in other diseases classified elsewhere without behavioral disturbance: Secondary | ICD-10-CM | POA: Diagnosis not present

## 2023-01-17 DIAGNOSIS — G311 Senile degeneration of brain, not elsewhere classified: Secondary | ICD-10-CM | POA: Diagnosis not present

## 2023-01-19 DIAGNOSIS — I10 Essential (primary) hypertension: Secondary | ICD-10-CM | POA: Diagnosis not present

## 2023-01-19 DIAGNOSIS — G311 Senile degeneration of brain, not elsewhere classified: Secondary | ICD-10-CM | POA: Diagnosis not present

## 2023-01-19 DIAGNOSIS — G301 Alzheimer's disease with late onset: Secondary | ICD-10-CM | POA: Diagnosis not present

## 2023-01-19 DIAGNOSIS — F0280 Dementia in other diseases classified elsewhere without behavioral disturbance: Secondary | ICD-10-CM | POA: Diagnosis not present

## 2023-01-19 DIAGNOSIS — K219 Gastro-esophageal reflux disease without esophagitis: Secondary | ICD-10-CM | POA: Diagnosis not present

## 2023-01-19 DIAGNOSIS — E785 Hyperlipidemia, unspecified: Secondary | ICD-10-CM | POA: Diagnosis not present

## 2023-01-20 DIAGNOSIS — I10 Essential (primary) hypertension: Secondary | ICD-10-CM | POA: Diagnosis not present

## 2023-01-20 DIAGNOSIS — F0280 Dementia in other diseases classified elsewhere without behavioral disturbance: Secondary | ICD-10-CM | POA: Diagnosis not present

## 2023-01-20 DIAGNOSIS — K219 Gastro-esophageal reflux disease without esophagitis: Secondary | ICD-10-CM | POA: Diagnosis not present

## 2023-01-20 DIAGNOSIS — G301 Alzheimer's disease with late onset: Secondary | ICD-10-CM | POA: Diagnosis not present

## 2023-01-20 DIAGNOSIS — G311 Senile degeneration of brain, not elsewhere classified: Secondary | ICD-10-CM | POA: Diagnosis not present

## 2023-01-20 DIAGNOSIS — E785 Hyperlipidemia, unspecified: Secondary | ICD-10-CM | POA: Diagnosis not present

## 2023-01-21 DIAGNOSIS — F0280 Dementia in other diseases classified elsewhere without behavioral disturbance: Secondary | ICD-10-CM | POA: Diagnosis not present

## 2023-01-21 DIAGNOSIS — I10 Essential (primary) hypertension: Secondary | ICD-10-CM | POA: Diagnosis not present

## 2023-01-21 DIAGNOSIS — G301 Alzheimer's disease with late onset: Secondary | ICD-10-CM | POA: Diagnosis not present

## 2023-01-21 DIAGNOSIS — E785 Hyperlipidemia, unspecified: Secondary | ICD-10-CM | POA: Diagnosis not present

## 2023-01-21 DIAGNOSIS — G311 Senile degeneration of brain, not elsewhere classified: Secondary | ICD-10-CM | POA: Diagnosis not present

## 2023-01-21 DIAGNOSIS — K219 Gastro-esophageal reflux disease without esophagitis: Secondary | ICD-10-CM | POA: Diagnosis not present

## 2023-01-24 DIAGNOSIS — G301 Alzheimer's disease with late onset: Secondary | ICD-10-CM | POA: Diagnosis not present

## 2023-01-24 DIAGNOSIS — K219 Gastro-esophageal reflux disease without esophagitis: Secondary | ICD-10-CM | POA: Diagnosis not present

## 2023-01-24 DIAGNOSIS — I10 Essential (primary) hypertension: Secondary | ICD-10-CM | POA: Diagnosis not present

## 2023-01-24 DIAGNOSIS — E785 Hyperlipidemia, unspecified: Secondary | ICD-10-CM | POA: Diagnosis not present

## 2023-01-24 DIAGNOSIS — G311 Senile degeneration of brain, not elsewhere classified: Secondary | ICD-10-CM | POA: Diagnosis not present

## 2023-01-24 DIAGNOSIS — F0280 Dementia in other diseases classified elsewhere without behavioral disturbance: Secondary | ICD-10-CM | POA: Diagnosis not present

## 2023-01-26 DIAGNOSIS — H409 Unspecified glaucoma: Secondary | ICD-10-CM | POA: Diagnosis not present

## 2023-01-26 DIAGNOSIS — I10 Essential (primary) hypertension: Secondary | ICD-10-CM | POA: Diagnosis not present

## 2023-01-26 DIAGNOSIS — F0280 Dementia in other diseases classified elsewhere without behavioral disturbance: Secondary | ICD-10-CM | POA: Diagnosis not present

## 2023-01-26 DIAGNOSIS — K219 Gastro-esophageal reflux disease without esophagitis: Secondary | ICD-10-CM | POA: Diagnosis not present

## 2023-01-26 DIAGNOSIS — K641 Second degree hemorrhoids: Secondary | ICD-10-CM | POA: Diagnosis not present

## 2023-01-26 DIAGNOSIS — M1990 Unspecified osteoarthritis, unspecified site: Secondary | ICD-10-CM | POA: Diagnosis not present

## 2023-01-26 DIAGNOSIS — G311 Senile degeneration of brain, not elsewhere classified: Secondary | ICD-10-CM | POA: Diagnosis not present

## 2023-01-26 DIAGNOSIS — G301 Alzheimer's disease with late onset: Secondary | ICD-10-CM | POA: Diagnosis not present

## 2023-01-26 DIAGNOSIS — D649 Anemia, unspecified: Secondary | ICD-10-CM | POA: Diagnosis not present

## 2023-01-26 DIAGNOSIS — E785 Hyperlipidemia, unspecified: Secondary | ICD-10-CM | POA: Diagnosis not present

## 2023-01-27 DIAGNOSIS — F0280 Dementia in other diseases classified elsewhere without behavioral disturbance: Secondary | ICD-10-CM | POA: Diagnosis not present

## 2023-01-27 DIAGNOSIS — G301 Alzheimer's disease with late onset: Secondary | ICD-10-CM | POA: Diagnosis not present

## 2023-01-27 DIAGNOSIS — G311 Senile degeneration of brain, not elsewhere classified: Secondary | ICD-10-CM | POA: Diagnosis not present

## 2023-01-27 DIAGNOSIS — E785 Hyperlipidemia, unspecified: Secondary | ICD-10-CM | POA: Diagnosis not present

## 2023-01-27 DIAGNOSIS — K219 Gastro-esophageal reflux disease without esophagitis: Secondary | ICD-10-CM | POA: Diagnosis not present

## 2023-01-27 DIAGNOSIS — I10 Essential (primary) hypertension: Secondary | ICD-10-CM | POA: Diagnosis not present

## 2023-01-28 DIAGNOSIS — G301 Alzheimer's disease with late onset: Secondary | ICD-10-CM | POA: Diagnosis not present

## 2023-01-28 DIAGNOSIS — K219 Gastro-esophageal reflux disease without esophagitis: Secondary | ICD-10-CM | POA: Diagnosis not present

## 2023-01-28 DIAGNOSIS — I10 Essential (primary) hypertension: Secondary | ICD-10-CM | POA: Diagnosis not present

## 2023-01-28 DIAGNOSIS — F0280 Dementia in other diseases classified elsewhere without behavioral disturbance: Secondary | ICD-10-CM | POA: Diagnosis not present

## 2023-01-28 DIAGNOSIS — E785 Hyperlipidemia, unspecified: Secondary | ICD-10-CM | POA: Diagnosis not present

## 2023-01-28 DIAGNOSIS — G311 Senile degeneration of brain, not elsewhere classified: Secondary | ICD-10-CM | POA: Diagnosis not present

## 2023-01-31 DIAGNOSIS — E785 Hyperlipidemia, unspecified: Secondary | ICD-10-CM | POA: Diagnosis not present

## 2023-01-31 DIAGNOSIS — F0280 Dementia in other diseases classified elsewhere without behavioral disturbance: Secondary | ICD-10-CM | POA: Diagnosis not present

## 2023-01-31 DIAGNOSIS — K219 Gastro-esophageal reflux disease without esophagitis: Secondary | ICD-10-CM | POA: Diagnosis not present

## 2023-01-31 DIAGNOSIS — G311 Senile degeneration of brain, not elsewhere classified: Secondary | ICD-10-CM | POA: Diagnosis not present

## 2023-01-31 DIAGNOSIS — G301 Alzheimer's disease with late onset: Secondary | ICD-10-CM | POA: Diagnosis not present

## 2023-01-31 DIAGNOSIS — I10 Essential (primary) hypertension: Secondary | ICD-10-CM | POA: Diagnosis not present

## 2023-02-02 DIAGNOSIS — K219 Gastro-esophageal reflux disease without esophagitis: Secondary | ICD-10-CM | POA: Diagnosis not present

## 2023-02-02 DIAGNOSIS — I10 Essential (primary) hypertension: Secondary | ICD-10-CM | POA: Diagnosis not present

## 2023-02-02 DIAGNOSIS — G311 Senile degeneration of brain, not elsewhere classified: Secondary | ICD-10-CM | POA: Diagnosis not present

## 2023-02-02 DIAGNOSIS — E785 Hyperlipidemia, unspecified: Secondary | ICD-10-CM | POA: Diagnosis not present

## 2023-02-02 DIAGNOSIS — G301 Alzheimer's disease with late onset: Secondary | ICD-10-CM | POA: Diagnosis not present

## 2023-02-02 DIAGNOSIS — F0280 Dementia in other diseases classified elsewhere without behavioral disturbance: Secondary | ICD-10-CM | POA: Diagnosis not present

## 2023-02-03 ENCOUNTER — Other Ambulatory Visit: Payer: Self-pay | Admitting: Nurse Practitioner

## 2023-02-03 DIAGNOSIS — G301 Alzheimer's disease with late onset: Secondary | ICD-10-CM | POA: Diagnosis not present

## 2023-02-03 DIAGNOSIS — K219 Gastro-esophageal reflux disease without esophagitis: Secondary | ICD-10-CM | POA: Diagnosis not present

## 2023-02-03 DIAGNOSIS — Z7401 Bed confinement status: Secondary | ICD-10-CM | POA: Diagnosis not present

## 2023-02-03 DIAGNOSIS — G311 Senile degeneration of brain, not elsewhere classified: Secondary | ICD-10-CM | POA: Diagnosis not present

## 2023-02-03 DIAGNOSIS — I10 Essential (primary) hypertension: Secondary | ICD-10-CM | POA: Diagnosis not present

## 2023-02-03 DIAGNOSIS — F0280 Dementia in other diseases classified elsewhere without behavioral disturbance: Secondary | ICD-10-CM | POA: Diagnosis not present

## 2023-02-03 DIAGNOSIS — R29898 Other symptoms and signs involving the musculoskeletal system: Secondary | ICD-10-CM | POA: Diagnosis not present

## 2023-02-03 DIAGNOSIS — E785 Hyperlipidemia, unspecified: Secondary | ICD-10-CM | POA: Diagnosis not present

## 2023-02-04 DIAGNOSIS — G301 Alzheimer's disease with late onset: Secondary | ICD-10-CM | POA: Diagnosis not present

## 2023-02-04 DIAGNOSIS — I10 Essential (primary) hypertension: Secondary | ICD-10-CM | POA: Diagnosis not present

## 2023-02-04 DIAGNOSIS — G311 Senile degeneration of brain, not elsewhere classified: Secondary | ICD-10-CM | POA: Diagnosis not present

## 2023-02-04 DIAGNOSIS — E785 Hyperlipidemia, unspecified: Secondary | ICD-10-CM | POA: Diagnosis not present

## 2023-02-04 DIAGNOSIS — K219 Gastro-esophageal reflux disease without esophagitis: Secondary | ICD-10-CM | POA: Diagnosis not present

## 2023-02-04 DIAGNOSIS — F0280 Dementia in other diseases classified elsewhere without behavioral disturbance: Secondary | ICD-10-CM | POA: Diagnosis not present

## 2023-02-05 DIAGNOSIS — G301 Alzheimer's disease with late onset: Secondary | ICD-10-CM | POA: Diagnosis not present

## 2023-02-05 DIAGNOSIS — K219 Gastro-esophageal reflux disease without esophagitis: Secondary | ICD-10-CM | POA: Diagnosis not present

## 2023-02-05 DIAGNOSIS — G311 Senile degeneration of brain, not elsewhere classified: Secondary | ICD-10-CM | POA: Diagnosis not present

## 2023-02-05 DIAGNOSIS — I10 Essential (primary) hypertension: Secondary | ICD-10-CM | POA: Diagnosis not present

## 2023-02-05 DIAGNOSIS — E785 Hyperlipidemia, unspecified: Secondary | ICD-10-CM | POA: Diagnosis not present

## 2023-02-05 DIAGNOSIS — F0280 Dementia in other diseases classified elsewhere without behavioral disturbance: Secondary | ICD-10-CM | POA: Diagnosis not present

## 2023-02-06 DIAGNOSIS — E785 Hyperlipidemia, unspecified: Secondary | ICD-10-CM | POA: Diagnosis not present

## 2023-02-06 DIAGNOSIS — G301 Alzheimer's disease with late onset: Secondary | ICD-10-CM | POA: Diagnosis not present

## 2023-02-06 DIAGNOSIS — F0280 Dementia in other diseases classified elsewhere without behavioral disturbance: Secondary | ICD-10-CM | POA: Diagnosis not present

## 2023-02-06 DIAGNOSIS — G311 Senile degeneration of brain, not elsewhere classified: Secondary | ICD-10-CM | POA: Diagnosis not present

## 2023-02-06 DIAGNOSIS — I10 Essential (primary) hypertension: Secondary | ICD-10-CM | POA: Diagnosis not present

## 2023-02-06 DIAGNOSIS — K219 Gastro-esophageal reflux disease without esophagitis: Secondary | ICD-10-CM | POA: Diagnosis not present

## 2023-02-07 DIAGNOSIS — G311 Senile degeneration of brain, not elsewhere classified: Secondary | ICD-10-CM | POA: Diagnosis not present

## 2023-02-07 DIAGNOSIS — I10 Essential (primary) hypertension: Secondary | ICD-10-CM | POA: Diagnosis not present

## 2023-02-07 DIAGNOSIS — F0280 Dementia in other diseases classified elsewhere without behavioral disturbance: Secondary | ICD-10-CM | POA: Diagnosis not present

## 2023-02-07 DIAGNOSIS — E785 Hyperlipidemia, unspecified: Secondary | ICD-10-CM | POA: Diagnosis not present

## 2023-02-07 DIAGNOSIS — G301 Alzheimer's disease with late onset: Secondary | ICD-10-CM | POA: Diagnosis not present

## 2023-02-07 DIAGNOSIS — K219 Gastro-esophageal reflux disease without esophagitis: Secondary | ICD-10-CM | POA: Diagnosis not present

## 2023-02-08 DIAGNOSIS — F0280 Dementia in other diseases classified elsewhere without behavioral disturbance: Secondary | ICD-10-CM | POA: Diagnosis not present

## 2023-02-08 DIAGNOSIS — G311 Senile degeneration of brain, not elsewhere classified: Secondary | ICD-10-CM | POA: Diagnosis not present

## 2023-02-08 DIAGNOSIS — E785 Hyperlipidemia, unspecified: Secondary | ICD-10-CM | POA: Diagnosis not present

## 2023-02-08 DIAGNOSIS — I10 Essential (primary) hypertension: Secondary | ICD-10-CM | POA: Diagnosis not present

## 2023-02-08 DIAGNOSIS — K219 Gastro-esophageal reflux disease without esophagitis: Secondary | ICD-10-CM | POA: Diagnosis not present

## 2023-02-08 DIAGNOSIS — G301 Alzheimer's disease with late onset: Secondary | ICD-10-CM | POA: Diagnosis not present

## 2023-02-09 DIAGNOSIS — F0280 Dementia in other diseases classified elsewhere without behavioral disturbance: Secondary | ICD-10-CM | POA: Diagnosis not present

## 2023-02-09 DIAGNOSIS — G301 Alzheimer's disease with late onset: Secondary | ICD-10-CM | POA: Diagnosis not present

## 2023-02-09 DIAGNOSIS — E785 Hyperlipidemia, unspecified: Secondary | ICD-10-CM | POA: Diagnosis not present

## 2023-02-09 DIAGNOSIS — K219 Gastro-esophageal reflux disease without esophagitis: Secondary | ICD-10-CM | POA: Diagnosis not present

## 2023-02-09 DIAGNOSIS — G311 Senile degeneration of brain, not elsewhere classified: Secondary | ICD-10-CM | POA: Diagnosis not present

## 2023-02-09 DIAGNOSIS — I10 Essential (primary) hypertension: Secondary | ICD-10-CM | POA: Diagnosis not present

## 2023-02-11 ENCOUNTER — Ambulatory Visit: Payer: Self-pay

## 2023-02-11 DIAGNOSIS — G301 Alzheimer's disease with late onset: Secondary | ICD-10-CM | POA: Diagnosis not present

## 2023-02-11 DIAGNOSIS — E785 Hyperlipidemia, unspecified: Secondary | ICD-10-CM | POA: Diagnosis not present

## 2023-02-11 DIAGNOSIS — F0280 Dementia in other diseases classified elsewhere without behavioral disturbance: Secondary | ICD-10-CM | POA: Diagnosis not present

## 2023-02-11 DIAGNOSIS — G311 Senile degeneration of brain, not elsewhere classified: Secondary | ICD-10-CM | POA: Diagnosis not present

## 2023-02-11 DIAGNOSIS — K219 Gastro-esophageal reflux disease without esophagitis: Secondary | ICD-10-CM | POA: Diagnosis not present

## 2023-02-11 DIAGNOSIS — I10 Essential (primary) hypertension: Secondary | ICD-10-CM | POA: Diagnosis not present

## 2023-02-11 NOTE — Patient Outreach (Signed)
  Care Coordination   Follow Up Visit Note   02/11/2023 Name: Tammy [name] MRN: [id number] DOB: 1933-03-09  Tammy [name] is a 89 y.o. year old female who sees Arnette Felts, FNP for primary care. I spoke with  Tammy [name] by phone today.  What matters to the patients health and wellness today?  Patient's niece Tammy [name] would like assistance with completing her aunt's long term care Medicaid application.    Goals Addressed             This Visit's Progress    To receive Hospice for comfort care   On track    Care Coordination Interventions: Completed successful outbound call with patient's POA and niece Tammy [name] Evaluation of current treatment plan related to dementia w/worsening physical and mental decline and patient's adherence to plan as established by provider Determined patient continues to be under the care of Hospice, she remains to be stable and receiving excellent care Discussed plans with patient for ongoing care coordination follow up and provided patient with direct contact information for nurse care coordinator    Interventions Today    Flowsheet Row Most Recent Value  Chronic Disease   Chronic disease during today's visit Other  [dementia]  General Interventions   General Interventions Discussed/Reviewed General Interventions Reviewed, General Interventions Discussed, Communication with  Communication with Social Work  Tammy Lucks LCSW] Tammy [name] BSW]  Education Interventions   Education Provided Provided Education  Provided Verbal Education On Other, Mental Health/Coping with Illness  [long term Medicaid]  Mental Health Interventions   Mental Health Discussed/Reviewed Mental Health Discussed, Mental Health Reviewed, Coping Strategies  Advanced Directive Interventions   Advanced Directives Discussed/Reviewed End of Life  End of Life Hospice          SDOH assessments and interventions completed:  Yes  SDOH Interventions Today     Flowsheet Row Most Recent Value  SDOH Interventions   Food Insecurity Interventions Intervention Not Indicated  Housing Interventions Intervention Not Indicated  Transportation Interventions Intervention Not Indicated  Utilities Interventions Intervention Not Indicated        Care Coordination Interventions:  No, not indicated   Follow up plan: Follow up call scheduled for 04/11/23 @09 :30 AM    Encounter Outcome:  Patient Visit Completed

## 2023-02-11 NOTE — Patient Instructions (Signed)
Visit Information  Thank you for taking time to visit with me today. Please don't hesitate to contact me if I can be of assistance to you.   Following are the goals we discussed today:   Goals Addressed             This Visit's Progress    To receive Hospice for comfort care   On track    Care Coordination Interventions: Completed successful outbound call with patient's POA and niece [name] Evaluation of current treatment plan related to dementia w/worsening physical and mental decline and patient's adherence to plan as established by provider Determined patient continues to be under the care of Hospice, she remains to be stable and receiving excellent care Discussed plans with patient for ongoing care coordination follow up and provided patient with direct contact information for nurse care coordinator        Our next appointment is by telephone on 04/11/23 at 09:30 AM  Please call the care guide team at [phone] if you need to cancel or reschedule your appointment.   If you are experiencing a Mental Health or Behavioral Health Crisis or need someone to talk to, please call 1-800-273-TALK (toll free, 24 hour hotline)  Patient verbalizes understanding of instructions and care plan provided today and agrees to view in MyChart. Active MyChart status and patient understanding of how to access instructions and care plan via MyChart confirmed with patient.     Delsa Sale RN BSN CCM [place]  [facility], [facility] Health Nurse Care Coordinator  Direct Dial: [phone] Website: [email or web address]

## 2023-02-14 DIAGNOSIS — K219 Gastro-esophageal reflux disease without esophagitis: Secondary | ICD-10-CM | POA: Diagnosis not present

## 2023-02-14 DIAGNOSIS — F0280 Dementia in other diseases classified elsewhere without behavioral disturbance: Secondary | ICD-10-CM | POA: Diagnosis not present

## 2023-02-14 DIAGNOSIS — G301 Alzheimer's disease with late onset: Secondary | ICD-10-CM | POA: Diagnosis not present

## 2023-02-14 DIAGNOSIS — E785 Hyperlipidemia, unspecified: Secondary | ICD-10-CM | POA: Diagnosis not present

## 2023-02-14 DIAGNOSIS — G311 Senile degeneration of brain, not elsewhere classified: Secondary | ICD-10-CM | POA: Diagnosis not present

## 2023-02-14 DIAGNOSIS — I10 Essential (primary) hypertension: Secondary | ICD-10-CM | POA: Diagnosis not present

## 2023-02-16 DIAGNOSIS — E785 Hyperlipidemia, unspecified: Secondary | ICD-10-CM | POA: Diagnosis not present

## 2023-02-16 DIAGNOSIS — G301 Alzheimer's disease with late onset: Secondary | ICD-10-CM | POA: Diagnosis not present

## 2023-02-16 DIAGNOSIS — I10 Essential (primary) hypertension: Secondary | ICD-10-CM | POA: Diagnosis not present

## 2023-02-16 DIAGNOSIS — F0280 Dementia in other diseases classified elsewhere without behavioral disturbance: Secondary | ICD-10-CM | POA: Diagnosis not present

## 2023-02-16 DIAGNOSIS — K219 Gastro-esophageal reflux disease without esophagitis: Secondary | ICD-10-CM | POA: Diagnosis not present

## 2023-02-16 DIAGNOSIS — G311 Senile degeneration of brain, not elsewhere classified: Secondary | ICD-10-CM | POA: Diagnosis not present

## 2023-02-18 DIAGNOSIS — E785 Hyperlipidemia, unspecified: Secondary | ICD-10-CM | POA: Diagnosis not present

## 2023-02-18 DIAGNOSIS — K219 Gastro-esophageal reflux disease without esophagitis: Secondary | ICD-10-CM | POA: Diagnosis not present

## 2023-02-18 DIAGNOSIS — F0280 Dementia in other diseases classified elsewhere without behavioral disturbance: Secondary | ICD-10-CM | POA: Diagnosis not present

## 2023-02-18 DIAGNOSIS — G311 Senile degeneration of brain, not elsewhere classified: Secondary | ICD-10-CM | POA: Diagnosis not present

## 2023-02-18 DIAGNOSIS — G301 Alzheimer's disease with late onset: Secondary | ICD-10-CM | POA: Diagnosis not present

## 2023-02-18 DIAGNOSIS — I10 Essential (primary) hypertension: Secondary | ICD-10-CM | POA: Diagnosis not present

## 2023-02-21 DIAGNOSIS — I10 Essential (primary) hypertension: Secondary | ICD-10-CM | POA: Diagnosis not present

## 2023-02-21 DIAGNOSIS — G301 Alzheimer's disease with late onset: Secondary | ICD-10-CM | POA: Diagnosis not present

## 2023-02-21 DIAGNOSIS — K219 Gastro-esophageal reflux disease without esophagitis: Secondary | ICD-10-CM | POA: Diagnosis not present

## 2023-02-21 DIAGNOSIS — E785 Hyperlipidemia, unspecified: Secondary | ICD-10-CM | POA: Diagnosis not present

## 2023-02-21 DIAGNOSIS — F0280 Dementia in other diseases classified elsewhere without behavioral disturbance: Secondary | ICD-10-CM | POA: Diagnosis not present

## 2023-02-21 DIAGNOSIS — G311 Senile degeneration of brain, not elsewhere classified: Secondary | ICD-10-CM | POA: Diagnosis not present

## 2023-02-22 DIAGNOSIS — I10 Essential (primary) hypertension: Secondary | ICD-10-CM | POA: Diagnosis not present

## 2023-02-22 DIAGNOSIS — G311 Senile degeneration of brain, not elsewhere classified: Secondary | ICD-10-CM | POA: Diagnosis not present

## 2023-02-22 DIAGNOSIS — F0280 Dementia in other diseases classified elsewhere without behavioral disturbance: Secondary | ICD-10-CM | POA: Diagnosis not present

## 2023-02-22 DIAGNOSIS — G301 Alzheimer's disease with late onset: Secondary | ICD-10-CM | POA: Diagnosis not present

## 2023-02-22 DIAGNOSIS — E785 Hyperlipidemia, unspecified: Secondary | ICD-10-CM | POA: Diagnosis not present

## 2023-02-22 DIAGNOSIS — K219 Gastro-esophageal reflux disease without esophagitis: Secondary | ICD-10-CM | POA: Diagnosis not present

## 2023-02-23 DIAGNOSIS — G301 Alzheimer's disease with late onset: Secondary | ICD-10-CM | POA: Diagnosis not present

## 2023-02-23 DIAGNOSIS — K219 Gastro-esophageal reflux disease without esophagitis: Secondary | ICD-10-CM | POA: Diagnosis not present

## 2023-02-23 DIAGNOSIS — I10 Essential (primary) hypertension: Secondary | ICD-10-CM | POA: Diagnosis not present

## 2023-02-23 DIAGNOSIS — F0280 Dementia in other diseases classified elsewhere without behavioral disturbance: Secondary | ICD-10-CM | POA: Diagnosis not present

## 2023-02-23 DIAGNOSIS — E785 Hyperlipidemia, unspecified: Secondary | ICD-10-CM | POA: Diagnosis not present

## 2023-02-23 DIAGNOSIS — G311 Senile degeneration of brain, not elsewhere classified: Secondary | ICD-10-CM | POA: Diagnosis not present

## 2023-02-25 DIAGNOSIS — I10 Essential (primary) hypertension: Secondary | ICD-10-CM | POA: Diagnosis not present

## 2023-02-25 DIAGNOSIS — F0280 Dementia in other diseases classified elsewhere without behavioral disturbance: Secondary | ICD-10-CM | POA: Diagnosis not present

## 2023-02-25 DIAGNOSIS — K219 Gastro-esophageal reflux disease without esophagitis: Secondary | ICD-10-CM | POA: Diagnosis not present

## 2023-02-25 DIAGNOSIS — E785 Hyperlipidemia, unspecified: Secondary | ICD-10-CM | POA: Diagnosis not present

## 2023-02-25 DIAGNOSIS — G311 Senile degeneration of brain, not elsewhere classified: Secondary | ICD-10-CM | POA: Diagnosis not present

## 2023-02-25 DIAGNOSIS — G301 Alzheimer's disease with late onset: Secondary | ICD-10-CM | POA: Diagnosis not present

## 2023-02-26 DIAGNOSIS — G301 Alzheimer's disease with late onset: Secondary | ICD-10-CM | POA: Diagnosis not present

## 2023-02-26 DIAGNOSIS — E785 Hyperlipidemia, unspecified: Secondary | ICD-10-CM | POA: Diagnosis not present

## 2023-02-26 DIAGNOSIS — K641 Second degree hemorrhoids: Secondary | ICD-10-CM | POA: Diagnosis not present

## 2023-02-26 DIAGNOSIS — G311 Senile degeneration of brain, not elsewhere classified: Secondary | ICD-10-CM | POA: Diagnosis not present

## 2023-02-26 DIAGNOSIS — H409 Unspecified glaucoma: Secondary | ICD-10-CM | POA: Diagnosis not present

## 2023-02-26 DIAGNOSIS — F0280 Dementia in other diseases classified elsewhere without behavioral disturbance: Secondary | ICD-10-CM | POA: Diagnosis not present

## 2023-02-26 DIAGNOSIS — M1990 Unspecified osteoarthritis, unspecified site: Secondary | ICD-10-CM | POA: Diagnosis not present

## 2023-02-26 DIAGNOSIS — D649 Anemia, unspecified: Secondary | ICD-10-CM | POA: Diagnosis not present

## 2023-02-26 DIAGNOSIS — I10 Essential (primary) hypertension: Secondary | ICD-10-CM | POA: Diagnosis not present

## 2023-02-26 DIAGNOSIS — K219 Gastro-esophageal reflux disease without esophagitis: Secondary | ICD-10-CM | POA: Diagnosis not present

## 2023-02-28 ENCOUNTER — Ambulatory Visit: Payer: Self-pay | Admitting: Licensed Clinical Social Worker

## 2023-02-28 DIAGNOSIS — G301 Alzheimer's disease with late onset: Secondary | ICD-10-CM | POA: Diagnosis not present

## 2023-02-28 DIAGNOSIS — I10 Essential (primary) hypertension: Secondary | ICD-10-CM | POA: Diagnosis not present

## 2023-02-28 DIAGNOSIS — G311 Senile degeneration of brain, not elsewhere classified: Secondary | ICD-10-CM | POA: Diagnosis not present

## 2023-02-28 DIAGNOSIS — F0280 Dementia in other diseases classified elsewhere without behavioral disturbance: Secondary | ICD-10-CM | POA: Diagnosis not present

## 2023-02-28 DIAGNOSIS — E785 Hyperlipidemia, unspecified: Secondary | ICD-10-CM | POA: Diagnosis not present

## 2023-02-28 DIAGNOSIS — K219 Gastro-esophageal reflux disease without esophagitis: Secondary | ICD-10-CM | POA: Diagnosis not present

## 2023-02-28 NOTE — Patient Instructions (Signed)
Visit Information  Thank you for taking time to visit with me today. Please don't hesitate to contact me if I can be of assistance to you.   Following are the goals we discussed today:   Goals Addressed             This Visit's Progress    Obtain Support Resources-Caregiver Strain   On track    Activities and task to complete in order to accomplish goals.   Keep all upcoming appointments discussed today Continue with compliance of taking medication prescribed by Doctor Implement healthy coping skills discussed to assist with management of symptoms Continue working with [facility] care team to assist with goals identified F/up with DSS regarding submitted LTC [place] application          Our next appointment is by telephone on 4/7 at 9:30 AM  Please call the care guide team at [phone] if you need to cancel or reschedule your appointment.   If you are experiencing a Mental Health or Behavioral Health Crisis or need someone to talk to, please call the Suicide and Crisis Lifeline: 988 call 911   Patient verbalizes understanding of instructions and care plan provided today and agrees to view in MyChart. Active MyChart status and patient understanding of how to access instructions and care plan via MyChart confirmed with patient.     Windy Fast [facility] Health  [facility], [facility] Clinical Social Worker Direct Dial: [phone]  Fax: [phone] Website: [email or web address] 2:44 PM

## 2023-02-28 NOTE — Patient Instructions (Signed)
Visit Information  Thank you for taking time to visit with me today. Please don't hesitate to contact me if I can be of assistance to you.   Following are the goals we discussed today:   Goals Addressed             This Visit's Progress    Care Coordination Activities       Care Coordination Interventions: Patient's family needed assistance with completing a Long Term care applicaiton and they have completed and sent off.  LCSW has already spoken with the family and no other SDOH needs  No further follow up needed        No further follow up, patient is in hospice and family had already completed the paperwork that they needed assistance with.  Please call the care guide team at [phone] if you need to cancel or reschedule your appointment.   If you are experiencing a Mental Health or Behavioral Health Crisis or need someone to talk to, please call the Suicide and Crisis Lifeline: 988 go to [facility] Urgent [facility] [address] [place] [phone]) call 911  Patient verbalizes understanding of instructions and care plan provided today and agrees to view in MyChart. Active MyChart status and patient understanding of how to access instructions and care plan via MyChart confirmed with patient.     Jeanie Cooks, PhD [facility], [facility] Social Worker Direct Dial: [phone]  Fax: [phone]

## 2023-02-28 NOTE — Patient Outreach (Signed)
  Care Coordination   02/28/2023 Name: Tammy [name] MRN: [id number] DOB: 1933-03-14   Care Coordination Outreach Attempts:  An unsuccessful outreach was attempted for an appointment today.  Follow Up Plan:  Additional outreach attempts will be made to offer the patient complex care management information and services.   Encounter Outcome:  No Answer   Care Coordination Interventions:  No, not indicated    Jeanie Cooks, PhD [facility], [facility] Social Worker Direct Dial: [phone]  Fax: [phone]

## 2023-02-28 NOTE — Patient Outreach (Signed)
  Care Coordination   Initial Visit Note   02/28/2023 Name: Tammy [name] MRN: [id number] DOB: 10-24-1933  Tammy [name] is a 89 y.o. year old female who sees Arnette Felts, FNP for primary care. I spoke with  Darrelyn Hillock by phone today.  What matters to the patients health and wellness today?  Long term Care    Goals Addressed             This Visit's Progress    Care Coordination Activities       Care Coordination Interventions: Patient's family needed assistance with completing a Long Term care applicaiton and they have completed and sent off.  LCSW has already spoken with the family and no other SDOH needs  No further follow up needed        SDOH assessments and interventions completed:  Yes  SDOH Interventions Today    Flowsheet Row Most Recent Value  SDOH Interventions   Food Insecurity Interventions Intervention Not Indicated  Housing Interventions Intervention Not Indicated  Transportation Interventions Intervention Not Indicated  Utilities Interventions Intervention Not Indicated        Care Coordination Interventions:  Yes, provided  Interventions Today    Flowsheet Row Most Recent Value  General Interventions   General Interventions Discussed/Reviewed General Interventions Discussed  [Patient's family needed assistance with completing a Long Term care applicaiton and they have completed and sent off.]        Follow up plan: No further intervention required.   Encounter Outcome:  Patient Visit Completed   Jeanie Cooks, PhD [facility], [facility] Social Worker Direct Dial: [phone]  Fax: [phone]

## 2023-02-28 NOTE — Patient Outreach (Signed)
  Care Coordination   Follow Up Visit Note   02/28/2023 Name: Tammy [name] MRN: [id number] DOB: Aug 31, 1933  Tammy [name] is a 89 y.o. year old female who sees Arnette Felts, FNP for primary care. I spoke with  Heywood Bene Pigford's niece, [name], by phone today.  What matters to the patients health and wellness today?  Level of Care, Applications, Caregiver Stress    Goals Addressed             This Visit's Progress    Obtain Support Resources-Caregiver Strain   On track    Activities and task to complete in order to accomplish goals.   Keep all upcoming appointments discussed today Continue with compliance of taking medication prescribed by Doctor Implement healthy coping skills discussed to assist with management of symptoms Continue working with [place] care team to assist with goals identified F/up with DSS regarding submitted LTC MA application          SDOH assessments and interventions completed:  No     Care Coordination Interventions:  Yes, provided  Interventions Today    Flowsheet Row Most Recent Value  Chronic Disease   Chronic disease during today's visit Other  [Dementia]  General Interventions   General Interventions Discussed/Reviewed General Interventions Reviewed, Doctor Visits, Level of Care  Doctor Visits Discussed/Reviewed Doctor Visits Reviewed  Level of Care Personal Care Services, Skilled Nursing Facility  [Patient continues to receive aid services. Pt did well with respite care for five days last month, scheduled for another five this month. Family is interested in LTC NH, pending MA]  Mental Health Interventions   Mental Health Discussed/Reviewed Mental Health Reviewed, Coping Strategies  Nutrition Interventions   Nutrition Discussed/Reviewed Nutrition Reviewed  Pharmacy Interventions   Pharmacy Dicussed/Reviewed Pharmacy Topics Reviewed, Medication Adherence  Safety Interventions   Safety Discussed/Reviewed Safety Reviewed  Advanced Directive  Interventions   Advanced Directives Discussed/Reviewed End of Life  End of Life Hospice       Follow up plan: Follow up call scheduled for 6-8 weeks    Encounter Outcome:  Patient Visit Completed   Jenel Lucks, LCSW [place]  [facility], [facility] Clinical Social Worker Direct Dial: [phone]  Fax: [phone] Website: [email or web address] 2:44 PM

## 2023-03-02 DIAGNOSIS — I10 Essential (primary) hypertension: Secondary | ICD-10-CM | POA: Diagnosis not present

## 2023-03-02 DIAGNOSIS — E785 Hyperlipidemia, unspecified: Secondary | ICD-10-CM | POA: Diagnosis not present

## 2023-03-02 DIAGNOSIS — G311 Senile degeneration of brain, not elsewhere classified: Secondary | ICD-10-CM | POA: Diagnosis not present

## 2023-03-02 DIAGNOSIS — F0280 Dementia in other diseases classified elsewhere without behavioral disturbance: Secondary | ICD-10-CM | POA: Diagnosis not present

## 2023-03-02 DIAGNOSIS — G301 Alzheimer's disease with late onset: Secondary | ICD-10-CM | POA: Diagnosis not present

## 2023-03-02 DIAGNOSIS — K219 Gastro-esophageal reflux disease without esophagitis: Secondary | ICD-10-CM | POA: Diagnosis not present

## 2023-03-04 DIAGNOSIS — E785 Hyperlipidemia, unspecified: Secondary | ICD-10-CM | POA: Diagnosis not present

## 2023-03-04 DIAGNOSIS — F0280 Dementia in other diseases classified elsewhere without behavioral disturbance: Secondary | ICD-10-CM | POA: Diagnosis not present

## 2023-03-04 DIAGNOSIS — G311 Senile degeneration of brain, not elsewhere classified: Secondary | ICD-10-CM | POA: Diagnosis not present

## 2023-03-04 DIAGNOSIS — I10 Essential (primary) hypertension: Secondary | ICD-10-CM | POA: Diagnosis not present

## 2023-03-04 DIAGNOSIS — K219 Gastro-esophageal reflux disease without esophagitis: Secondary | ICD-10-CM | POA: Diagnosis not present

## 2023-03-04 DIAGNOSIS — G301 Alzheimer's disease with late onset: Secondary | ICD-10-CM | POA: Diagnosis not present

## 2023-03-07 DIAGNOSIS — F0280 Dementia in other diseases classified elsewhere without behavioral disturbance: Secondary | ICD-10-CM | POA: Diagnosis not present

## 2023-03-07 DIAGNOSIS — G311 Senile degeneration of brain, not elsewhere classified: Secondary | ICD-10-CM | POA: Diagnosis not present

## 2023-03-07 DIAGNOSIS — K219 Gastro-esophageal reflux disease without esophagitis: Secondary | ICD-10-CM | POA: Diagnosis not present

## 2023-03-07 DIAGNOSIS — E785 Hyperlipidemia, unspecified: Secondary | ICD-10-CM | POA: Diagnosis not present

## 2023-03-07 DIAGNOSIS — I10 Essential (primary) hypertension: Secondary | ICD-10-CM | POA: Diagnosis not present

## 2023-03-07 DIAGNOSIS — G301 Alzheimer's disease with late onset: Secondary | ICD-10-CM | POA: Diagnosis not present

## 2023-03-08 DIAGNOSIS — E785 Hyperlipidemia, unspecified: Secondary | ICD-10-CM | POA: Diagnosis not present

## 2023-03-08 DIAGNOSIS — I10 Essential (primary) hypertension: Secondary | ICD-10-CM | POA: Diagnosis not present

## 2023-03-08 DIAGNOSIS — G301 Alzheimer's disease with late onset: Secondary | ICD-10-CM | POA: Diagnosis not present

## 2023-03-08 DIAGNOSIS — F0280 Dementia in other diseases classified elsewhere without behavioral disturbance: Secondary | ICD-10-CM | POA: Diagnosis not present

## 2023-03-08 DIAGNOSIS — K219 Gastro-esophageal reflux disease without esophagitis: Secondary | ICD-10-CM | POA: Diagnosis not present

## 2023-03-08 DIAGNOSIS — G311 Senile degeneration of brain, not elsewhere classified: Secondary | ICD-10-CM | POA: Diagnosis not present

## 2023-03-09 DIAGNOSIS — G301 Alzheimer's disease with late onset: Secondary | ICD-10-CM | POA: Diagnosis not present

## 2023-03-09 DIAGNOSIS — G311 Senile degeneration of brain, not elsewhere classified: Secondary | ICD-10-CM | POA: Diagnosis not present

## 2023-03-09 DIAGNOSIS — E785 Hyperlipidemia, unspecified: Secondary | ICD-10-CM | POA: Diagnosis not present

## 2023-03-09 DIAGNOSIS — F0280 Dementia in other diseases classified elsewhere without behavioral disturbance: Secondary | ICD-10-CM | POA: Diagnosis not present

## 2023-03-09 DIAGNOSIS — K219 Gastro-esophageal reflux disease without esophagitis: Secondary | ICD-10-CM | POA: Diagnosis not present

## 2023-03-09 DIAGNOSIS — I10 Essential (primary) hypertension: Secondary | ICD-10-CM | POA: Diagnosis not present

## 2023-03-10 DIAGNOSIS — G301 Alzheimer's disease with late onset: Secondary | ICD-10-CM | POA: Diagnosis not present

## 2023-03-10 DIAGNOSIS — E785 Hyperlipidemia, unspecified: Secondary | ICD-10-CM | POA: Diagnosis not present

## 2023-03-10 DIAGNOSIS — F0280 Dementia in other diseases classified elsewhere without behavioral disturbance: Secondary | ICD-10-CM | POA: Diagnosis not present

## 2023-03-10 DIAGNOSIS — G311 Senile degeneration of brain, not elsewhere classified: Secondary | ICD-10-CM | POA: Diagnosis not present

## 2023-03-10 DIAGNOSIS — I10 Essential (primary) hypertension: Secondary | ICD-10-CM | POA: Diagnosis not present

## 2023-03-10 DIAGNOSIS — K219 Gastro-esophageal reflux disease without esophagitis: Secondary | ICD-10-CM | POA: Diagnosis not present

## 2023-03-11 DIAGNOSIS — I10 Essential (primary) hypertension: Secondary | ICD-10-CM | POA: Diagnosis not present

## 2023-03-11 DIAGNOSIS — E785 Hyperlipidemia, unspecified: Secondary | ICD-10-CM | POA: Diagnosis not present

## 2023-03-11 DIAGNOSIS — K219 Gastro-esophageal reflux disease without esophagitis: Secondary | ICD-10-CM | POA: Diagnosis not present

## 2023-03-11 DIAGNOSIS — G311 Senile degeneration of brain, not elsewhere classified: Secondary | ICD-10-CM | POA: Diagnosis not present

## 2023-03-11 DIAGNOSIS — G301 Alzheimer's disease with late onset: Secondary | ICD-10-CM | POA: Diagnosis not present

## 2023-03-11 DIAGNOSIS — F0280 Dementia in other diseases classified elsewhere without behavioral disturbance: Secondary | ICD-10-CM | POA: Diagnosis not present

## 2023-03-14 DIAGNOSIS — G311 Senile degeneration of brain, not elsewhere classified: Secondary | ICD-10-CM | POA: Diagnosis not present

## 2023-03-14 DIAGNOSIS — E785 Hyperlipidemia, unspecified: Secondary | ICD-10-CM | POA: Diagnosis not present

## 2023-03-14 DIAGNOSIS — F0280 Dementia in other diseases classified elsewhere without behavioral disturbance: Secondary | ICD-10-CM | POA: Diagnosis not present

## 2023-03-14 DIAGNOSIS — K219 Gastro-esophageal reflux disease without esophagitis: Secondary | ICD-10-CM | POA: Diagnosis not present

## 2023-03-14 DIAGNOSIS — G301 Alzheimer's disease with late onset: Secondary | ICD-10-CM | POA: Diagnosis not present

## 2023-03-14 DIAGNOSIS — I10 Essential (primary) hypertension: Secondary | ICD-10-CM | POA: Diagnosis not present

## 2023-03-15 DIAGNOSIS — E785 Hyperlipidemia, unspecified: Secondary | ICD-10-CM | POA: Diagnosis not present

## 2023-03-15 DIAGNOSIS — G301 Alzheimer's disease with late onset: Secondary | ICD-10-CM | POA: Diagnosis not present

## 2023-03-15 DIAGNOSIS — I10 Essential (primary) hypertension: Secondary | ICD-10-CM | POA: Diagnosis not present

## 2023-03-15 DIAGNOSIS — F0280 Dementia in other diseases classified elsewhere without behavioral disturbance: Secondary | ICD-10-CM | POA: Diagnosis not present

## 2023-03-15 DIAGNOSIS — G311 Senile degeneration of brain, not elsewhere classified: Secondary | ICD-10-CM | POA: Diagnosis not present

## 2023-03-15 DIAGNOSIS — K219 Gastro-esophageal reflux disease without esophagitis: Secondary | ICD-10-CM | POA: Diagnosis not present

## 2023-03-18 ENCOUNTER — Other Ambulatory Visit: Payer: Self-pay | Admitting: Nurse Practitioner

## 2023-03-18 DIAGNOSIS — E785 Hyperlipidemia, unspecified: Secondary | ICD-10-CM | POA: Diagnosis not present

## 2023-03-18 DIAGNOSIS — G311 Senile degeneration of brain, not elsewhere classified: Secondary | ICD-10-CM | POA: Diagnosis not present

## 2023-03-18 DIAGNOSIS — Z743 Need for continuous supervision: Secondary | ICD-10-CM | POA: Diagnosis not present

## 2023-03-18 DIAGNOSIS — G309 Alzheimer's disease, unspecified: Secondary | ICD-10-CM | POA: Diagnosis not present

## 2023-03-18 DIAGNOSIS — F0280 Dementia in other diseases classified elsewhere without behavioral disturbance: Secondary | ICD-10-CM | POA: Diagnosis not present

## 2023-03-18 DIAGNOSIS — G301 Alzheimer's disease with late onset: Secondary | ICD-10-CM | POA: Diagnosis not present

## 2023-03-18 DIAGNOSIS — K219 Gastro-esophageal reflux disease without esophagitis: Secondary | ICD-10-CM | POA: Diagnosis not present

## 2023-03-18 DIAGNOSIS — R32 Unspecified urinary incontinence: Secondary | ICD-10-CM

## 2023-03-18 DIAGNOSIS — I10 Essential (primary) hypertension: Secondary | ICD-10-CM | POA: Diagnosis not present

## 2023-03-19 DIAGNOSIS — I10 Essential (primary) hypertension: Secondary | ICD-10-CM | POA: Diagnosis not present

## 2023-03-19 DIAGNOSIS — E785 Hyperlipidemia, unspecified: Secondary | ICD-10-CM | POA: Diagnosis not present

## 2023-03-19 DIAGNOSIS — F0280 Dementia in other diseases classified elsewhere without behavioral disturbance: Secondary | ICD-10-CM | POA: Diagnosis not present

## 2023-03-19 DIAGNOSIS — G311 Senile degeneration of brain, not elsewhere classified: Secondary | ICD-10-CM | POA: Diagnosis not present

## 2023-03-19 DIAGNOSIS — G301 Alzheimer's disease with late onset: Secondary | ICD-10-CM | POA: Diagnosis not present

## 2023-03-19 DIAGNOSIS — K219 Gastro-esophageal reflux disease without esophagitis: Secondary | ICD-10-CM | POA: Diagnosis not present

## 2023-03-20 DIAGNOSIS — G301 Alzheimer's disease with late onset: Secondary | ICD-10-CM | POA: Diagnosis not present

## 2023-03-20 DIAGNOSIS — K219 Gastro-esophageal reflux disease without esophagitis: Secondary | ICD-10-CM | POA: Diagnosis not present

## 2023-03-20 DIAGNOSIS — E785 Hyperlipidemia, unspecified: Secondary | ICD-10-CM | POA: Diagnosis not present

## 2023-03-20 DIAGNOSIS — G311 Senile degeneration of brain, not elsewhere classified: Secondary | ICD-10-CM | POA: Diagnosis not present

## 2023-03-20 DIAGNOSIS — F0280 Dementia in other diseases classified elsewhere without behavioral disturbance: Secondary | ICD-10-CM | POA: Diagnosis not present

## 2023-03-20 DIAGNOSIS — I10 Essential (primary) hypertension: Secondary | ICD-10-CM | POA: Diagnosis not present

## 2023-03-21 DIAGNOSIS — E785 Hyperlipidemia, unspecified: Secondary | ICD-10-CM | POA: Diagnosis not present

## 2023-03-21 DIAGNOSIS — F0280 Dementia in other diseases classified elsewhere without behavioral disturbance: Secondary | ICD-10-CM | POA: Diagnosis not present

## 2023-03-21 DIAGNOSIS — K219 Gastro-esophageal reflux disease without esophagitis: Secondary | ICD-10-CM | POA: Diagnosis not present

## 2023-03-21 DIAGNOSIS — I10 Essential (primary) hypertension: Secondary | ICD-10-CM | POA: Diagnosis not present

## 2023-03-21 DIAGNOSIS — G301 Alzheimer's disease with late onset: Secondary | ICD-10-CM | POA: Diagnosis not present

## 2023-03-21 DIAGNOSIS — G311 Senile degeneration of brain, not elsewhere classified: Secondary | ICD-10-CM | POA: Diagnosis not present

## 2023-03-22 DIAGNOSIS — E785 Hyperlipidemia, unspecified: Secondary | ICD-10-CM | POA: Diagnosis not present

## 2023-03-22 DIAGNOSIS — G311 Senile degeneration of brain, not elsewhere classified: Secondary | ICD-10-CM | POA: Diagnosis not present

## 2023-03-22 DIAGNOSIS — F0280 Dementia in other diseases classified elsewhere without behavioral disturbance: Secondary | ICD-10-CM | POA: Diagnosis not present

## 2023-03-22 DIAGNOSIS — G301 Alzheimer's disease with late onset: Secondary | ICD-10-CM | POA: Diagnosis not present

## 2023-03-22 DIAGNOSIS — K219 Gastro-esophageal reflux disease without esophagitis: Secondary | ICD-10-CM | POA: Diagnosis not present

## 2023-03-22 DIAGNOSIS — I10 Essential (primary) hypertension: Secondary | ICD-10-CM | POA: Diagnosis not present

## 2023-03-23 DIAGNOSIS — G301 Alzheimer's disease with late onset: Secondary | ICD-10-CM | POA: Diagnosis not present

## 2023-03-23 DIAGNOSIS — I10 Essential (primary) hypertension: Secondary | ICD-10-CM | POA: Diagnosis not present

## 2023-03-23 DIAGNOSIS — K219 Gastro-esophageal reflux disease without esophagitis: Secondary | ICD-10-CM | POA: Diagnosis not present

## 2023-03-23 DIAGNOSIS — F0280 Dementia in other diseases classified elsewhere without behavioral disturbance: Secondary | ICD-10-CM | POA: Diagnosis not present

## 2023-03-23 DIAGNOSIS — G311 Senile degeneration of brain, not elsewhere classified: Secondary | ICD-10-CM | POA: Diagnosis not present

## 2023-03-23 DIAGNOSIS — E785 Hyperlipidemia, unspecified: Secondary | ICD-10-CM | POA: Diagnosis not present

## 2023-03-24 DIAGNOSIS — F0280 Dementia in other diseases classified elsewhere without behavioral disturbance: Secondary | ICD-10-CM | POA: Diagnosis not present

## 2023-03-24 DIAGNOSIS — G311 Senile degeneration of brain, not elsewhere classified: Secondary | ICD-10-CM | POA: Diagnosis not present

## 2023-03-24 DIAGNOSIS — G301 Alzheimer's disease with late onset: Secondary | ICD-10-CM | POA: Diagnosis not present

## 2023-03-24 DIAGNOSIS — E785 Hyperlipidemia, unspecified: Secondary | ICD-10-CM | POA: Diagnosis not present

## 2023-03-24 DIAGNOSIS — K219 Gastro-esophageal reflux disease without esophagitis: Secondary | ICD-10-CM | POA: Diagnosis not present

## 2023-03-24 DIAGNOSIS — I10 Essential (primary) hypertension: Secondary | ICD-10-CM | POA: Diagnosis not present

## 2023-03-25 DIAGNOSIS — G301 Alzheimer's disease with late onset: Secondary | ICD-10-CM | POA: Diagnosis not present

## 2023-03-25 DIAGNOSIS — K219 Gastro-esophageal reflux disease without esophagitis: Secondary | ICD-10-CM | POA: Diagnosis not present

## 2023-03-25 DIAGNOSIS — I10 Essential (primary) hypertension: Secondary | ICD-10-CM | POA: Diagnosis not present

## 2023-03-25 DIAGNOSIS — G311 Senile degeneration of brain, not elsewhere classified: Secondary | ICD-10-CM | POA: Diagnosis not present

## 2023-03-25 DIAGNOSIS — F0280 Dementia in other diseases classified elsewhere without behavioral disturbance: Secondary | ICD-10-CM | POA: Diagnosis not present

## 2023-03-25 DIAGNOSIS — E785 Hyperlipidemia, unspecified: Secondary | ICD-10-CM | POA: Diagnosis not present

## 2023-03-26 DIAGNOSIS — F0280 Dementia in other diseases classified elsewhere without behavioral disturbance: Secondary | ICD-10-CM | POA: Diagnosis not present

## 2023-03-26 DIAGNOSIS — G301 Alzheimer's disease with late onset: Secondary | ICD-10-CM | POA: Diagnosis not present

## 2023-03-26 DIAGNOSIS — H409 Unspecified glaucoma: Secondary | ICD-10-CM | POA: Diagnosis not present

## 2023-03-26 DIAGNOSIS — M1990 Unspecified osteoarthritis, unspecified site: Secondary | ICD-10-CM | POA: Diagnosis not present

## 2023-03-26 DIAGNOSIS — K219 Gastro-esophageal reflux disease without esophagitis: Secondary | ICD-10-CM | POA: Diagnosis not present

## 2023-03-26 DIAGNOSIS — D649 Anemia, unspecified: Secondary | ICD-10-CM | POA: Diagnosis not present

## 2023-03-26 DIAGNOSIS — K641 Second degree hemorrhoids: Secondary | ICD-10-CM | POA: Diagnosis not present

## 2023-03-26 DIAGNOSIS — G311 Senile degeneration of brain, not elsewhere classified: Secondary | ICD-10-CM | POA: Diagnosis not present

## 2023-03-26 DIAGNOSIS — I10 Essential (primary) hypertension: Secondary | ICD-10-CM | POA: Diagnosis not present

## 2023-03-26 DIAGNOSIS — E785 Hyperlipidemia, unspecified: Secondary | ICD-10-CM | POA: Diagnosis not present

## 2023-03-28 DIAGNOSIS — E785 Hyperlipidemia, unspecified: Secondary | ICD-10-CM | POA: Diagnosis not present

## 2023-03-28 DIAGNOSIS — F0280 Dementia in other diseases classified elsewhere without behavioral disturbance: Secondary | ICD-10-CM | POA: Diagnosis not present

## 2023-03-28 DIAGNOSIS — G301 Alzheimer's disease with late onset: Secondary | ICD-10-CM | POA: Diagnosis not present

## 2023-03-28 DIAGNOSIS — K219 Gastro-esophageal reflux disease without esophagitis: Secondary | ICD-10-CM | POA: Diagnosis not present

## 2023-03-28 DIAGNOSIS — G311 Senile degeneration of brain, not elsewhere classified: Secondary | ICD-10-CM | POA: Diagnosis not present

## 2023-03-28 DIAGNOSIS — I10 Essential (primary) hypertension: Secondary | ICD-10-CM | POA: Diagnosis not present

## 2023-03-30 DIAGNOSIS — I10 Essential (primary) hypertension: Secondary | ICD-10-CM | POA: Diagnosis not present

## 2023-03-30 DIAGNOSIS — G301 Alzheimer's disease with late onset: Secondary | ICD-10-CM | POA: Diagnosis not present

## 2023-03-30 DIAGNOSIS — G311 Senile degeneration of brain, not elsewhere classified: Secondary | ICD-10-CM | POA: Diagnosis not present

## 2023-03-30 DIAGNOSIS — K219 Gastro-esophageal reflux disease without esophagitis: Secondary | ICD-10-CM | POA: Diagnosis not present

## 2023-03-30 DIAGNOSIS — F0280 Dementia in other diseases classified elsewhere without behavioral disturbance: Secondary | ICD-10-CM | POA: Diagnosis not present

## 2023-03-30 DIAGNOSIS — E785 Hyperlipidemia, unspecified: Secondary | ICD-10-CM | POA: Diagnosis not present

## 2023-04-01 DIAGNOSIS — G311 Senile degeneration of brain, not elsewhere classified: Secondary | ICD-10-CM | POA: Diagnosis not present

## 2023-04-01 DIAGNOSIS — K219 Gastro-esophageal reflux disease without esophagitis: Secondary | ICD-10-CM | POA: Diagnosis not present

## 2023-04-01 DIAGNOSIS — I10 Essential (primary) hypertension: Secondary | ICD-10-CM | POA: Diagnosis not present

## 2023-04-01 DIAGNOSIS — F0280 Dementia in other diseases classified elsewhere without behavioral disturbance: Secondary | ICD-10-CM | POA: Diagnosis not present

## 2023-04-01 DIAGNOSIS — E785 Hyperlipidemia, unspecified: Secondary | ICD-10-CM | POA: Diagnosis not present

## 2023-04-01 DIAGNOSIS — G301 Alzheimer's disease with late onset: Secondary | ICD-10-CM | POA: Diagnosis not present

## 2023-04-04 DIAGNOSIS — G301 Alzheimer's disease with late onset: Secondary | ICD-10-CM | POA: Diagnosis not present

## 2023-04-04 DIAGNOSIS — G311 Senile degeneration of brain, not elsewhere classified: Secondary | ICD-10-CM | POA: Diagnosis not present

## 2023-04-04 DIAGNOSIS — E785 Hyperlipidemia, unspecified: Secondary | ICD-10-CM | POA: Diagnosis not present

## 2023-04-04 DIAGNOSIS — I10 Essential (primary) hypertension: Secondary | ICD-10-CM | POA: Diagnosis not present

## 2023-04-04 DIAGNOSIS — K219 Gastro-esophageal reflux disease without esophagitis: Secondary | ICD-10-CM | POA: Diagnosis not present

## 2023-04-04 DIAGNOSIS — F0280 Dementia in other diseases classified elsewhere without behavioral disturbance: Secondary | ICD-10-CM | POA: Diagnosis not present

## 2023-04-05 DIAGNOSIS — K219 Gastro-esophageal reflux disease without esophagitis: Secondary | ICD-10-CM | POA: Diagnosis not present

## 2023-04-05 DIAGNOSIS — I10 Essential (primary) hypertension: Secondary | ICD-10-CM | POA: Diagnosis not present

## 2023-04-05 DIAGNOSIS — G311 Senile degeneration of brain, not elsewhere classified: Secondary | ICD-10-CM | POA: Diagnosis not present

## 2023-04-05 DIAGNOSIS — F0280 Dementia in other diseases classified elsewhere without behavioral disturbance: Secondary | ICD-10-CM | POA: Diagnosis not present

## 2023-04-05 DIAGNOSIS — E785 Hyperlipidemia, unspecified: Secondary | ICD-10-CM | POA: Diagnosis not present

## 2023-04-05 DIAGNOSIS — G301 Alzheimer's disease with late onset: Secondary | ICD-10-CM | POA: Diagnosis not present

## 2023-04-06 DIAGNOSIS — G311 Senile degeneration of brain, not elsewhere classified: Secondary | ICD-10-CM | POA: Diagnosis not present

## 2023-04-06 DIAGNOSIS — G301 Alzheimer's disease with late onset: Secondary | ICD-10-CM | POA: Diagnosis not present

## 2023-04-06 DIAGNOSIS — E785 Hyperlipidemia, unspecified: Secondary | ICD-10-CM | POA: Diagnosis not present

## 2023-04-06 DIAGNOSIS — I10 Essential (primary) hypertension: Secondary | ICD-10-CM | POA: Diagnosis not present

## 2023-04-06 DIAGNOSIS — K219 Gastro-esophageal reflux disease without esophagitis: Secondary | ICD-10-CM | POA: Diagnosis not present

## 2023-04-06 DIAGNOSIS — F0280 Dementia in other diseases classified elsewhere without behavioral disturbance: Secondary | ICD-10-CM | POA: Diagnosis not present

## 2023-04-07 DIAGNOSIS — I10 Essential (primary) hypertension: Secondary | ICD-10-CM | POA: Diagnosis not present

## 2023-04-07 DIAGNOSIS — E785 Hyperlipidemia, unspecified: Secondary | ICD-10-CM | POA: Diagnosis not present

## 2023-04-07 DIAGNOSIS — K219 Gastro-esophageal reflux disease without esophagitis: Secondary | ICD-10-CM | POA: Diagnosis not present

## 2023-04-07 DIAGNOSIS — G301 Alzheimer's disease with late onset: Secondary | ICD-10-CM | POA: Diagnosis not present

## 2023-04-07 DIAGNOSIS — F0280 Dementia in other diseases classified elsewhere without behavioral disturbance: Secondary | ICD-10-CM | POA: Diagnosis not present

## 2023-04-07 DIAGNOSIS — G311 Senile degeneration of brain, not elsewhere classified: Secondary | ICD-10-CM | POA: Diagnosis not present

## 2023-04-08 DIAGNOSIS — E785 Hyperlipidemia, unspecified: Secondary | ICD-10-CM | POA: Diagnosis not present

## 2023-04-08 DIAGNOSIS — F0280 Dementia in other diseases classified elsewhere without behavioral disturbance: Secondary | ICD-10-CM | POA: Diagnosis not present

## 2023-04-08 DIAGNOSIS — I10 Essential (primary) hypertension: Secondary | ICD-10-CM | POA: Diagnosis not present

## 2023-04-08 DIAGNOSIS — G311 Senile degeneration of brain, not elsewhere classified: Secondary | ICD-10-CM | POA: Diagnosis not present

## 2023-04-08 DIAGNOSIS — G301 Alzheimer's disease with late onset: Secondary | ICD-10-CM | POA: Diagnosis not present

## 2023-04-08 DIAGNOSIS — K219 Gastro-esophageal reflux disease without esophagitis: Secondary | ICD-10-CM | POA: Diagnosis not present

## 2023-04-09 DIAGNOSIS — F0280 Dementia in other diseases classified elsewhere without behavioral disturbance: Secondary | ICD-10-CM | POA: Diagnosis not present

## 2023-04-09 DIAGNOSIS — G311 Senile degeneration of brain, not elsewhere classified: Secondary | ICD-10-CM | POA: Diagnosis not present

## 2023-04-09 DIAGNOSIS — E785 Hyperlipidemia, unspecified: Secondary | ICD-10-CM | POA: Diagnosis not present

## 2023-04-09 DIAGNOSIS — K219 Gastro-esophageal reflux disease without esophagitis: Secondary | ICD-10-CM | POA: Diagnosis not present

## 2023-04-09 DIAGNOSIS — G301 Alzheimer's disease with late onset: Secondary | ICD-10-CM | POA: Diagnosis not present

## 2023-04-09 DIAGNOSIS — I10 Essential (primary) hypertension: Secondary | ICD-10-CM | POA: Diagnosis not present

## 2023-04-10 DIAGNOSIS — I10 Essential (primary) hypertension: Secondary | ICD-10-CM | POA: Diagnosis not present

## 2023-04-10 DIAGNOSIS — G301 Alzheimer's disease with late onset: Secondary | ICD-10-CM | POA: Diagnosis not present

## 2023-04-10 DIAGNOSIS — G311 Senile degeneration of brain, not elsewhere classified: Secondary | ICD-10-CM | POA: Diagnosis not present

## 2023-04-10 DIAGNOSIS — F0280 Dementia in other diseases classified elsewhere without behavioral disturbance: Secondary | ICD-10-CM | POA: Diagnosis not present

## 2023-04-10 DIAGNOSIS — E785 Hyperlipidemia, unspecified: Secondary | ICD-10-CM | POA: Diagnosis not present

## 2023-04-10 DIAGNOSIS — K219 Gastro-esophageal reflux disease without esophagitis: Secondary | ICD-10-CM | POA: Diagnosis not present

## 2023-04-11 ENCOUNTER — Ambulatory Visit: Payer: Self-pay

## 2023-04-11 DIAGNOSIS — E785 Hyperlipidemia, unspecified: Secondary | ICD-10-CM | POA: Diagnosis not present

## 2023-04-11 DIAGNOSIS — F0280 Dementia in other diseases classified elsewhere without behavioral disturbance: Secondary | ICD-10-CM | POA: Diagnosis not present

## 2023-04-11 DIAGNOSIS — K219 Gastro-esophageal reflux disease without esophagitis: Secondary | ICD-10-CM | POA: Diagnosis not present

## 2023-04-11 DIAGNOSIS — I10 Essential (primary) hypertension: Secondary | ICD-10-CM | POA: Diagnosis not present

## 2023-04-11 DIAGNOSIS — G311 Senile degeneration of brain, not elsewhere classified: Secondary | ICD-10-CM | POA: Diagnosis not present

## 2023-04-11 DIAGNOSIS — G301 Alzheimer's disease with late onset: Secondary | ICD-10-CM | POA: Diagnosis not present

## 2023-04-11 NOTE — Patient Outreach (Signed)
 Care Coordination   Follow Up Visit Note   04/11/2023 Name: Tammy [name] MRN: [id number] DOB: [birth year]  Tammy [name] is a 89 y.o. year old female who sees Arnette Felts, FNP for primary care. I spoke with nice [name] by phone today.  What matters to the patients health and wellness today?  Patient's niece would like to transport patient back to her home for End of Life.     Goals Addressed             This Visit's Progress    To receive Hospice for comfort care       Care Coordination Interventions: Completed successful outbound call with patient's POA and niece [name] Evaluation of current treatment plan related to dementia w/worsening physical and mental decline and patient's adherence to plan as established by provider Determined patient continues to be under the care of Hospice, she appears to be transitioning towards End of Life per niece [name] Determined patient is currently inpatient for respite care, she may return to niece [name]'s home before End of Life if stable Active listening / Reflection utilized  Industrial/product designer Provided  Sent in basket message to [employer] with a patient status update     Interventions Today    Flowsheet Row Most Recent Value  Chronic Disease   Chronic disease during today's visit Other  [Demenita]  General Interventions   General Interventions Discussed/Reviewed General Interventions Discussed, General Interventions Reviewed, Communication with  Communication with Social Work  Jenel Lucks LCSW]  Advanced Directive Interventions   Advanced Directives Discussed/Reviewed End of Life  End of Life Hospice          SDOH assessments and interventions completed:  No     Care Coordination Interventions:  Yes, provided   Follow up plan: Follow up call scheduled for 05/10/23 @11 :30 AM    Encounter Outcome:  Patient Visit Completed

## 2023-04-11 NOTE — Patient Instructions (Signed)
 Visit Information  Thank you for taking time to visit with me today. Please don't hesitate to contact me if I can be of assistance to you.   Following are the goals we discussed today:   Goals Addressed             This Visit's Progress    To receive Hospice for comfort care       Care Coordination Interventions: Completed successful outbound call with patient's POA and niece [name] Evaluation of current treatment plan related to dementia w/worsening physical and mental decline and patient's adherence to plan as established by provider Determined patient continues to be under the care of Hospice, she appears to be transitioning towards End of Life per niece [name] Determined patient is currently inpatient for respite care, she may return to niece [name]'s home before End of Life if stable Active listening / Reflection utilized  Emotional Support Provided  Sent in basket message to LCSW Jenel Lucks with a patient status update         Our next appointment is by telephone on 05/10/23 at 11:30 AM  Please call the care guide team at [phone] if you need to cancel or reschedule your appointment.   If you are experiencing a Mental Health or Behavioral Health Crisis or need someone to talk to, please call 1-800-273-TALK (toll free, 24 hour hotline)  Patient verbalizes understanding of instructions and care plan provided today and agrees to view in MyChart. Active MyChart status and patient understanding of how to access instructions and care plan via MyChart confirmed with patient.     Delsa Sale RN BSN CCM [place]  [facility], [facility] Health Nurse Care Coordinator  Direct Dial: [phone] Website: [email or web address]

## 2023-04-12 DIAGNOSIS — G301 Alzheimer's disease with late onset: Secondary | ICD-10-CM | POA: Diagnosis not present

## 2023-04-12 DIAGNOSIS — F0280 Dementia in other diseases classified elsewhere without behavioral disturbance: Secondary | ICD-10-CM | POA: Diagnosis not present

## 2023-04-12 DIAGNOSIS — I10 Essential (primary) hypertension: Secondary | ICD-10-CM | POA: Diagnosis not present

## 2023-04-12 DIAGNOSIS — G311 Senile degeneration of brain, not elsewhere classified: Secondary | ICD-10-CM | POA: Diagnosis not present

## 2023-04-12 DIAGNOSIS — K219 Gastro-esophageal reflux disease without esophagitis: Secondary | ICD-10-CM | POA: Diagnosis not present

## 2023-04-12 DIAGNOSIS — E785 Hyperlipidemia, unspecified: Secondary | ICD-10-CM | POA: Diagnosis not present

## 2023-04-13 DIAGNOSIS — E785 Hyperlipidemia, unspecified: Secondary | ICD-10-CM | POA: Diagnosis not present

## 2023-04-13 DIAGNOSIS — F0280 Dementia in other diseases classified elsewhere without behavioral disturbance: Secondary | ICD-10-CM | POA: Diagnosis not present

## 2023-04-13 DIAGNOSIS — G301 Alzheimer's disease with late onset: Secondary | ICD-10-CM | POA: Diagnosis not present

## 2023-04-13 DIAGNOSIS — K219 Gastro-esophageal reflux disease without esophagitis: Secondary | ICD-10-CM | POA: Diagnosis not present

## 2023-04-13 DIAGNOSIS — I10 Essential (primary) hypertension: Secondary | ICD-10-CM | POA: Diagnosis not present

## 2023-04-13 DIAGNOSIS — G311 Senile degeneration of brain, not elsewhere classified: Secondary | ICD-10-CM | POA: Diagnosis not present

## 2023-04-15 ENCOUNTER — Encounter: Payer: Self-pay | Admitting: Nurse Practitioner

## 2023-04-20 ENCOUNTER — Ambulatory Visit: Payer: Self-pay

## 2023-04-20 NOTE — Telephone Encounter (Signed)
 Called niece [name] to express my condolences. She mentioned the patient took a turn for the worse on last Wednesday and was at the [facility] facility, but was transferred home due to the patient and family wanted her home. She passed within two hours.

## 2023-04-20 NOTE — Patient Outreach (Signed)
 Care Coordination   Follow Up Visit Note   04/20/2023 Name: Tammy [name] MRN: [id number] DOB: November 28, 1933  Tammy [name] is a 89 y.o. year old female who sees Arnette Felts, FNP for primary care. I received a voice message from niece [name].   What matters to the patients health and wellness today?  NA    Goals Addressed             This Visit's Progress    COMPLETED: To receive Hospice for comfort care       Care Coordination Interventions: Received inbound call from niece [name], voice message stated Ms. Brosseau passed away at home on May 03, 2023, [name] stated her aunt had a peaceful passing with her family at her side Sent a secure message to Jenel Lucks LCSW and PCP Arnette Felts FNP to advise of patient's passing    Interventions Today    Flowsheet Row Most Recent Value  General Interventions   General Interventions Discussed/Reviewed Communication with  Communication with Social Work, PCP/Specialists  Arnette Felts FNP,  Jenel Lucks LCSW]  Advanced Directive Interventions   Advanced Directives Discussed/Reviewed End of Life  End of Life Hospice          SDOH assessments and interventions completed:  No     Care Coordination Interventions:  Yes, provided   Follow up plan: No further intervention required.   Encounter Outcome:  Patient Visit Completed

## 2023-04-26 DEATH — deceased

## 2023-05-02 ENCOUNTER — Encounter: Payer: Self-pay | Admitting: Licensed Clinical Social Worker
